# Patient Record
Sex: Male | Born: 1955 | Race: White | Hispanic: No | Marital: Married | Smoking: Never smoker
Health system: Southern US, Community
[De-identification: ages and names within clinical notes are randomized; demographics above are authoritative.]

## PROBLEM LIST (undated history)

## (undated) DIAGNOSIS — K219 Gastro-esophageal reflux disease without esophagitis: Secondary | ICD-10-CM

## (undated) DIAGNOSIS — H9313 Tinnitus, bilateral: Secondary | ICD-10-CM

## (undated) DIAGNOSIS — I10 Essential (primary) hypertension: Secondary | ICD-10-CM

## (undated) DIAGNOSIS — D126 Benign neoplasm of colon, unspecified: Secondary | ICD-10-CM

## (undated) HISTORY — DX: Essential (primary) hypertension: I10

## (undated) HISTORY — DX: Benign neoplasm of colon, unspecified: D12.6

## (undated) HISTORY — DX: Gastro-esophageal reflux disease without esophagitis: K21.9

## (undated) HISTORY — DX: Tinnitus, bilateral: H93.13

## (undated) HISTORY — PX: COLONOSCOPY: SHX174

---
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
## 2008-03-26 ENCOUNTER — Ambulatory Visit: Payer: Self-pay | Admitting: Gastroenterology

## 2008-04-07 ENCOUNTER — Encounter: Payer: Self-pay | Admitting: Gastroenterology

## 2008-04-07 ENCOUNTER — Ambulatory Visit: Payer: Self-pay | Admitting: Gastroenterology

## 2008-04-17 ENCOUNTER — Telehealth: Payer: Self-pay | Admitting: Gastroenterology

## 2008-05-28 DIAGNOSIS — Z860100 Personal history of colon polyps, unspecified: Secondary | ICD-10-CM | POA: Insufficient documentation

## 2008-05-28 DIAGNOSIS — K219 Gastro-esophageal reflux disease without esophagitis: Secondary | ICD-10-CM

## 2008-05-28 DIAGNOSIS — K5289 Other specified noninfective gastroenteritis and colitis: Secondary | ICD-10-CM | POA: Insufficient documentation

## 2008-05-29 ENCOUNTER — Ambulatory Visit: Payer: Self-pay | Admitting: Gastroenterology

## 2008-06-16 ENCOUNTER — Encounter (INDEPENDENT_AMBULATORY_CARE_PROVIDER_SITE_OTHER): Payer: Self-pay | Admitting: *Deleted

## 2009-02-19 ENCOUNTER — Encounter (INDEPENDENT_AMBULATORY_CARE_PROVIDER_SITE_OTHER): Payer: Self-pay | Admitting: *Deleted

## 2010-02-24 ENCOUNTER — Encounter (INDEPENDENT_AMBULATORY_CARE_PROVIDER_SITE_OTHER): Payer: Self-pay | Admitting: *Deleted

## 2011-01-18 NOTE — Letter (Signed)
Summary: Colonoscopy Letter  [facility]  [address] [place], [place] [address]   Phone: [phone]  Fax: [phone]      February 24, 2010 MRN: [id number]   [facility] [address] [place], [place]  [address]   Dear Mr. [facility],   According to your medical record, it is time for you to schedule a Colonoscopy. The American Cancer Society recommends this procedure as a method to detect early colon cancer. Patients with a family history of colon cancer, or a personal history of colon polyps or inflammatory bowel disease are at increased risk.  This letter has beeen generated based on the recommendations made at the time of your procedure. If you feel that in your particular situation this may no longer apply, please contact our office.  Please call our office at [phone] to schedule this appointment or to update your records at your earliest convenience.  Thank you for cooperating with [name] to provide you with the very best care possible.   Sincerely,   Vania Rea. Jarold Motto, M.D.  [facility] [phone]

## 2011-07-04 ENCOUNTER — Encounter: Payer: Self-pay | Admitting: *Deleted

## 2011-07-04 ENCOUNTER — Telehealth: Payer: Self-pay | Admitting: *Deleted

## 2011-07-04 NOTE — Telephone Encounter (Signed)
Colon scheduled for 08/15/2011 and pre visit on 07/25/2011, mailed pt information.

## 2011-07-25 ENCOUNTER — Ambulatory Visit (AMBULATORY_SURGERY_CENTER): Payer: BC Managed Care – PPO | Admitting: *Deleted

## 2011-07-25 ENCOUNTER — Encounter: Payer: Self-pay | Admitting: Gastroenterology

## 2011-07-25 VITALS — Ht 72.0 in | Wt 263.0 lb

## 2011-07-25 DIAGNOSIS — Z1211 Encounter for screening for malignant neoplasm of colon: Secondary | ICD-10-CM

## 2011-07-25 MED ORDER — PEG-KCL-NACL-NASULF-NA ASC-C 100 G PO SOLR: ORAL | Status: DC

## 2011-08-15 ENCOUNTER — Other Ambulatory Visit: Payer: Self-pay | Admitting: Gastroenterology

## 2011-08-24 ENCOUNTER — Encounter: Payer: Self-pay | Admitting: Gastroenterology

## 2011-08-24 ENCOUNTER — Ambulatory Visit (AMBULATORY_SURGERY_CENTER): Payer: BC Managed Care – PPO | Admitting: Gastroenterology

## 2011-08-24 VITALS — BP 114/59 | HR 69 | Temp 96.9°F | Resp 20 | Ht 72.0 in | Wt 248.0 lb

## 2011-08-24 DIAGNOSIS — D126 Benign neoplasm of colon, unspecified: Secondary | ICD-10-CM

## 2011-08-24 DIAGNOSIS — Z86010 Personal history of colonic polyps: Secondary | ICD-10-CM

## 2011-08-24 DIAGNOSIS — D129 Benign neoplasm of anus and anal canal: Secondary | ICD-10-CM

## 2011-08-24 DIAGNOSIS — Z1211 Encounter for screening for malignant neoplasm of colon: Secondary | ICD-10-CM

## 2011-08-24 DIAGNOSIS — D128 Benign neoplasm of rectum: Secondary | ICD-10-CM

## 2011-08-24 MED ORDER — SODIUM CHLORIDE 0.9 % IV SOLN: 500.0000 mL | INTRAVENOUS | Status: DC

## 2011-08-24 NOTE — Patient Instructions (Signed)
FOLLOW DISCHARGE INSTRUCTIONS (BLUE & GREEN SHEETS)   INFORMATION ON POLYPS GIVEN TO YOU.  

## 2011-08-25 ENCOUNTER — Telehealth: Payer: Self-pay

## 2011-08-25 NOTE — Telephone Encounter (Signed)

## 2011-08-30 ENCOUNTER — Encounter: Payer: Self-pay | Admitting: Gastroenterology

## 2012-01-18 ENCOUNTER — Ambulatory Visit (INDEPENDENT_AMBULATORY_CARE_PROVIDER_SITE_OTHER): Payer: BC Managed Care – PPO | Admitting: Internal Medicine

## 2012-01-18 ENCOUNTER — Encounter: Payer: Self-pay | Admitting: Internal Medicine

## 2012-01-18 VITALS — BP 143/85 | HR 84 | Temp 97.8°F | Resp 20 | Ht 71.5 in | Wt 251.6 lb

## 2012-01-18 DIAGNOSIS — G4700 Insomnia, unspecified: Secondary | ICD-10-CM

## 2012-01-18 DIAGNOSIS — Z0000 Encounter for general adult medical examination without abnormal findings: Secondary | ICD-10-CM

## 2012-01-18 DIAGNOSIS — E669 Obesity, unspecified: Secondary | ICD-10-CM

## 2012-01-18 DIAGNOSIS — H9319 Tinnitus, unspecified ear: Secondary | ICD-10-CM | POA: Insufficient documentation

## 2012-01-18 DIAGNOSIS — I10 Essential (primary) hypertension: Secondary | ICD-10-CM

## 2012-01-18 LAB — HEMOCCULT GUIAC POC 1CARD (OFFICE): Fecal Occult Blood, POC: POSITIVE

## 2012-01-18 LAB — CBC WITH DIFFERENTIAL/PLATELET
Basophils Relative: 0 % (ref 0–1)
Eosinophils Absolute: 0.1 10*3/uL (ref 0.0–0.7)
HCT: 45.9 % (ref 39.0–52.0)
Hemoglobin: 15.9 g/dL (ref 13.0–17.0)
MCH: 32.2 pg (ref 26.0–34.0)
MCHC: 34.6 g/dL (ref 30.0–36.0)
Monocytes Absolute: 0.6 10*3/uL (ref 0.1–1.0)
Monocytes Relative: 8 % (ref 3–12)
Neutro Abs: 4.4 10*3/uL (ref 1.7–7.7)

## 2012-01-18 MED ORDER — ZOLPIDEM TARTRATE 10 MG PO TABS: 10.0000 mg | ORAL_TABLET | ORAL | Status: DC | PRN

## 2012-01-18 MED ORDER — LISINOPRIL-HYDROCHLOROTHIAZIDE 10-12.5 MG PO TABS: 1.0000 | ORAL_TABLET | Freq: Every day | ORAL | Status: DC

## 2012-01-18 NOTE — Progress Notes (Signed)
Addended by: Johnnette Litter on: 01/18/2012 04:26 PM   Modules accepted: Orders

## 2012-01-18 NOTE — Progress Notes (Signed)
  Subjective:    Patient ID: Warren Hill, male    DOB: [birth year], 56 y.o.   MRN: [id number]  HPI Routine f/u doing well except only 10 lbs wtloss. Problems stable. Lumps on scalp. Off Omep via diet changes. Uses Ambien to reset sleep on weekends cause travels every week. Tinnitus driving him crazy(Dr. Dorma Russell). Home bps are wnl.   Review of Systems  HENT: Positive for tinnitus.   Respiratory: Negative for shortness of breath.   Cardiovascular: Negative for chest pain, palpitations and leg swelling.  Gastrointestinal: Negative for diarrhea, constipation and blood in stool.  All other systems reviewed and are negative.       Objective:   Physical Exam  Constitutional: He is oriented to person, place, and time. He appears well-developed and well-nourished.  HENT:  Head: Normocephalic.  Right Ear: External ear normal.  Left Ear: External ear normal.  Nose: Nose normal.  Mouth/Throat: Oropharynx is clear and moist.  Eyes: Conjunctivae and EOM are normal. Pupils are equal, round, and reactive to light.  Neck: Normal range of motion. Neck supple. No thyromegaly present.  Cardiovascular: Normal rate, regular rhythm, normal heart sounds and intact distal pulses.   No murmur heard. Pulmonary/Chest: Breath sounds normal.  Abdominal: Soft. Bowel sounds are normal. He exhibits no mass. There is no tenderness.  Genitourinary: Rectum normal, prostate normal and penis normal.  Musculoskeletal: Normal range of motion.  Neurological: He is alert and oriented to person, place, and time. He has normal reflexes.  Skin: Skin is warm and dry.   2 cysts are on scalp and stable Remains overweight  BP 143/85  Pulse 84  Temp(Src) 97.8 F (36.6 C) (Oral)  Resp 20  Ht 5' 11.5" (1.816 m)  Wt 251 lb 9.6 oz (114.125 kg)  BMI 34.60 kg/m2       Assessment & Plan:  CPE stable  htn  gerd resolved  Tinnitus-to [facility]  Scalp cysts-follow  Insomnia  Obesity-LOSE!  Hx macular degen  Hx  psoriasis Plan routine labs--no change in meds

## 2012-01-18 NOTE — Progress Notes (Signed)
Addended by: Johnnette Litter on: 01/18/2012 04:37 PM   Modules accepted: Orders

## 2012-01-19 ENCOUNTER — Encounter: Payer: Self-pay | Admitting: Internal Medicine

## 2012-01-19 LAB — COMPREHENSIVE METABOLIC PANEL
ALT: 31 U/L (ref 0–53)
CO2: 26 mEq/L (ref 19–32)
Calcium: 9.7 mg/dL (ref 8.4–10.5)
Chloride: 100 mEq/L (ref 96–112)
Creat: 0.87 mg/dL (ref 0.50–1.35)
Glucose, Bld: 83 mg/dL (ref 70–99)
Total Bilirubin: 1.1 mg/dL (ref 0.3–1.2)
Total Protein: 7.6 g/dL (ref 6.0–8.3)

## 2012-01-19 LAB — LIPID PANEL
Cholesterol: 219 mg/dL — ABNORMAL HIGH (ref 0–200)
HDL: 47 mg/dL (ref >39)
Total CHOL/HDL Ratio: 4.7 Ratio
VLDL: 54 mg/dL — ABNORMAL HIGH (ref 0–40)

## 2012-08-28 ENCOUNTER — Other Ambulatory Visit: Payer: Self-pay | Admitting: Family Medicine

## 2012-08-28 DIAGNOSIS — G4700 Insomnia, unspecified: Secondary | ICD-10-CM

## 2012-08-28 MED ORDER — ZOLPIDEM TARTRATE 10 MG PO TABS: 10.0000 mg | ORAL_TABLET | ORAL | Status: DC | PRN

## 2012-10-03 ENCOUNTER — Ambulatory Visit (INDEPENDENT_AMBULATORY_CARE_PROVIDER_SITE_OTHER): Payer: BC Managed Care – PPO | Admitting: Family Medicine

## 2012-10-03 VITALS — BP 108/72 | HR 91 | Temp 98.0°F | Resp 16 | Ht 72.0 in | Wt 265.0 lb

## 2012-10-03 DIAGNOSIS — M940 Chondrocostal junction syndrome [Tietze]: Secondary | ICD-10-CM

## 2012-10-03 DIAGNOSIS — J411 Mucopurulent chronic bronchitis: Secondary | ICD-10-CM

## 2012-10-03 DIAGNOSIS — R292 Abnormal reflex: Secondary | ICD-10-CM

## 2012-10-03 MED ORDER — PANTOPRAZOLE SODIUM 40 MG PO TBEC: 40.0000 mg | DELAYED_RELEASE_TABLET | Freq: Every day | ORAL | Status: DC

## 2012-10-03 MED ORDER — ACETAMINOPHEN 500 MG PO TABS: 1000.0000 mg | ORAL_TABLET | Freq: Three times a day (TID) | ORAL | Status: DC | PRN

## 2012-10-03 NOTE — Progress Notes (Signed)
  Subjective:    Patient ID: Warren Hill, male    DOB: 04-30-1956, 56 y.o.   MRN: [id number]  HPI Pt presents today with chest discomfort x 1 week.  Predominantly R sided, sometimes associated with deep breathing and movement.  Pt reports moving some heavy furniture 1-2 weeks ago. Has had similar sxs in the past.  No radiation to L side of chest. No SOB or diaphoresis.   No prior hx/o CAD or MI. No family hx/o MI.  Has also had some indigestion and reflux sxs.  Previously treated for reflux in the past. However, has been off of medication for extended period of time.  Pt does travel regularly and has somewhat of bad diet.     Review of Systems See HPI, otherwise ROS negative     Objective:   Physical Exam Gen: up in chair, NAD HEENT: NCAT, EOMI, TMs clear bilaterally CV: RRR, no murmurs auscultated, + TTP across R anterior chest wall.  PULM: CTAB, no wheezes, rales, rhoncii ABD: S/NT/+ bowel sounds, obese abdomen, no epigastric tenderness EXT: 2+ peripheral pulses         Assessment & Plan:  Chest discomfort:  Most consistent with costochondritis. Will treat with tylenol. Avoid NSAIDs given underlying reflux. Discussed cardiorespiratory red flags including SOB, worsening CP.  Wells score 0.   Reflux: Will restart on ppi.  Discussed improved diet.

## 2012-10-09 ENCOUNTER — Encounter: Payer: Self-pay | Admitting: Family Medicine

## 2012-12-14 ENCOUNTER — Telehealth: Payer: Self-pay | Admitting: *Deleted

## 2012-12-14 DIAGNOSIS — G4700 Insomnia, unspecified: Secondary | ICD-10-CM

## 2012-12-14 NOTE — Telephone Encounter (Signed)
Pharmacy requesting refill on Ambien 10mg

## 2012-12-16 MED ORDER — ZOLPIDEM TARTRATE 10 MG PO TABS: 10.0000 mg | ORAL_TABLET | ORAL | Status: DC | PRN

## 2012-12-16 NOTE — Telephone Encounter (Signed)
Meds ordered this encounter  Medications  . zolpidem (AMBIEN) 10 MG tablet    Sig: Take 1 tablet (10 mg total) by mouth as needed. sleep    Dispense:  30 tablet    Refill:  0

## 2012-12-21 ENCOUNTER — Other Ambulatory Visit: Payer: Self-pay | Admitting: Internal Medicine

## 2013-01-13 ENCOUNTER — Other Ambulatory Visit: Payer: Self-pay | Admitting: Internal Medicine

## 2013-02-11 ENCOUNTER — Other Ambulatory Visit: Payer: Self-pay | Admitting: Internal Medicine

## 2013-02-11 NOTE — Telephone Encounter (Signed)
Forward to Dr. Merla Riches.

## 2013-02-15 ENCOUNTER — Other Ambulatory Visit: Payer: Self-pay | Admitting: Physician Assistant

## 2013-03-29 ENCOUNTER — Ambulatory Visit (INDEPENDENT_AMBULATORY_CARE_PROVIDER_SITE_OTHER): Payer: BC Managed Care – PPO | Admitting: Internal Medicine

## 2013-03-29 VITALS — BP 124/68 | HR 78 | Temp 97.8°F | Resp 16 | Ht 72.25 in | Wt 253.4 lb

## 2013-03-29 DIAGNOSIS — L989 Disorder of the skin and subcutaneous tissue, unspecified: Secondary | ICD-10-CM

## 2013-03-29 DIAGNOSIS — I10 Essential (primary) hypertension: Secondary | ICD-10-CM

## 2013-03-29 DIAGNOSIS — G4700 Insomnia, unspecified: Secondary | ICD-10-CM

## 2013-03-29 DIAGNOSIS — S46212A Strain of muscle, fascia and tendon of other parts of biceps, left arm, initial encounter: Secondary | ICD-10-CM

## 2013-03-29 MED ORDER — ZOLPIDEM TARTRATE 10 MG PO TABS: ORAL_TABLET | ORAL | Status: DC

## 2013-03-29 MED ORDER — LISINOPRIL-HYDROCHLOROTHIAZIDE 10-12.5 MG PO TABS: 1.0000 | ORAL_TABLET | Freq: Every day | ORAL | Status: DC

## 2013-03-29 NOTE — Progress Notes (Signed)
  Subjective:    Patient ID: Warren Hill, male    DOB: 04/18/1956, 57 y.o.   MRN: [id number]  HPI almost out of meds Physical in June Still needs Ambien because of travel schedule but does well Doing well with blood pressure Able to discontinue Protonix after he went on a diet avoiding all white foods including beer/reflux has resolved  New skin lesion on face x6 months Working in the yard and left biceps no sore    Review of Systems Negative    Objective:   Physical Exam Blood pressure 124/68 Hyperpigmented lesion left temporal area? Superficial spreading melanoma Tender in the left biceps without defect       Assessment & Plan:  HTN (hypertension) - Plan: lisinopril-hydrochlorothiazide (PRINZIDE,ZESTORETIC) 10-12.5 MG per tablet  Insomnia - Plan: zolpidem (AMBIEN) 10 MG tablet  Skin lesion of face--referred to Dr. Burnis Kingfisher strain, left, initial encounter//home physical therapy  cpe june

## 2013-06-05 ENCOUNTER — Encounter: Payer: Self-pay | Admitting: Internal Medicine

## 2013-06-05 ENCOUNTER — Ambulatory Visit (INDEPENDENT_AMBULATORY_CARE_PROVIDER_SITE_OTHER): Payer: BC Managed Care – PPO | Admitting: Internal Medicine

## 2013-06-05 VITALS — BP 108/64 | HR 80 | Temp 98.2°F | Resp 16 | Ht 71.5 in | Wt 244.0 lb

## 2013-06-05 DIAGNOSIS — I10 Essential (primary) hypertension: Secondary | ICD-10-CM

## 2013-06-05 DIAGNOSIS — M25512 Pain in left shoulder: Secondary | ICD-10-CM

## 2013-06-05 DIAGNOSIS — L409 Psoriasis, unspecified: Secondary | ICD-10-CM

## 2013-06-05 DIAGNOSIS — Z0000 Encounter for general adult medical examination without abnormal findings: Secondary | ICD-10-CM

## 2013-06-05 DIAGNOSIS — H9313 Tinnitus, bilateral: Secondary | ICD-10-CM

## 2013-06-05 DIAGNOSIS — G4700 Insomnia, unspecified: Secondary | ICD-10-CM

## 2013-06-05 DIAGNOSIS — Z23 Encounter for immunization: Secondary | ICD-10-CM

## 2013-06-05 LAB — CBC WITH DIFFERENTIAL/PLATELET
Eosinophils Absolute: 0.1 10*3/uL (ref 0.0–0.7)
Hemoglobin: 14.3 g/dL (ref 13.0–17.0)
Lymphocytes Relative: 26 % (ref 12–46)
Lymphs Abs: 1.3 10*3/uL (ref 0.7–4.0)
MCH: 31.6 pg (ref 26.0–34.0)
MCV: 90 fL (ref 78.0–100.0)
Monocytes Relative: 9 % (ref 3–12)
Neutrophils Relative %: 64 % (ref 43–77)
RBC: 4.52 MIL/uL (ref 4.22–5.81)
WBC: 5.1 10*3/uL (ref 4.0–10.5)

## 2013-06-05 LAB — POCT URINALYSIS DIPSTICK
Bilirubin, UA: NEGATIVE
Blood, UA: NEGATIVE
Glucose, UA: NEGATIVE
Spec Grav, UA: 1.015

## 2013-06-05 LAB — IFOBT (OCCULT BLOOD): IFOBT: POSITIVE

## 2013-06-05 MED ORDER — ZOLPIDEM TARTRATE 10 MG PO TABS: ORAL_TABLET | ORAL | Status: DC

## 2013-06-05 MED ORDER — CIPROFLOXACIN HCL 500 MG PO TABS: 500.0000 mg | ORAL_TABLET | Freq: Two times a day (BID) | ORAL | Status: DC

## 2013-06-05 MED ORDER — MELOXICAM 15 MG PO TABS: 15.0000 mg | ORAL_TABLET | Freq: Every day | ORAL | Status: DC

## 2013-06-05 MED ORDER — LISINOPRIL-HYDROCHLOROTHIAZIDE 10-12.5 MG PO TABS: 1.0000 | ORAL_TABLET | Freq: Every day | ORAL | Status: DC

## 2013-06-06 ENCOUNTER — Encounter: Payer: Self-pay | Admitting: Internal Medicine

## 2013-06-06 LAB — COMPREHENSIVE METABOLIC PANEL
ALT: 22 U/L (ref 0–53)
Albumin: 4.5 g/dL (ref 3.5–5.2)
CO2: 28 mEq/L (ref 19–32)
Calcium: 9.5 mg/dL (ref 8.4–10.5)
Chloride: 99 mEq/L (ref 96–112)
Glucose, Bld: 93 mg/dL (ref 70–99)
Sodium: 134 mEq/L — ABNORMAL LOW (ref 135–145)
Total Bilirubin: 1 mg/dL (ref 0.3–1.2)
Total Protein: 7 g/dL (ref 6.0–8.3)

## 2013-06-06 LAB — LIPID PANEL
Cholesterol: 159 mg/dL (ref 0–200)
Total CHOL/HDL Ratio: 3.5 Ratio

## 2013-06-06 LAB — PSA: PSA: 0.89 ng/mL (ref <=4.00)

## 2013-06-06 NOTE — Progress Notes (Signed)
  Subjective:    Patient ID: Warren Hill, male    DOB: [birth year], 57 y.o.   MRN: [id number]  HPIPE Continues w/ L shoulder pain/some nocturnal, some with use--see 4/14 Cont to travel extensively w/ work Has lost 10lbs since 4/14 ov Patient Active Problem List   Diagnosis Date Noted  . HTN (hypertension) 01/18/2012    Priority: Medium  . Insomnia--caused by internat travel 01/18/2012    Priority: Medium  . Tinnitus--Dr Dorma Russell sending for aides due to assoc hearing loss 01/18/2012  . GERD---resolved by eliminating all white foods 05/28/2008  . COLITIShx of/stable 05/28/2008  . COLONIC POLYPS, ADENOMATOUS, HX OF 05/28/2008    -  Psoriasis-Dr Gruber//mainly perineal perirec w/ superimp yeast   - face lesion to be checked by Dr Danella Deis  Needs zostavax Other HM UTD  Review of Systems  Constitutional: Negative for fever, activity change, appetite change and fatigue.  HENT: Positive for hearing loss and tinnitus. Negative for trouble swallowing and neck pain.   Eyes: Negative for visual disturbance.  Respiratory: Negative for cough and shortness of breath.   Cardiovascular: Negative for chest pain and palpitations.  Gastrointestinal: Negative for abdominal pain, diarrhea and constipation.  Genitourinary: Negative for difficulty urinating.  Musculoskeletal: Negative for joint swelling.  Skin: Negative for rash.  Neurological: Negative for light-headedness and headaches.  Hematological: Does not bruise/bleed easily.  Psychiatric/Behavioral: Negative for dysphoric mood.       Objective:   Physical Exam BP 108/64  Pulse 80  Temp(Src) 98.2 F (36.8 C)  Resp 16  Ht 5' 11.5" (1.816 m)  Wt 244 lb (110.678 kg)  BMI 33.56 kg/m2 HEENT clear Ht reg w/out m No bruits Lungs clear abd supple Prostate symm soft no nodules Redness w/ plaques perirectal L should pain at 75 abd and mildly ext rot Pain w/ resis anteriorly     Assessment & Plan:  Routine general medical examination at a  health care facility - Plan: POCT urinalysis dipstick, CBC with Differential, Comprehensive metabolic panel, PSA, Lipid panel, IFOBT POC (occult bld, rslt in office)  HTN (hypertension) - Plan: lisinopril-hydrochlorothiazide (PRINZIDE,ZESTORETIC) 10-12.5 MG per tablet, Comprehensive metabolic panel, Lipid panel  Insomnia - Plan: zolpidem (AMBIEN) 10 MG tablet  Pain in joint, shoulder region, left---exercises given//if no chg 1 mo call for PT referral//mobic 15 daily  Encounter for Zostavax administration  Psoriasis  Tinnitus, bilateral  Meds ordered this encounter  Medications  . lisinopril-hydrochlorothiazide (PRINZIDE,ZESTORETIC) 10-12.5 MG per tablet    Sig: Take 1 tablet by mouth daily.    Dispense:  90 tablet    Refill:  3  . zolpidem (AMBIEN) 10 MG tablet    Sig: TAKE 1 TABLET BY MOUTH AS NEEDED FOR SLEEP    Dispense:  30 tablet    Refill:  5  . meloxicam (MOBIC) 15 MG tablet    Sig: Take 1 tablet (15 mg total) by mouth daily.    Dispense:  30 tablet    Refill:  0

## 2013-07-01 ENCOUNTER — Other Ambulatory Visit: Payer: Self-pay | Admitting: Dermatology

## 2014-03-11 ENCOUNTER — Other Ambulatory Visit: Payer: Self-pay | Admitting: Internal Medicine

## 2014-03-12 ENCOUNTER — Other Ambulatory Visit: Payer: Self-pay | Admitting: Internal Medicine

## 2014-03-13 ENCOUNTER — Other Ambulatory Visit: Payer: Self-pay | Admitting: Internal Medicine

## 2014-03-18 ENCOUNTER — Ambulatory Visit (INDEPENDENT_AMBULATORY_CARE_PROVIDER_SITE_OTHER): Payer: BC Managed Care – PPO | Admitting: Internal Medicine

## 2014-03-18 VITALS — BP 120/72 | HR 80 | Temp 97.8°F | Resp 18 | Ht 71.5 in | Wt 260.4 lb

## 2014-03-18 DIAGNOSIS — Z7189 Other specified counseling: Secondary | ICD-10-CM

## 2014-03-18 DIAGNOSIS — G4700 Insomnia, unspecified: Secondary | ICD-10-CM

## 2014-03-18 MED ORDER — ZOLPIDEM TARTRATE 10 MG PO TABS: ORAL_TABLET | ORAL | Status: DC

## 2014-03-18 NOTE — Patient Instructions (Signed)

## 2014-03-22 ENCOUNTER — Encounter: Payer: Self-pay | Admitting: Internal Medicine

## 2014-03-22 NOTE — Progress Notes (Signed)
   Subjective:    Patient ID: Warren Hill, male    DOB: Apr 15, 1956, 58 y.o.   MRN: [id number]  HPI  Luz Lex a lot all over the world.  Review of Systems     Objective:   Physical Exam  Constitutional: He is oriented to person, place, and time. He appears well-developed and well-nourished. No distress.  HENT:  Head: Normocephalic.  Eyes: EOM are normal.  Cardiovascular: Normal rate.   Pulmonary/Chest: Effort normal.  Musculoskeletal: Normal range of motion.  Neurological: He is alert and oriented to person, place, and time. He exhibits normal muscle tone. Coordination normal.  Psychiatric: He has a normal mood and affect. His behavior is normal. Judgment and thought content normal.          Assessment & Plan:  Insomnia Zolpidem prn

## 2014-05-24 ENCOUNTER — Other Ambulatory Visit: Payer: Self-pay | Admitting: Internal Medicine

## 2014-08-06 ENCOUNTER — Encounter: Payer: Self-pay | Admitting: Internal Medicine

## 2014-08-06 ENCOUNTER — Ambulatory Visit (INDEPENDENT_AMBULATORY_CARE_PROVIDER_SITE_OTHER): Payer: BC Managed Care – PPO | Admitting: Internal Medicine

## 2014-08-06 VITALS — BP 115/76 | HR 73 | Temp 97.8°F | Resp 16 | Ht 71.5 in | Wt 255.0 lb

## 2014-08-06 DIAGNOSIS — I10 Essential (primary) hypertension: Secondary | ICD-10-CM

## 2014-08-06 DIAGNOSIS — H9319 Tinnitus, unspecified ear: Secondary | ICD-10-CM

## 2014-08-06 DIAGNOSIS — G4700 Insomnia, unspecified: Secondary | ICD-10-CM

## 2014-08-06 DIAGNOSIS — IMO0001 Reserved for inherently not codable concepts without codable children: Secondary | ICD-10-CM

## 2014-08-06 DIAGNOSIS — Z23 Encounter for immunization: Secondary | ICD-10-CM

## 2014-08-06 DIAGNOSIS — H9190 Unspecified hearing loss, unspecified ear: Secondary | ICD-10-CM

## 2014-08-06 DIAGNOSIS — Z0000 Encounter for general adult medical examination without abnormal findings: Secondary | ICD-10-CM

## 2014-08-06 DIAGNOSIS — K219 Gastro-esophageal reflux disease without esophagitis: Secondary | ICD-10-CM

## 2014-08-06 LAB — CBC WITH DIFFERENTIAL/PLATELET
Basophils Absolute: 0 10*3/uL (ref 0.0–0.1)
Basophils Relative: 0 % (ref 0–1)
EOS PCT: 1 % (ref 0–5)
Eosinophils Absolute: 0.1 10*3/uL (ref 0.0–0.7)
HEMATOCRIT: 43.3 % (ref 39.0–52.0)
Hemoglobin: 15.3 g/dL (ref 13.0–17.0)
LYMPHS PCT: 23 % (ref 12–46)
Lymphs Abs: 1.8 10*3/uL (ref 0.7–4.0)
MCH: 32 pg (ref 26.0–34.0)
MCHC: 35.3 g/dL (ref 30.0–36.0)
MCV: 90.6 fL (ref 78.0–100.0)
MONO ABS: 0.8 10*3/uL (ref 0.1–1.0)
Monocytes Relative: 10 % (ref 3–12)
Neutro Abs: 5.1 10*3/uL (ref 1.7–7.7)
Neutrophils Relative %: 66 % (ref 43–77)
Platelets: 167 10*3/uL (ref 150–400)
RBC: 4.78 MIL/uL (ref 4.22–5.81)
RDW: 13.6 % (ref 11.5–15.5)
WBC: 7.7 10*3/uL (ref 4.0–10.5)

## 2014-08-06 LAB — POCT URINALYSIS DIPSTICK
BILIRUBIN UA: NEGATIVE
Blood, UA: NEGATIVE
Glucose, UA: NEGATIVE
Ketones, UA: NEGATIVE
LEUKOCYTES UA: NEGATIVE
Nitrite, UA: NEGATIVE
PH UA: 6.5
PROTEIN UA: NEGATIVE
Urobilinogen, UA: 0.2

## 2014-08-06 LAB — COMPLETE METABOLIC PANEL WITH GFR
ALBUMIN: 4.6 g/dL (ref 3.5–5.2)
ALT: 32 U/L (ref 0–53)
AST: 24 U/L (ref 0–37)
Alkaline Phosphatase: 51 U/L (ref 39–117)
BUN: 19 mg/dL (ref 6–23)
CALCIUM: 9.5 mg/dL (ref 8.4–10.5)
CHLORIDE: 99 meq/L (ref 96–112)
CO2: 26 mEq/L (ref 19–32)
Creat: 0.86 mg/dL (ref 0.50–1.35)
GLUCOSE: 85 mg/dL (ref 70–99)
POTASSIUM: 4.2 meq/L (ref 3.5–5.3)
Sodium: 136 mEq/L (ref 135–145)
Total Bilirubin: 1.1 mg/dL (ref 0.2–1.2)
Total Protein: 7.2 g/dL (ref 6.0–8.3)

## 2014-08-06 LAB — LIPID PANEL
Cholesterol: 193 mg/dL (ref 0–200)
HDL: 46 mg/dL (ref >39)
LDL Cholesterol: 108 mg/dL — ABNORMAL HIGH (ref 0–99)
Total CHOL/HDL Ratio: 4.2 Ratio
Triglycerides: 197 mg/dL — ABNORMAL HIGH (ref <150)
VLDL: 39 mg/dL (ref 0–40)

## 2014-08-06 NOTE — Progress Notes (Signed)
Subjective:    Patient ID: Warren Hill, male    DOB: 01-13-1956, 58 y.o.   MRN: [id number] This chart was scribed for Leandrew Koyanagi, MD by Rosary Lively, ED scribe. This patient was seen in room Room/bed 28 and the patient's care was started at 11:23 AM.   HPI HPI Comments:  Warren Hill is a 58 y.o. male who presents to [facility] for a physical exam. Pt expresses concerns involving ringing of the ears, breathing, a swollen area on the scalp, yeast infection, congestion, and ear wax. Pt reports that he frequently travels and is going to [place] on 09/08/2014.  Ringing in the ears: Pt has experienced ringing in the ears, and states that he now has hearing aids, which has helped. He hears the ringing constantly, and it even wakes him at night.  Not breathing: Pt reports that he catches himself not breathing during the day. Pt denies waking at night from not breathing, but does experience numbness of the extremities, which may be a result of position. No sleep apnea symptoms.  Swollen Area on Head: Pt reports that the area is located behind the left ear, and that he experiences soreness. Pt states that he only gets these areas in the scalp. He has had several removed-cysts.  Yeast Infection: Pt reports that he has taken Monistat 7, along with a low dose of hydrocortisone, and reports that it does help. He has also taken Prednisone for the issue. Pt reports that he believes that his sphincter has become a little weaker, and experiences a minimal amount of soilage even after cleaning, which has also contributed to issue. Dermatology Dr. Tonia Brooms manages this.  Congestion: Pt reports that he has experienced increased congestion and drainage, however there may be a connection to issues with GERD. His reflux however is minimal his control with 1-2 doses a week Pepcid 10.  Ear wax: Pt concerned of build-up with hearing aids.  Pt accepts flu shot.  Work continues to be interesting. 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 Travels around the world. Health maintenance issues stable  Review of Systems  HENT:       Ringing in ears  Genitourinary:       Yeast  Skin:       Area of concern behind left ear, and on upper right arm.   remainder of the review systems is negative     Objective:   Physical Exam  Nursing note and vitals reviewed. Constitutional: He is oriented to person, place, and time. He appears well-developed and well-nourished.  HENT:  Head: Normocephalic and atraumatic.  Right Ear: External ear normal.  Left Ear: External ear normal.  Nose: Nose normal.  Mouth/Throat: Oropharynx is clear and moist.  Tms and canals clear  Eyes: Conjunctivae and EOM are normal. Pupils are equal, round, and reactive to light.  Neck: Normal range of motion. Neck supple. No thyromegaly present.  Cardiovascular: Normal rate, regular rhythm, normal heart sounds and intact distal pulses.   No murmur heard. Pulmonary/Chest: Effort normal and breath sounds normal. No respiratory distress. He has no wheezes. He has no rales.  Abdominal: Soft. Bowel sounds are normal. He exhibits no distension and no mass. There is no tenderness. There is no rebound and no guarding.  No hepatosplenomegaly  Musculoskeletal: Normal range of motion. He exhibits no edema and no tenderness.  Lymphadenopathy:    He has no cervical adenopathy.  Neurological: He is alert and oriented to person, place, and  time. He has normal reflexes. No cranial nerve deficit. He exhibits normal muscle tone. Coordination normal.  Skin: Skin is warm and dry. No rash noted.  Psychiatric: He has a normal mood and affect. His behavior is normal. Judgment and thought content normal.   Wt Readings from Last 3 Encounters:  08/06/14 255 lb (115.667 kg)  03/18/14 260 lb 6.4 oz (118.117 kg)  06/05/13 244 lb (110.678 kg)   BP 115/76  Pulse 73  Temp(Src) 97.8 F (36.6 C)  Resp 16  Ht 5' 11.5" (1.816 m)  Wt 255 lb (115.667 kg)  BMI 35.07  kg/m2  SpO2 97%        Assessment & Plan:  Routine general medical examination at a health care facility - Plan: POCT urinalysis dipstick, CBC with Differential, COMPLETE METABOLIC PANEL WITH GFR, Lipid panel, PSA  Insomnia  Essential hypertension - Plan: CBC with Differential, COMPLETE METABOLIC PANEL WITH GFR, Lipid panel, PSA  Reflux - Plan: COMPLETE METABOLIC PANEL WITH GFR, Lipid panel  Hearing loss, unspecified laterality  Tinnitus, unspecified laterality  Needs flu shot - Plan: Flu Vaccine QUAD 36+ mos IM   Meds ordered this encounter  Medications  . famotidine-calcium carbonate-magnesium hydroxide (PEPCID COMPLETE) 10-800-165 MG CHEW chewable tablet    Sig: Chew 1 tablet by mouth daily as needed.  Marland Kitchen lisinopril-hydrochlorothiazide (PRINZIDE,ZESTORETIC) 10-12.5 MG per tablet    Sig: TAKE 1 TABLET BY MOUTH DAILY.    Dispense:  90 tablet    Refill:  3  . ciprofloxacin (CIPRO) 500 MG tablet    Sig: Take 1 tablet (500 mg total) by mouth 2 (two) times daily.    Dispense:  20 tablet    Refill:  0   Call if needs ambien for flying  I have completed the patient encounter in its entirety as documented by the scribe, with editing by me where necessary. Dameka Younker P. Laney Pastor, M.D.

## 2014-08-07 LAB — PSA: PSA: 1.17 ng/mL (ref <=4.00)

## 2014-08-07 MED ORDER — CIPROFLOXACIN HCL 500 MG PO TABS: 500.0000 mg | ORAL_TABLET | Freq: Two times a day (BID) | ORAL | Status: DC

## 2014-08-07 MED ORDER — LISINOPRIL-HYDROCHLOROTHIAZIDE 10-12.5 MG PO TABS: ORAL_TABLET | ORAL | Status: DC

## 2014-08-08 ENCOUNTER — Encounter: Payer: Self-pay | Admitting: Internal Medicine

## 2014-10-20 ENCOUNTER — Other Ambulatory Visit: Payer: Self-pay | Admitting: Internal Medicine

## 2014-10-20 NOTE — Telephone Encounter (Signed)
Faxed

## 2015-04-28 ENCOUNTER — Other Ambulatory Visit: Payer: Self-pay | Admitting: Internal Medicine

## 2015-04-29 NOTE — Telephone Encounter (Signed)
Rx called in to pharmacy. 

## 2015-05-01 ENCOUNTER — Telehealth: Payer: Self-pay

## 2015-05-01 NOTE — Telephone Encounter (Signed)
Rx phoned in for Ambien.

## 2015-06-25 ENCOUNTER — Encounter: Payer: Self-pay | Admitting: Gastroenterology

## 2015-08-05 ENCOUNTER — Other Ambulatory Visit: Payer: Self-pay | Admitting: Internal Medicine

## 2015-09-05 ENCOUNTER — Other Ambulatory Visit: Payer: Self-pay | Admitting: Internal Medicine

## 2015-09-18 ENCOUNTER — Telehealth: Payer: Self-pay | Admitting: Family Medicine

## 2015-09-18 NOTE — Telephone Encounter (Signed)
lmom of patient new appt date with Laney Pastor with is 10/16/15 at 11:45

## 2015-10-14 ENCOUNTER — Encounter: Payer: Self-pay | Admitting: Internal Medicine

## 2015-10-16 ENCOUNTER — Encounter: Payer: Self-pay | Admitting: Internal Medicine

## 2015-10-16 ENCOUNTER — Ambulatory Visit (INDEPENDENT_AMBULATORY_CARE_PROVIDER_SITE_OTHER): Payer: Managed Care, Other (non HMO) | Admitting: Internal Medicine

## 2015-10-16 VITALS — BP 132/81 | HR 79 | Temp 97.9°F | Resp 16 | Ht 72.0 in | Wt 272.0 lb

## 2015-10-16 DIAGNOSIS — I10 Essential (primary) hypertension: Secondary | ICD-10-CM | POA: Diagnosis not present

## 2015-10-16 DIAGNOSIS — Z0000 Encounter for general adult medical examination without abnormal findings: Secondary | ICD-10-CM | POA: Diagnosis not present

## 2015-10-16 DIAGNOSIS — Z23 Encounter for immunization: Secondary | ICD-10-CM

## 2015-10-16 DIAGNOSIS — K2100 Gastro-esophageal reflux disease with esophagitis, without bleeding: Secondary | ICD-10-CM

## 2015-10-16 DIAGNOSIS — G4700 Insomnia, unspecified: Secondary | ICD-10-CM

## 2015-10-16 LAB — COMPREHENSIVE METABOLIC PANEL
ALBUMIN: 4.4 g/dL (ref 3.6–5.1)
ALK PHOS: 56 U/L (ref 40–115)
ALT: 35 U/L (ref 9–46)
AST: 26 U/L (ref 10–35)
BILIRUBIN TOTAL: 1.2 mg/dL (ref 0.2–1.2)
BUN: 15 mg/dL (ref 7–25)
CALCIUM: 9.3 mg/dL (ref 8.6–10.3)
CO2: 23 mmol/L (ref 20–31)
CREATININE: 0.78 mg/dL (ref 0.70–1.25)
Chloride: 102 mmol/L (ref 98–110)
Glucose, Bld: 82 mg/dL (ref 65–99)
Potassium: 3.9 mmol/L (ref 3.5–5.3)
SODIUM: 136 mmol/L (ref 135–146)
TOTAL PROTEIN: 7.1 g/dL (ref 6.1–8.1)

## 2015-10-16 LAB — CBC WITH DIFFERENTIAL/PLATELET
BASOS PCT: 0 % (ref 0–1)
Basophils Absolute: 0 10*3/uL (ref 0.0–0.1)
Eosinophils Absolute: 0.1 10*3/uL (ref 0.0–0.7)
Eosinophils Relative: 1 % (ref 0–5)
HEMATOCRIT: 44.2 % (ref 39.0–52.0)
HEMOGLOBIN: 15.4 g/dL (ref 13.0–17.0)
LYMPHS PCT: 22 % (ref 12–46)
Lymphs Abs: 1.7 10*3/uL (ref 0.7–4.0)
MCH: 32.2 pg (ref 26.0–34.0)
MCHC: 34.8 g/dL (ref 30.0–36.0)
MCV: 92.5 fL (ref 78.0–100.0)
MONOS PCT: 8 % (ref 3–12)
MPV: 11.6 fL (ref 8.6–12.4)
Monocytes Absolute: 0.6 10*3/uL (ref 0.1–1.0)
NEUTROS ABS: 5.5 10*3/uL (ref 1.7–7.7)
NEUTROS PCT: 69 % (ref 43–77)
Platelets: 175 10*3/uL (ref 150–400)
RBC: 4.78 MIL/uL (ref 4.22–5.81)
RDW: 13.3 % (ref 11.5–15.5)
WBC: 7.9 10*3/uL (ref 4.0–10.5)

## 2015-10-16 LAB — LIPID PANEL
CHOLESTEROL: 203 mg/dL — AB (ref 125–200)
HDL: 41 mg/dL (ref >=40)
LDL CALC: 116 mg/dL (ref <130)
TRIGLYCERIDES: 231 mg/dL — AB (ref <150)
Total CHOL/HDL Ratio: 5 Ratio (ref <=5.0)
VLDL: 46 mg/dL — ABNORMAL HIGH (ref <30)

## 2015-10-16 LAB — HIV ANTIBODY (ROUTINE TESTING W REFLEX): HIV 1&2 Ab, 4th Generation: NONREACTIVE

## 2015-10-16 MED ORDER — ZOLPIDEM TARTRATE 10 MG PO TABS: ORAL_TABLET | ORAL | Status: DC

## 2015-10-16 MED ORDER — LISINOPRIL-HYDROCHLOROTHIAZIDE 10-12.5 MG PO TABS: ORAL_TABLET | ORAL | Status: DC

## 2015-10-16 MED ORDER — CIPROFLOXACIN HCL 500 MG PO TABS: 500.0000 mg | ORAL_TABLET | Freq: Two times a day (BID) | ORAL | Status: DC

## 2015-10-17 LAB — PSA: PSA: 0.94 ng/mL (ref <=4.00)

## 2015-10-17 LAB — HEPATITIS C ANTIBODY: HCV AB: NEGATIVE

## 2015-10-18 NOTE — Progress Notes (Addendum)
Subjective:    Patient ID: Warren Hill, male    DOB: July 25, 1956, 59 y.o.   MRN: [id number]  HPIannual Patient Active Problem List   Diagnosis Date Noted  . HTN (hypertension) 01/18/2012    Priority: Medium  . Insomnia 01/18/2012    Priority: Medium  . Tinnitus 01/18/2012  . GERD 05/28/2008  . COLITIS 05/28/2008  . COLONIC POLYPS, ADENOMATOUS, HX OF 05/28/2008   New ? -snoring/no observ apnea but wife has to sleep in another room//no day hypsomn. 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 Nothing about this interferes with daytime activity. He declares no neck pain. He is not very flexible in his activity level is minimal because of his travel.  HM-utd  Fh/sh unchg  Review of Systems 14pt neg other than PI Tinnitus continues to be significant ENT evaluation offered no help Occasional allergies     Objective:   Physical Exam  Constitutional: He is oriented to person, place, and time. He appears well-developed and well-nourished.  obese  HENT:  Head: Normocephalic and atraumatic.  Right Ear: Hearing, tympanic membrane, external ear and ear canal normal.  Left Ear: Hearing, tympanic membrane, external ear and ear canal normal.  Nose: Nose normal.  Mouth/Throat: Uvula is midline, oropharynx is clear and moist and mucous membranes are normal.  Shallow hypophar  Eyes: Conjunctivae, EOM and lids are normal. Pupils are equal, round, and reactive to light. Right eye exhibits no discharge. Left eye exhibits no discharge. No scleral icterus.  Neck: Trachea normal and normal range of motion. Neck supple. Carotid bruit is not present. No thyromegaly present.  Cardiovascular: Normal rate, regular rhythm, normal heart sounds, intact distal pulses and normal pulses.   No murmur  heard. Pulmonary/Chest: Effort normal and breath sounds normal. No respiratory distress. He has no wheezes. He has no rhonchi. He has no rales.  Abdominal: Soft. Normal appearance and bowel sounds are normal. He exhibits no abdominal bruit. There is no tenderness.  Musculoskeletal: Normal range of motion. He exhibits no edema or tenderness.  Lymphadenopathy:       Head (right side): No submental, no submandibular, no tonsillar, no preauricular, no posterior auricular and no occipital adenopathy present.       Head (left side): No submental, no submandibular, no tonsillar, no preauricular, no posterior auricular and no occipital adenopathy present.    He has no cervical adenopathy.  Neurological: He is alert and oriented to person, place, and time. He has normal strength and normal reflexes. No cranial nerve deficit or sensory deficit. Coordination and gait normal.  Skin: Skin is warm, dry and intact. No lesion and no rash noted.  Psychiatric: He has a normal mood and affect. His speech is normal and behavior is normal. Judgment and thought content normal.  BP 132/81 mmHg  Pulse 79  Temp(Src) 97.9 F (36.6 C)  Resp 16  Ht 6' (1.829 m)  Wt 272 lb (123.378 kg)  BMI 36.88 kg/m2 Wt Readings from Last 3 Encounters:  10/16/15 272 lb (123.378 kg)  08/06/14 255 lb (115.667 kg)  03/18/14 260 lb 6.4 oz (118.117 kg)           Assessment & Plan:  Need for immunization against influenza - Plan: Flu Vaccine QUAD 36+ mos IM (Fluarix)  Insomnia  Essential hypertension - Plan:  CBC with Differential/Platelet, Comprehensive metabolic panel, Lipid panel  Gastroesophageal reflux disease with esophagitis  Annual physical exam - Plan: HIV antibody, Hepatitis C antibody, PSA  Results for orders placed or performed in visit on 10/16/15  HIV antibody  Result Value Ref Range   HIV 1&2 Ab, 4th Generation NONREACTIVE NONREACTIVE  Hepatitis C antibody  Result Value Ref Range   HCV Ab NEGATIVE  NEGATIVE  CBC with Differential/Platelet  Result Value Ref Range   WBC 7.9 4.0 - 10.5 K/uL   RBC 4.78 4.22 - 5.81 MIL/uL   Hemoglobin 15.4 13.0 - 17.0 g/dL   HCT 44.2 39.0 - 52.0 %   MCV 92.5 78.0 - 100.0 fL   MCH 32.2 26.0 - 34.0 pg   MCHC 34.8 30.0 - 36.0 g/dL   RDW 13.3 11.5 - 15.5 %   Platelets 175 150 - 400 K/uL   MPV 11.6 8.6 - 12.4 fL   Neutrophils Relative % 69 43 - 77 %   Neutro Abs 5.5 1.7 - 7.7 K/uL   Lymphocytes Relative 22 12 - 46 %   Lymphs Abs 1.7 0.7 - 4.0 K/uL   Monocytes Relative 8 3 - 12 %   Monocytes Absolute 0.6 0.1 - 1.0 K/uL   Eosinophils Relative 1 0 - 5 %   Eosinophils Absolute 0.1 0.0 - 0.7 K/uL   Basophils Relative 0 0 - 1 %   Basophils Absolute 0.0 0.0 - 0.1 K/uL   Smear Review Criteria for review not met   Comprehensive metabolic panel  Result Value Ref Range   Sodium 136 135 - 146 mmol/L   Potassium 3.9 3.5 - 5.3 mmol/L   Chloride 102 98 - 110 mmol/L   CO2 23 20 - 31 mmol/L   Glucose, Bld 82 65 - 99 mg/dL   BUN 15 7 - 25 mg/dL   Creat 0.78 0.70 - 1.25 mg/dL   Total Bilirubin 1.2 0.2 - 1.2 mg/dL   Alkaline Phosphatase 56 40 - 115 U/L   AST 26 10 - 35 U/L   ALT 35 9 - 46 U/L   Total Protein 7.1 6.1 - 8.1 g/dL   Albumin 4.4 3.6 - 5.1 g/dL   Calcium 9.3 8.6 - 10.3 mg/dL  Lipid panel  Result Value Ref Range   Cholesterol 203 (H) 125 - 200 mg/dL   Triglycerides 231 (H) <150 mg/dL   HDL 41 >=40 mg/dL   Total CHOL/HDL Ratio 5.0 <=5.0 Ratio   VLDL 46 (H) <30 mg/dL   LDL Cholesterol 116 <130 mg/dL  PSA  Result Value Ref Range   PSA 0.94 <=4.00 ng/mL   Meds ordered this encounter  Medications  . lisinopril-hydrochlorothiazide (PRINZIDE,ZESTORETIC) 10-12.5 MG tablet    Sig: TAKE 1 TABLET BY MOUTH DAILY.    Dispense:  90 tablet    Refill:  3  . zolpidem (AMBIEN) 10 MG tablet    Sig: TAKE 1 TABLET BY MOUTH AT BEDTIME AS NEEDED SLEEP    Dispense:  30 tablet    Refill:  5    This request is for a new prescription for a controlled substance  as required by Federal/State law..  . ciprofloxacin (CIPRO) 500 MG tablet////he carries this for travel     Sig: Take 1 tablet (500 mg total) by mouth 2 (two) times daily.    Dispense:  20 tablet    Refill:  0   We signed a weight loss agreement for 3 pounds a month//diet and exercise prescribed Because  of no daytime hypersomnolence sleep study will not be scheduled and wife will be asked to observe for possible apnea

## 2015-10-19 ENCOUNTER — Encounter: Payer: Self-pay | Admitting: Internal Medicine

## 2015-11-16 ENCOUNTER — Encounter: Payer: Self-pay | Admitting: Internal Medicine

## 2016-05-30 ENCOUNTER — Other Ambulatory Visit: Payer: Self-pay | Admitting: Internal Medicine

## 2016-05-31 NOTE — Telephone Encounter (Signed)
Faxed

## 2016-07-11 ENCOUNTER — Encounter: Payer: Self-pay | Admitting: Gastroenterology

## 2016-10-26 ENCOUNTER — Other Ambulatory Visit: Payer: Self-pay

## 2016-10-26 MED ORDER — LISINOPRIL-HYDROCHLOROTHIAZIDE 10-12.5 MG PO TABS: ORAL_TABLET | ORAL | 0 refills | Status: DC

## 2016-10-26 NOTE — Telephone Encounter (Signed)
Last visit 10/2015 Next visit 10/2016

## 2016-11-03 ENCOUNTER — Ambulatory Visit (INDEPENDENT_AMBULATORY_CARE_PROVIDER_SITE_OTHER): Payer: Managed Care, Other (non HMO) | Admitting: Family Medicine

## 2016-11-03 ENCOUNTER — Telehealth: Payer: Self-pay | Admitting: *Deleted

## 2016-11-03 ENCOUNTER — Telehealth: Payer: Self-pay

## 2016-11-03 ENCOUNTER — Encounter: Payer: Self-pay | Admitting: Family Medicine

## 2016-11-03 VITALS — BP 118/74 | HR 90 | Temp 97.8°F | Resp 18 | Ht 73.25 in | Wt 227.2 lb

## 2016-11-03 DIAGNOSIS — Z13220 Encounter for screening for lipoid disorders: Secondary | ICD-10-CM | POA: Diagnosis not present

## 2016-11-03 DIAGNOSIS — Z23 Encounter for immunization: Secondary | ICD-10-CM | POA: Diagnosis not present

## 2016-11-03 DIAGNOSIS — Z125 Encounter for screening for malignant neoplasm of prostate: Secondary | ICD-10-CM | POA: Diagnosis not present

## 2016-11-03 DIAGNOSIS — R350 Frequency of micturition: Secondary | ICD-10-CM | POA: Diagnosis not present

## 2016-11-03 DIAGNOSIS — G4700 Insomnia, unspecified: Secondary | ICD-10-CM | POA: Diagnosis not present

## 2016-11-03 DIAGNOSIS — I10 Essential (primary) hypertension: Secondary | ICD-10-CM

## 2016-11-03 DIAGNOSIS — D234 Other benign neoplasm of skin of scalp and neck: Secondary | ICD-10-CM | POA: Diagnosis not present

## 2016-11-03 DIAGNOSIS — Z0000 Encounter for general adult medical examination without abnormal findings: Secondary | ICD-10-CM

## 2016-11-03 DIAGNOSIS — H9313 Tinnitus, bilateral: Secondary | ICD-10-CM | POA: Diagnosis not present

## 2016-11-03 DIAGNOSIS — H9193 Unspecified hearing loss, bilateral: Secondary | ICD-10-CM | POA: Diagnosis not present

## 2016-11-03 DIAGNOSIS — Z8669 Personal history of other diseases of the nervous system and sense organs: Secondary | ICD-10-CM | POA: Diagnosis not present

## 2016-11-03 LAB — LIPID PANEL
Cholesterol: 210 mg/dL — ABNORMAL HIGH (ref <200)
HDL: 63 mg/dL (ref >40)
LDL Cholesterol: 126 mg/dL — ABNORMAL HIGH (ref <100)
TRIGLYCERIDES: 103 mg/dL (ref <150)
Total CHOL/HDL Ratio: 3.3 Ratio (ref <5.0)
VLDL: 21 mg/dL (ref <30)

## 2016-11-03 LAB — POCT URINALYSIS DIP (MANUAL ENTRY)
BILIRUBIN UA: NEGATIVE
Blood, UA: NEGATIVE
Glucose, UA: NEGATIVE
Ketones, POC UA: NEGATIVE
LEUKOCYTES UA: NEGATIVE
NITRITE UA: NEGATIVE
PH UA: 5
Protein Ur, POC: NEGATIVE
Spec Grav, UA: 1.02
UROBILINOGEN UA: 0.2

## 2016-11-03 LAB — COMPLETE METABOLIC PANEL WITH GFR
ALBUMIN: 4.5 g/dL (ref 3.6–5.1)
ALK PHOS: 50 U/L (ref 40–115)
ALT: 24 U/L (ref 9–46)
AST: 28 U/L (ref 10–35)
BILIRUBIN TOTAL: 1.4 mg/dL — AB (ref 0.2–1.2)
BUN: 14 mg/dL (ref 7–25)
CO2: 24 mmol/L (ref 20–31)
CREATININE: 0.84 mg/dL (ref 0.70–1.25)
Calcium: 9.5 mg/dL (ref 8.6–10.3)
Chloride: 100 mmol/L (ref 98–110)
GLUCOSE: 79 mg/dL (ref 65–99)
Potassium: 4.5 mmol/L (ref 3.5–5.3)
Sodium: 136 mmol/L (ref 135–146)
TOTAL PROTEIN: 7.4 g/dL (ref 6.1–8.1)

## 2016-11-03 MED ORDER — ZOSTER VACCINE LIVE 19400 UNT/0.65ML ~~LOC~~ SUSR: 0.6500 mL | Freq: Once | SUBCUTANEOUS | 0 refills | Status: AC

## 2016-11-03 MED ORDER — CIPROFLOXACIN HCL 500 MG PO TABS: 500.0000 mg | ORAL_TABLET | Freq: Two times a day (BID) | ORAL | 0 refills | Status: DC

## 2016-11-03 MED ORDER — ZOLPIDEM TARTRATE 5 MG PO TABS: 5.0000 mg | ORAL_TABLET | Freq: Every evening | ORAL | 5 refills | Status: DC | PRN

## 2016-11-03 NOTE — Telephone Encounter (Signed)
[facility] called to verify medication refill fax.  Please advise  [phone]

## 2016-11-03 NOTE — Progress Notes (Signed)
By signing my name below, I, Mesha Guinyard, attest that this documentation has been prepared under the direction and in the presence of Merri Ray, MD.  Electronically Signed: Verlee Monte, Medical Scribe. 11/03/16. 2:02 PM.  Subjective:    Patient ID: Warren Hill, male    DOB: 1956/05/25, 60 y.o.   MRN: [id number]  HPI Chief Complaint  Patient presents with  . Annual Exam    CPE     HPI Comments: Warren Hill is a 60 y.o. male with a PMHX of GERD, HTN, and insomina who presents to the [facility] for his complete physical. Prev pt of Dr. Laney Pastor. Pt flys frequently for his job.  Cyst on Scalp: Has had them since college and he's had 4 removed in the past. Had dermatologist, Dr. Tonia Brooms, remove them in the past with the last one removed 7 years ago. Has noticed 2 cyst forming within the past year and notes soreness on the back of his scalp/upper neck. One started a year ago and the other started a couple of months ago.  HTN: Takes lisinopril-HCTZ 10-12.5mg  QD. Denies taking cholesterol medication. Lab Results  Component Value Date   CREATININE 0.78 10/16/2015    Lab Results  Component Value Date   CHOL 203 (H) 10/16/2015   HDL 41 10/16/2015   LDLCALC 116 10/16/2015   TRIG 231 (H) 10/16/2015   CHOLHDL 5.0 10/16/2015   BP Readings from Last 3 Encounters:  11/03/16 118/74  10/16/15 132/81  08/06/14 115/76   GERD: Uses pepcid complete.  He no longer has GERD, unless he eats pizza late at night, which he rarely does.  Insomina: Used ambien 10mg  PRN in the past. Last Rx was #30 with 5 refills June 12th. At his last physical in Oct 2016 there was snoring noted, but no apparent apnea or hypersomnia. There are times where he notices he doesn't breath, even during his waking hours during the day. Pt no longer snores now that he's lost weight. Mentions he takes [place] 3x a week due to jet lag from frequent traveling from [place], the [place], and to the  [employer].  Ear Infections/Hx of otitus: Pt used to get "yeast related" ear infections while he was traveling for work. He was Rx cipro by Dr. Jodie Echevaria here in the [name] since it was hard to see a doctor while in [place]. He used to get ear infections regularly, but he hasn't had to use abx in the past year.  Tinnitus and Hearing Loss: Apparently it was evaluated by ENT in the past. Reports having hearing aids in both ears due to his bilateral tinnitus for the past 5 years.  Hand Numbness: Noticed after waking in the morning, but quickly dissolved. This was noted at his physical last year. Does not interfere with day time activities, and no neck pain at that time.  Urinary Frequency: He has to go to the bathroom more and suspects it's secondary to old age. Lab Results  Component Value Date   PSA 0.94 10/16/2015   PSA 1.17 08/06/2014   PSA 0.89 06/05/2013   Cancer Screening: Prostate CA: Would like to get a digital rectal exam. Lab Results  Component Value Date   PSA 0.94 10/16/2015   PSA 1.17 08/06/2014   PSA 0.89 06/05/2013  Colon CA: Colonoscopy with Dr. Sharlett Iles was in 08/2011. Repeat in 5 years for a single polyp. Hx of adenomatous poyps  Immunizations: He is interested in the shingle vaccine and has  already received his flu shot this year. Immunization History  Administered Date(s) Administered  . Influenza Split 09/22/2012  . Influenza,inj,Quad PF,36+ Mos 08/06/2014, 10/16/2015  . Tdap 12/19/2010   Vision: Was seen 12 months ago and his Rx stayed the same.  Visual Acuity Screening   Right eye Left eye Both eyes  Without correction:     With correction: 20/30 20/30 20/25    Dentist: Is followed biannually.  Exercise: Pt walks more and he's eaten better since the last time he came in and has lost weight since last visit. Wt Readings from Last 3 Encounters:  11/03/16 227 lb 3.2 oz (103.1 kg)  10/16/15 272 lb (123.4 kg)  08/06/14 255 lb (115.7 kg)   Hep C/HIV Screening:  Negative Hep C antibody and non reactive HIV Oct 2016  Depression Screening: Depression screen Warren Hill 2/9 11/03/2016 10/16/2015 08/06/2014  Decreased Interest 0 0 0  Down, Depressed, Hopeless 0 0 0  PHQ - 2 Score 0 0 0   Patient Active Problem List   Diagnosis Date Noted  . HTN (hypertension) 01/18/2012  . Insomnia 01/18/2012  . Tinnitus 01/18/2012  . GERD 05/28/2008  . COLITIS 05/28/2008  . COLONIC POLYPS, ADENOMATOUS, HX OF 05/28/2008   Past Medical History:  Diagnosis Date  . Adenomatous polyp of colon   . GERD (gastroesophageal reflux disease)   . Hypertension   . Tinnitus of both ears    Past Surgical History:  Procedure Laterality Date  . COLONOSCOPY    . TONSILLECTOMY AND ADENOIDECTOMY  1962   Allergies  Allergen Reactions  . Levofloxacin Hives and Rash    Skin got like leather  . Penicillins Rash   Prior to Admission medications   Medication Sig Start Date End Date Taking? Authorizing Provider  aspirin 81 MG tablet Take 81 mg by mouth daily.      Historical Provider, MD  ciprofloxacin (CIPRO) 500 MG tablet Take 1 tablet (500 mg total) by mouth 2 (two) times daily. 10/16/15   Leandrew Koyanagi, MD  famotidine-calcium carbonate-magnesium hydroxide (PEPCID COMPLETE) 10-800-165 MG CHEW chewable tablet Chew 1 tablet by mouth daily as needed.    Historical Provider, MD  lisinopril-hydrochlorothiazide (PRINZIDE,ZESTORETIC) 10-12.5 MG tablet TAKE 1 TABLET BY MOUTH DAILY. 10/26/16   Tereasa Coop, PA-C  Multiple Vitamins-Minerals (MULTIVITAMIN WITH MINERALS) tablet Take 1 tablet by mouth daily.      Historical Provider, MD  zolpidem (AMBIEN) 10 MG tablet TAKE 1 TABLET BY MOUTH AT BEDTIME AS NEEDED FOR SLEEP 05/30/16   Leandrew Koyanagi, MD   Social History   Social History  . Marital status: Married    Spouse name: N/A  . Number of children: N/A  . Years of education: N/A   Occupational History  . sales [employer]   Social History Main Topics  . Smoking status:  Never Smoker  . Smokeless tobacco: Never Used  . Alcohol use 8.4 oz/week    14 Glasses of wine per week  . Drug use: No  . Sexual activity: Yes    Birth control/ protection: None   Other Topics Concern  . Not on file   Social History Narrative  . No narrative on file   Review of Systems  HENT: Positive for hearing loss and tinnitus.   Endocrine: Positive for polyuria.  Genitourinary: Positive for frequency.  Allergic/Immunologic: Positive for food allergies.  Neurological: Positive for numbness.  13 point ROS positive for the above as well as stop breathing. Objective:  Physical Exam  Constitutional: He is oriented to person, place, and time. He appears well-developed and well-nourished.  HENT:  Head: Normocephalic and atraumatic.  Right Ear: External ear normal.  Left Ear: External ear normal.  Mouth/Throat: Oropharynx is clear and moist.  Eyes: Conjunctivae and EOM are normal. Pupils are equal, round, and reactive to light.  Neck: Normal range of motion. Neck supple. No thyromegaly present.  Cardiovascular: Normal rate, regular rhythm, normal heart sounds and intact distal pulses.   Pulmonary/Chest: Effort normal and breath sounds normal. No respiratory distress. He has no wheezes.  Abdominal: Soft. He exhibits no distension. There is no tenderness. Hernia confirmed negative in the right inguinal area and confirmed negative in the left inguinal area.  Genitourinary: Prostate normal.  Musculoskeletal: Normal range of motion. He exhibits no edema or tenderness.  Lymphadenopathy:    He has no cervical adenopathy.  Neurological: He is alert and oriented to person, place, and time. He has normal reflexes.  Skin: Skin is warm and dry. No erythema.  Cystic appearing areas on the left posterior scalp, 2 areas on right mid posterior scalp, and left parietal scalp. No surrounding skin erythema, no discharge.  Psychiatric: He has a normal mood and affect. His behavior is normal.    Vitals reviewed.  BP 118/74   Pulse 90   Temp 97.8 F (36.6 C) (Oral)   Resp 18   Ht 6' 1.25" (1.861 m)   Wt 227 lb 3.2 oz (103.1 kg)   SpO2 99%   BMI 29.77 kg/m      Results for orders placed or performed in visit on 11/03/16  POCT urinalysis dipstick  Result Value Ref Range   Color, UA yellow yellow   Clarity, UA clear clear   Glucose, UA negative negative   Bilirubin, UA negative negative   Ketones, POC UA negative negative   Spec Grav, UA 1.020    Blood, UA negative negative   pH, UA 5.0    Protein Ur, POC negative negative   Urobilinogen, UA 0.2    Nitrite, UA Negative Negative   Leukocytes, UA Negative Negative   Assessment & Plan:   Jarmel Kmetz is a 60 y.o. male Annual physical exam  - -anticipatory guidance as below in AVS, screening labs above. Health maintenance items as above in HPI discussed/recommended as applicable.   -Commended on weight loss with diet and exercise.  History of acute otitis externa - Plan: ciprofloxacin (CIPRO) 500 MG tablet  - cipro refilled if needed with travel, but recommended verifying with ENT when to use (sx's of externa orr media, etc). tendinopathy risks discussed   Tinnitus of both ears Bilateral hearing loss, unspecified hearing loss type  - Long-standing, wears hearing aids, denies depression symptoms. RTC precautions  Need for shingles vaccine - Plan: Zoster Vaccine Live, PF, (ZOSTAVAX) 91478 UNT/0.65ML injection  - Zostavax printed to receive at his pharmacy if needed.   Insomnia, unspecified type - Plan: zolpidem (AMBIEN) 5 MG tablet  - Trial of 5 mg dose Ambien to lessen risk of side effects, especially at age 64. Intermittent use, overall stable. Denies snoring or daytime somnolence, and commended on weight loss.   Dermoid cyst of scalp - Plan: Ambulatory referral to Dermatology  - multiple cysts of scalp , recurrent, suspected dermoid cysts.   - refer to dermatology to consider excision, but may need general  surgeon or plastics based on location. Can be discussed with dermatology.   Urinary frequency - Plan: PSA, POCT urinalysis dipstick Screening for  prostate cancer - Plan: PSA  - Reassuring urinalysis in office, check PSA.  - We discussed pros and cons of prostate cancer screening, and after this discussion, he chose to have screening done. PSA obtained, and no concerning findings on DRE.   Essential hypertension - Plan: COMPLETE METABOLIC PANEL WITH GFR, POCT urinalysis dipstick  - Stable, tolerating current dose of meds, but with weight loss, may be able to decrease doses if he remains stable. Labs pending.  Screening for hyperlipidemia - Plan: Lipid panel  - Check lipid panel, but I suspect this will be improved from previous reading given his weight loss and diet/exercise changes  Meds ordered this encounter  Medications  . ciprofloxacin (CIPRO) 500 MG tablet    Sig: Take 1 tablet (500 mg total) by mouth 2 (two) times daily.    Dispense:  20 tablet    Refill:  0  . Zoster Vaccine Live, PF, (ZOSTAVAX) 09811 UNT/0.65ML injection    Sig: Inject 19,400 Units into the skin once.    Dispense:  1 each    Refill:  0  . zolpidem (AMBIEN) 5 MG tablet    Sig: Take 1 tablet (5 mg total) by mouth at bedtime as needed for sleep.    Dispense:  30 tablet    Refill:  5   Patient Instructions   It appears you are due for repeat colonoscopy. Call [place], Dr. Sharlett Iles to schedule. I refilled the Cipro, but clarify that with your ear nose and throat doctor for any potential ear infection.  I will check a prostate test and urine test, but if any worsening of urinary symptoms, return to discuss further. I referred you to dermatology for the cysts on the scalp to determine if they can remove those.  Try the lower dose of Ambien 5 mg up to once per night, if this does not work, can increase back up to 10 mg, but follow up with in the next 6 months or sooner if medications have expired. Congratulations  on the weight loss and good work on exercise and diet changes! Have a good trip to [place] and let me know if you have any questions in the meantime.  Keeping you healthy  Get these tests  Blood pressure- Have your blood pressure checked once a year by your healthcare provider.  Normal blood pressure is 120/80  Weight- Have your body mass index (BMI) calculated to screen for obesity.  BMI is a measure of body fat based on height and weight. You can also calculate your own BMI at [email or web address].  Cholesterol- Have your cholesterol checked every year.  Diabetes- Have your blood sugar checked regularly if you have high blood pressure, high cholesterol, have a family history of diabetes or if you are overweight.  Screening for Colon Cancer- Colonoscopy starting at age 82.  Screening may begin sooner depending on your family history and other health conditions. Follow up colonoscopy as directed by your Gastroenterologist.  Screening for Prostate Cancer- Both blood work (PSA) and a rectal exam help screen for Prostate Cancer.  Screening begins at age 71 with African-American men and at age 35 with Caucasian men.  Screening may begin sooner depending on your family history.  Take these medicines  Aspirin- One aspirin daily can help prevent Heart disease and Stroke.  Flu shot- Every fall.  Tetanus- Every 10 years.  Zostavax- Once after the age of 50 to prevent Shingles.  Pneumonia shot- Once after the age of 15; if you are  younger than 55, ask your healthcare provider if you need a Pneumonia shot.  Take these steps  Don't smoke- If you do smoke, talk to your doctor about quitting.  For tips on how to quit, go to www.smokefree.gov or call 1-800-QUIT-NOW.  Be physically active- Exercise 5 days a week for at least 30 minutes.  If you are not already physically active start slow and gradually work up to 30 minutes of moderate physical activity.  Examples of moderate activity include  walking briskly, mowing the yard, dancing, swimming, bicycling, etc.  Eat a healthy diet- Eat a variety of healthy food such as fruits, vegetables, low fat milk, low fat cheese, yogurt, lean meant, poultry, fish, beans, tofu, etc. For more information go to www.thenutritionsource.org  Drink alcohol in moderation- Limit alcohol intake to less than two drinks a day. Never drink and drive.  Dentist- Brush and floss twice daily; visit your dentist twice a year.  Depression- Your emotional health is as important as your physical health. If you're feeling down, or losing interest in things you would normally enjoy please talk to your healthcare provider.  Eye exam- Visit your eye doctor every year.  Safe sex- If you may be exposed to a sexually transmitted infection, use a condom.  Seat belts- Seat belts can save your life; always wear one.  Smoke/Carbon Monoxide detectors- These detectors need to be installed on the appropriate level of your home.  Replace batteries at least once a year.  Skin cancer- When out in the sun, cover up and use sunscreen 15 SPF or higher.  Violence- If anyone is threatening you, please tell your healthcare provider.  Living Will/ Health care power of attorney- Speak with your healthcare provider and family.   IF you received an x-ray today, you will receive an invoice from [facility]. Please contact [facility] at [phone] with questions or concerns regarding your invoice.   IF you received labwork today, you will receive an invoice from [employer]. Please contact [facility] at [phone] with questions or concerns regarding your invoice.   Our billing staff will not be able to assist you with questions regarding bills from these companies.  You will be contacted with the lab results as soon as they are available. The fastest way to get your results is to activate your My Chart account. Instructions are located on  the last page of this paperwork. If you have not heard from [name] regarding the results in 2 weeks, please contact this office.        I personally performed the services described in this documentation, which was scribed in my presence. The recorded information has been reviewed and considered, and addended by me as needed.   Signed,   Merri Ray, MD [facility].  11/03/16 4:12 PM

## 2016-11-03 NOTE — Telephone Encounter (Signed)
Faxed Rx Zostavax and Ambien to patient pharmacy, per Dr Carlota Raspberry. Confirmation page received at 3:46 pm.

## 2016-11-03 NOTE — Patient Instructions (Addendum)
It appears you are due for repeat colonoscopy. Call [place], Dr. Sharlett Iles to schedule. I refilled the Cipro, but clarify that with your ear nose and throat doctor for any potential ear infection.  I will check a prostate test and urine test, but if any worsening of urinary symptoms, return to discuss further. I referred you to dermatology for the cysts on the scalp to determine if they can remove those.  Try the lower dose of Ambien 5 mg up to once per night, if this does not work, can increase back up to 10 mg, but follow up with in the next 6 months or sooner if medications have expired. Congratulations on the weight loss and good work on exercise and diet changes! Have a good trip to [place] and let me know if you have any questions in the meantime.  Keeping you healthy  Get these tests  Blood pressure- Have your blood pressure checked once a year by your healthcare provider.  Normal blood pressure is 120/80  Weight- Have your body mass index (BMI) calculated to screen for obesity.  BMI is a measure of body fat based on height and weight. You can also calculate your own BMI at [employer]Bank[employer][email or web address].  Cholesterol- Have your cholesterol checked every year.  Diabetes- Have your blood sugar checked regularly if you have high blood pressure, high cholesterol, have a family history of diabetes or if you are overweight.  Screening for Colon Cancer- Colonoscopy starting at age 41.  Screening may begin sooner depending on your family history and other health conditions. Follow up colonoscopy as directed by your Gastroenterologist.  Screening for Prostate Cancer- Both blood work (PSA) and a rectal exam help screen for Prostate Cancer.  Screening begins at age 20 with African-American men and at age 35 with Caucasian men.  Screening may begin sooner depending on your family history.  Take these medicines  Aspirin- One aspirin daily can help prevent Heart disease and Stroke.  Flu shot- Every  fall.  Tetanus- Every 10 years.  Zostavax- Once after the age of 25 to prevent Shingles.  Pneumonia shot- Once after the age of 62; if you are younger than 8, ask your healthcare provider if you need a Pneumonia shot.  Take these steps  Don't smoke- If you do smoke, talk to your doctor about quitting.  For tips on how to quit, go to www.smokefree.gov or call 1-800-QUIT-NOW.  Be physically active- Exercise 5 days a week for at least 30 minutes.  If you are not already physically active start slow and gradually work up to 30 minutes of moderate physical activity.  Examples of moderate activity include walking briskly, mowing the yard, dancing, swimming, bicycling, etc.  Eat a healthy diet- Eat a variety of healthy food such as fruits, vegetables, low fat milk, low fat cheese, yogurt, lean meant, poultry, fish, beans, tofu, etc. For more information go to www.thenutritionsource.org  Drink alcohol in moderation- Limit alcohol intake to less than two drinks a day. Never drink and drive.  Dentist- Brush and floss twice daily; visit your dentist twice a year.  Depression- Your emotional health is as important as your physical health. If you're feeling down, or losing interest in things you would normally enjoy please talk to your healthcare provider.  Eye exam- Visit your eye doctor every year.  Safe sex- If you may be exposed to a sexually transmitted infection, use a condom.  Seat belts- Seat belts can save your life; always wear one.  Smoke/Carbon Monoxide  detectors- These detectors need to be installed on the appropriate level of your home.  Replace batteries at least once a year.  Skin cancer- When out in the sun, cover up and use sunscreen 15 SPF or higher.  Violence- If anyone is threatening you, please tell your healthcare provider.  Living Will/ Health care power of attorney- Speak with your healthcare provider and family.   IF you received an x-ray today, you will receive an  invoice from [facility]. Please contact [facility] at [phone] with questions or concerns regarding your invoice.   IF you received labwork today, you will receive an invoice from [employer]. Please contact [facility] at [phone] with questions or concerns regarding your invoice.   Our billing staff will not be able to assist you with questions regarding bills from these companies.  You will be contacted with the lab results as soon as they are available. The fastest way to get your results is to activate your My Chart account. Instructions are located on the last page of this paperwork. If you have not heard from [name] regarding the results in 2 weeks, please contact this office.

## 2016-11-03 NOTE — Telephone Encounter (Signed)
Called and verified rx for ambien.  They stated that rx that was faxed was hard to read.  Rx verified.

## 2016-11-04 LAB — PSA: PSA: 0.6 ng/mL (ref <=4.0)

## 2017-02-04 ENCOUNTER — Other Ambulatory Visit: Payer: Self-pay | Admitting: Physician Assistant

## 2017-02-04 DIAGNOSIS — I10 Essential (primary) hypertension: Secondary | ICD-10-CM

## 2017-02-28 ENCOUNTER — Encounter: Payer: Self-pay | Admitting: Gastroenterology

## 2017-04-07 ENCOUNTER — Ambulatory Visit (AMBULATORY_SURGERY_CENTER): Payer: Self-pay | Admitting: *Deleted

## 2017-04-07 VITALS — Ht 72.0 in | Wt 247.2 lb

## 2017-04-07 DIAGNOSIS — Z86010 Personal history of colonic polyps: Secondary | ICD-10-CM

## 2017-04-07 MED ORDER — NA SULFATE-K SULFATE-MG SULF 17.5-3.13-1.6 GM/177ML PO SOLN: ORAL | 0 refills | Status: DC

## 2017-04-07 NOTE — Progress Notes (Signed)
Pt denies allergies to eggs or soy products. Denies difficulty with sedation or anesthesia. Denies any diet or weight loss medications. Denies use of supplemental oxygen.  Emmi instructions given for procedure.  

## 2017-04-11 ENCOUNTER — Encounter: Payer: Self-pay | Admitting: Gastroenterology

## 2017-04-19 ENCOUNTER — Encounter: Payer: Self-pay | Admitting: Gastroenterology

## 2017-04-19 ENCOUNTER — Ambulatory Visit (AMBULATORY_SURGERY_CENTER): Payer: BLUE CROSS/BLUE SHIELD | Admitting: Gastroenterology

## 2017-04-19 VITALS — BP 112/72 | HR 57 | Temp 97.8°F | Resp 11 | Ht 73.0 in | Wt 227.0 lb

## 2017-04-19 DIAGNOSIS — D123 Benign neoplasm of transverse colon: Secondary | ICD-10-CM

## 2017-04-19 DIAGNOSIS — Z86010 Personal history of colonic polyps: Secondary | ICD-10-CM

## 2017-04-19 MED ORDER — SODIUM CHLORIDE 0.9 % IV SOLN: 500.0000 mL | INTRAVENOUS | Status: DC

## 2017-04-19 NOTE — Progress Notes (Signed)
Called to room to assist during endoscopic procedure.  Patient ID and intended procedure confirmed with present staff. Received instructions for my participation in the procedure from the performing physician.  

## 2017-04-19 NOTE — Progress Notes (Signed)
Pt's states no medical or surgical changes since previsit or office visit. 

## 2017-04-19 NOTE — Progress Notes (Signed)
To PACU, vss patent aw report to rn 

## 2017-04-19 NOTE — Op Note (Signed)
[facility] Patient Name: Warren Hill Procedure Date: 04/19/2017 11:40 AM MRN: [id number] Endoscopist: Mauri Pole , MD Age: 61 Referring MD:  Date of Birth: 1956-07-28 Gender: Male Account #: [id number] Procedure:                Colonoscopy Indications:              Surveillance: Personal history of adenomatous                            polyps on last colonoscopy 5 years ago, High risk                            colon cancer surveillance: Personal history of                            adenoma less than 10 mm in size Medicines:                Monitored Anesthesia Care Procedure:                Pre-Anesthesia Assessment:                           - Prior to the procedure, a History and Physical                            was performed, and patient medications and                            allergies were reviewed. The patient's tolerance of                            previous anesthesia was also reviewed. The risks                            and benefits of the procedure and the sedation                            options and risks were discussed with the patient.                            All questions were answered, and informed consent                            was obtained. Prior Anticoagulants: The patient has                            taken no previous anticoagulant or antiplatelet                            agents. ASA Grade Assessment: II - A patient with                            mild systemic disease. After reviewing the risks  and benefits, the patient was deemed in                            satisfactory condition to undergo the procedure.                           After obtaining informed consent, the colonoscope                            was passed under direct vision. Throughout the                            procedure, the patient's blood pressure, pulse, and                            oxygen saturations were monitored  continuously. The                            Colonoscope was introduced through the anus and                            advanced to the the cecum, identified by                            appendiceal orifice and ileocecal valve. The                            colonoscopy was performed without difficulty. The                            patient tolerated the procedure well. The quality                            of the bowel preparation was excellent. The                            ileocecal valve, appendiceal orifice, and rectum                            were photographed. Scope In: 11:47:45 AM Scope Out: 12:03:04 PM Scope Withdrawal Time: 0 hours 10 minutes 43 seconds  Total Procedure Duration: 0 hours 15 minutes 19 seconds  Findings:                 The perianal and digital rectal examinations were                            normal.                           A 5 mm polyp was found in the transverse colon. The                            polyp was sessile. The polyp was removed with a  cold snare. Resection and retrieval were complete.                           A single large localized angioectasia without                            bleeding was found in the cecum.                           A few small-mouthed diverticula were found in the                            sigmoid colon.                           Non-bleeding internal hemorrhoids were found during                            retroflexion. The hemorrhoids were small. Complications:            No immediate complications. Estimated Blood Loss:     Estimated blood loss was minimal. Impression:               - One 5 mm polyp in the transverse colon, removed                            with a cold snare. Resected and retrieved.                           - A single non-bleeding colonic angioectasia.                           - Diverticulosis in the sigmoid colon.                           - Non-bleeding  internal hemorrhoids. Recommendation:           - Patient has a contact number available for                            emergencies. The signs and symptoms of potential                            delayed complications were discussed with the                            patient. Return to normal activities tomorrow.                            Written discharge instructions were provided to the                            patient.                           - Resume previous diet.                           -  Continue present medications.                           - Await pathology results.                           - Repeat colonoscopy in 5 years for surveillance                            based on pathology results. Mauri Pole, MD 04/19/2017 12:06:28 PM This report has been signed electronically.

## 2017-04-19 NOTE — Patient Instructions (Signed)
Discharge instructions given. Handouts on polyps,diverticulosis and hemorrhoids. Resume previous medications. YOU HAD AN ENDOSCOPIC PROCEDURE TODAY AT THE [facility]:   Refer to the procedure report that was given to you for any specific questions about what was found during the examination.  If the procedure report does not answer your questions, please call your gastroenterologist to clarify.  If you requested that your care partner not be given the details of your procedure findings, then the procedure report has been included in a sealed envelope for you to review at your convenience later.  YOU SHOULD EXPECT: Some feelings of bloating in the abdomen. Passage of more gas than usual.  Walking can help get rid of the air that was put into your GI tract during the procedure and reduce the bloating. If you had a lower endoscopy (such as a colonoscopy or flexible sigmoidoscopy) you may notice spotting of blood in your stool or on the toilet paper. If you underwent a bowel prep for your procedure, you may not have a normal bowel movement for a few days.  Please Note:  You might notice some irritation and congestion in your nose or some drainage.  This is from the oxygen used during your procedure.  There is no need for concern and it should clear up in a day or so.  SYMPTOMS TO REPORT IMMEDIATELY:   Following lower endoscopy (colonoscopy or flexible sigmoidoscopy):  Excessive amounts of blood in the stool  Significant tenderness or worsening of abdominal pains  Swelling of the abdomen that is new, acute  Fever of 100F or higher   For urgent or emergent issues, a gastroenterologist can be reached at any hour by calling [phone].   DIET:  We do recommend a small meal at first, but then you may proceed to your regular diet.  Drink plenty of fluids but you should avoid alcoholic beverages for 24 hours.  ACTIVITY:  You should plan to take it easy for the rest of today and you  should NOT DRIVE or use heavy machinery until tomorrow (because of the sedation medicines used during the test).    FOLLOW UP: Our staff will call the number listed on your records the next business day following your procedure to check on you and address any questions or concerns that you may have regarding the information given to you following your procedure. If we do not reach you, we will leave a message.  However, if you are feeling well and you are not experiencing any problems, there is no need to return our call.  We will assume that you have returned to your regular daily activities without incident.  If any biopsies were taken you will be contacted by phone or by letter within the next 1-3 weeks.  Please call us at [phone] if you have not heard about the biopsies in 3 weeks.    SIGNATURES/CONFIDENTIALITY: You and/or your care partner have signed paperwork which will be entered into your electronic medical record.  These signatures attest to the fact that that the information above on your After Visit Summary has been reviewed and is understood.  Full responsibility of the confidentiality of this discharge information lies with you and/or your care-partner. 

## 2017-04-20 ENCOUNTER — Telehealth: Payer: Self-pay

## 2017-04-20 NOTE — Telephone Encounter (Signed)
   Follow up Call-  Call back number 04/19/2017  Post procedure Call Back phone  # [phone]  Permission to leave phone message Yes  Some recent data might be hidden    Left message

## 2017-04-20 NOTE — Telephone Encounter (Signed)
  Follow up Call-  Call back number 04/19/2017  Post procedure Call Back phone  # [phone]  Permission to leave phone message Yes  Some recent data might be hidden     Left message

## 2017-04-25 ENCOUNTER — Encounter: Payer: Self-pay | Admitting: Gastroenterology

## 2017-05-10 ENCOUNTER — Other Ambulatory Visit: Payer: Self-pay | Admitting: Physician Assistant

## 2017-05-10 DIAGNOSIS — I10 Essential (primary) hypertension: Secondary | ICD-10-CM

## 2017-06-09 ENCOUNTER — Other Ambulatory Visit: Payer: Self-pay | Admitting: Family Medicine

## 2017-06-09 DIAGNOSIS — I10 Essential (primary) hypertension: Secondary | ICD-10-CM

## 2017-06-11 ENCOUNTER — Other Ambulatory Visit: Payer: Self-pay | Admitting: Family Medicine

## 2017-06-11 DIAGNOSIS — G4700 Insomnia, unspecified: Secondary | ICD-10-CM

## 2017-06-13 NOTE — Telephone Encounter (Signed)
Please advise 

## 2017-06-27 ENCOUNTER — Encounter: Payer: Self-pay | Admitting: Family Medicine

## 2017-06-27 ENCOUNTER — Ambulatory Visit (INDEPENDENT_AMBULATORY_CARE_PROVIDER_SITE_OTHER): Payer: BLUE CROSS/BLUE SHIELD | Admitting: Family Medicine

## 2017-06-27 VITALS — BP 126/78 | HR 76 | Temp 98.2°F | Resp 16 | Ht 73.0 in | Wt 249.8 lb

## 2017-06-27 DIAGNOSIS — I10 Essential (primary) hypertension: Secondary | ICD-10-CM | POA: Diagnosis not present

## 2017-06-27 DIAGNOSIS — E785 Hyperlipidemia, unspecified: Secondary | ICD-10-CM

## 2017-06-27 DIAGNOSIS — Z23 Encounter for immunization: Secondary | ICD-10-CM

## 2017-06-27 DIAGNOSIS — G4700 Insomnia, unspecified: Secondary | ICD-10-CM

## 2017-06-27 MED ORDER — ZOLPIDEM TARTRATE 5 MG PO TABS: 7.5000 mg | ORAL_TABLET | Freq: Every evening | ORAL | 5 refills | Status: DC | PRN

## 2017-06-27 MED ORDER — ZOSTER VAC RECOMB ADJUVANTED 50 MCG/0.5ML IM SUSR: 0.5000 mL | Freq: Once | INTRAMUSCULAR | 1 refills | Status: AC

## 2017-06-27 MED ORDER — LISINOPRIL-HYDROCHLOROTHIAZIDE 10-12.5 MG PO TABS: 1.0000 | ORAL_TABLET | Freq: Every day | ORAL | 1 refills | Status: DC

## 2017-06-27 NOTE — Patient Instructions (Addendum)
5- to 7.5 mg of Ambien at bedtime if needed. I referred you to sleep specialist to determine if sleep apnea testing is needed.  I sent the prescription for shingles vaccine to your pharmacy.  No change in blood pressure medications for now.  Return in 6 weeks for cholesterol testing, but in the meantime work on diet, activity/exercise to see if those numbers will improve from last visit.  Recheck within 6 months for physical.    IF you received an x-ray today, you will receive an invoice from [facility]. Please contact [facility] at [phone] with questions or concerns regarding your invoice.   IF you received labwork today, you will receive an invoice from [facility]. Please contact [facility] at [phone] with questions or concerns regarding your invoice.   Our billing staff will not be able to assist you with questions regarding bills from these companies.  You will be contacted with the lab results as soon as they are available. The fastest way to get your results is to activate your My Chart account. Instructions are located on the last page of this paperwork. If you have not heard from [name] regarding the results in 2 weeks, please contact this office.

## 2017-06-27 NOTE — Progress Notes (Signed)
By signing my name below, I, Mesha Guinyard, attest that this documentation has been prepared under the direction and in the presence of Merri Ray, MD.  Electronically Signed: Verlee Monte, Medical Scribe. 06/27/17. 4:19 PM.  Subjective:    Patient ID: Warren Hill, male    DOB: 1956-09-06, 61 y.o.   MRN: [id number]  HPI Chief Complaint  Patient presents with  . Medication Refill    Lorrin Mais, Lisinopril/HTCZ    HPI Comments: Warren Hill is a 61 y.o. male who presents to Primary Care at [facility] for medication refill. Pt last ate at 1pm today.  HTN: Pt takes lisinopril-HCTZ 10/12.5 mg QD. Pt doesn't check his bp at home. Denies chest pain, SOB, HA, blurry vision, abdominal pain, bloody stool, melena, light-headedness, dizziness, or other acute sxs.  Sleep Disturbance: If he doesn't take enough ambien he'll wake up at 3am-4am and he can't go back to sleep. He would take 5 mg at 8pm and 5 mg before he's going to sleep during the nights he takes it for relief, but he doesn't take it on the weekends. He notes 7.5 mg is his "sweet spot", without waking up with grogginess. The night he doesn't take it he wakes up at 3-4 am, and occasionally he'll have nocturia. He suspects his sxs stem from frequent traveling and part of his long standing tinnitus. He states his tinnitus "drives him crazy", but there is nothing ENT can do. He used to use ear plugs on flights, but he no longer does that as it made tinnitus worse.  He notes weight gain from his diet. His acid reflux has went away, unless he eats late at night. Reports melatonin gives him nightmares. Pt's wife told him he hardly snores now. Denies napping in the day.  Immunizations: Pt would like to get shingrix.   Colon CA Screening: Pt has had his colonoscopy since his visit. 1 polyp found. Repeat in 5 years.  Patient Active Problem List   Diagnosis Date Noted  . HTN (hypertension) 01/18/2012  . Insomnia 01/18/2012  . Tinnitus 01/18/2012    . GERD 05/28/2008  . COLITIS 05/28/2008  . COLONIC POLYPS, ADENOMATOUS, HX OF 05/28/2008   Past Medical History:  Diagnosis Date  . Adenomatous polyp of colon   . GERD (gastroesophageal reflux disease)   . Hypertension   . Tinnitus of both ears    Past Surgical History:  Procedure Laterality Date  . COLONOSCOPY    . TONSILLECTOMY AND ADENOIDECTOMY  1962   Allergies  Allergen Reactions  . Levofloxacin Hives and Rash    Skin got like leather  . Penicillins Rash   Prior to Admission medications   Medication Sig Start Date End Date Taking? Authorizing Provider  aspirin 81 MG tablet Take 81 mg by mouth daily.     Yes [provider]  famotidine-calcium carbonate-magnesium hydroxide (PEPCID COMPLETE) 10-800-165 MG CHEW chewable tablet Chew 1 tablet by mouth daily as needed.   Yes [provider]  lisinopril-hydrochlorothiazide (PRINZIDE,ZESTORETIC) 10-12.5 MG tablet Take 1 tablet by mouth daily. Due for 6 mo follow up asap-Dr. Carlota Raspberry 06/11/17  Yes [name], MD  Multiple Vitamins-Minerals (MULTIVITAMIN WITH MINERALS) tablet Take 1 tablet by mouth daily.     Yes [provider]  zolpidem (AMBIEN) 5 MG tablet Take 1 tablet (5 mg total) by mouth at bedtime as needed for sleep. 11/03/16  Yes [name], MD   Social History   Social History  . Marital status: Married  Spouse name: N/A  . Number of children: N/A  . Years of education: N/A   Occupational History  . sales [employer]   Social History Main Topics  . Smoking status: Never Smoker  . Smokeless tobacco: Never Used  . Alcohol use 8.4 oz/week    14 Glasses of wine per week  . Drug use: No  . Sexual activity: Yes    Birth control/ protection: None   Other Topics Concern  . Not on file   Social History Narrative  . No narrative on file   Review of Systems  Constitutional: Positive for unexpected weight change (gain). Negative for fatigue.  HENT: Positive for tinnitus.    Eyes: Negative for visual disturbance.  Respiratory: Negative for cough, chest tightness and shortness of breath.   Cardiovascular: Negative for chest pain, palpitations and leg swelling.  Gastrointestinal: Negative for abdominal pain and blood in stool.  Neurological: Negative for dizziness, light-headedness and headaches.  Psychiatric/Behavioral: Positive for sleep disturbance.   Objective:  Physical Exam  Constitutional: He is oriented to person, place, and time. He appears well-developed and well-nourished.  HENT:  Head: Normocephalic and atraumatic.  Eyes: EOM are normal. Pupils are equal, round, and reactive to light.  Neck: No JVD present. Carotid bruit is not present.  Cardiovascular: Normal rate, regular rhythm and normal heart sounds.  Exam reveals no gallop and no friction rub.   No murmur heard. Pulmonary/Chest: Effort normal and breath sounds normal. No respiratory distress. He has no wheezes. He has no rales.  Abdominal: Soft. There is no tenderness.  Musculoskeletal: He exhibits no edema.  Neurological: He is alert and oriented to person, place, and time.  Skin: Skin is warm and dry.  Psychiatric: He has a normal mood and affect.  Vitals reviewed.   Vitals:   06/27/17 1530  BP: 126/78  Pulse: 76  Resp: 16  Temp: 98.2 F (36.8 C)  TempSrc: Oral  SpO2: 98%  Weight: 249 lb 12.8 oz (113.3 kg)  Height: 6\' 1"  (1.854 m)   Body mass index is 32.96 kg/m. Assessment & Plan:   Warren Hill is a 61 y.o. male Essential hypertension - Plan: lisinopril-hydrochlorothiazide (PRINZIDE,ZESTORETIC) 10-12.5 MG tablet, Basic metabolic panel, Care order/instruction:  - Stable, tolerating current dose of Zestoretic. Continue same doses, check BMP as not fasting today.  Need for shingles vaccine - Plan: Zoster Vac Recomb Adjuvanted [facility]) injection  - Status post Zostavax, will prescribe Shingrix  to his pharmacy to check into cost.  Insomnia, unspecified type - Plan:  Ambulatory referral to Sleep Studies, zolpidem (AMBIEN) 5 MG tablet  - Recommended evaluation with sleep specialist to determine if sleep apnea testing indicated, potentially with home testing initially due to his frequent travels. May have some component of insomnia due to change in timing of sleep with frequent travel.  -Tolerating Ambien at 7.5 mg dose without known parasomnias.. New prescription given. Lowest effective dose discussed.  Hyperlipidemia, unspecified hyperlipidemia type - Plan: Lipid panel, Hepatic Function Panel  - Not fasting today, and admits to decrease in diet control.  - Fasting lab order placed for labs to be obtained at approximately 6 weeks to allow him time to increase his exercise/activity and work on diet.  Meds ordered this encounter  Medications  . Zoster Vac Recomb Adjuvanted [facility]) injection    Sig: Inject 0.5 mLs into the muscle once. Repeat injection once in 2-6 months.    Dispense:  0.5 mL    Refill:  1  .  lisinopril-hydrochlorothiazide (PRINZIDE,ZESTORETIC) 10-12.5 MG tablet    Sig: Take 1 tablet by mouth daily.    Dispense:  90 tablet    Refill:  1  . zolpidem (AMBIEN) 5 MG tablet    Sig: Take 1.5 tablets (7.5 mg total) by mouth at bedtime as needed for sleep.    Dispense:  45 tablet    Refill:  5   Patient Instructions   5- to 7.5 mg of Ambien at bedtime if needed. I referred you to sleep specialist to determine if sleep apnea testing is needed.  I sent the prescription for shingles vaccine to your pharmacy.  No change in blood pressure medications for now.  Return in 6 weeks for cholesterol testing, but in the meantime work on diet, activity/exercise to see if those numbers will improve from last visit.  Recheck within 6 months for physical.    IF you received an x-ray today, you will receive an invoice from [facility]. Please contact [facility] at [phone] with questions or concerns regarding your invoice.    IF you received labwork today, you will receive an invoice from [facility]. Please contact [facility] at [phone] with questions or concerns regarding your invoice.   Our billing staff will not be able to assist you with questions regarding bills from these companies.  You will be contacted with the lab results as soon as they are available. The fastest way to get your results is to activate your My Chart account. Instructions are located on the last page of this paperwork. If you have not heard from [name] regarding the results in 2 weeks, please contact this office.       I personally performed the services described in this documentation, which was scribed in my presence. The recorded information has been reviewed and considered for accuracy and completeness, addended by me as needed, and agree with information above.  Signed,   Merri Ray, MD Primary Care at [place].  06/27/17 5:20 PM

## 2017-06-28 LAB — BASIC METABOLIC PANEL
BUN / CREAT RATIO: 23 (ref 10–24)
BUN: 19 mg/dL (ref 8–27)
CALCIUM: 9.5 mg/dL (ref 8.6–10.2)
CHLORIDE: 104 mmol/L (ref 96–106)
CO2: 24 mmol/L (ref 20–29)
CREATININE: 0.83 mg/dL (ref 0.76–1.27)
GFR calc non Af Amer: 95 mL/min/{1.73_m2} (ref >59)
GFR, EST AFRICAN AMERICAN: 110 mL/min/{1.73_m2} (ref >59)
Glucose: 98 mg/dL (ref 65–99)
Potassium: 4.6 mmol/L (ref 3.5–5.2)
Sodium: 143 mmol/L (ref 134–144)

## 2017-06-30 ENCOUNTER — Telehealth: Payer: Self-pay

## 2017-06-30 NOTE — Telephone Encounter (Signed)
Called in [place] to [facility].

## 2017-07-20 ENCOUNTER — Telehealth: Payer: Self-pay | Admitting: Family Medicine

## 2017-07-20 NOTE — Telephone Encounter (Signed)
Warren Hill from [facility] called to let us know she has tried reaching the pt to schedule an appt 3 different times and has been unable to reach the pt. The pt is now marked as unable to contact. If there are any questions, Warren Hill can be reached at [phone].

## 2017-08-11 ENCOUNTER — Ambulatory Visit (INDEPENDENT_AMBULATORY_CARE_PROVIDER_SITE_OTHER): Payer: BLUE CROSS/BLUE SHIELD | Admitting: Family Medicine

## 2017-08-11 ENCOUNTER — Encounter: Payer: Self-pay | Admitting: Family Medicine

## 2017-08-11 VITALS — BP 129/70 | HR 74 | Temp 98.2°F | Resp 16 | Ht 71.25 in | Wt 250.4 lb

## 2017-08-11 DIAGNOSIS — J029 Acute pharyngitis, unspecified: Secondary | ICD-10-CM

## 2017-08-11 NOTE — Progress Notes (Signed)
8/24/20184:57 PM  Warren Hill 07-04-1956, 61 y.o. male [id number]  Chief Complaint  Patient presents with  . Sore Throat    x 2 days with difficult swallowing    HPI:   Patient is a 61 y.o. male who presents today for sore throat for past 2 days, worse with swallowing. Denies fever, chills, runny nose or cough. Having some nasal congestion and right ear pressure. Denies any pooling of saliva or significant changes in his voice.   Depression screen [facility] 2/9 08/11/2017 06/27/2017 11/03/2016  Decreased Interest 0 0 0  Down, Depressed, Hopeless 0 0 0  PHQ - 2 Score 0 0 0    Allergies  Allergen Reactions  . Levofloxacin Hives and Rash    Skin got like leather  . Penicillins Rash    Current Outpatient Prescriptions on File Prior to Visit  Medication Sig Dispense Refill  . aspirin 81 MG tablet Take 81 mg by mouth daily.      . famotidine-calcium carbonate-magnesium hydroxide (PEPCID COMPLETE) 10-800-165 MG CHEW chewable tablet Chew 1 tablet by mouth daily as needed.    Marland Kitchen lisinopril-hydrochlorothiazide (PRINZIDE,ZESTORETIC) 10-12.5 MG tablet Take 1 tablet by mouth daily. 90 tablet 1  . Multiple Vitamins-Minerals (MULTIVITAMIN WITH MINERALS) tablet Take 1 tablet by mouth daily.      Marland Kitchen zolpidem (AMBIEN) 5 MG tablet Take 1.5 tablets (7.5 mg total) by mouth at bedtime as needed for sleep. 45 tablet 5   Current Facility-Administered Medications on File Prior to Visit  Medication Dose Route Frequency Provider Last Rate Last Dose  . 0.9 %  sodium chloride infusion  500 mL Intravenous Continuous Nandigam, Venia Minks, MD        Past Medical History:  Diagnosis Date  . Adenomatous polyp of colon   . GERD (gastroesophageal reflux disease)   . Hypertension   . Tinnitus of both ears     Past Surgical History:  Procedure Laterality Date  . COLONOSCOPY    . TONSILLECTOMY AND ADENOIDECTOMY  1962    Social History  Substance Use Topics  . Smoking status: Never Smoker  . Smokeless  tobacco: Never Used  . Alcohol use 8.4 oz/week    14 Glasses of wine per week    Family History  Problem Relation Age of Onset  . Colon polyps Mother   . Hypertension Mother   . Colon polyps Father   . Hypertension Father   . Pancreatic cancer Paternal Uncle   . Liver disease Paternal Uncle   . Colon cancer Maternal Grandmother   . Colon polyps Brother   . Diabetes Paternal Grandmother     Review of Systems  Constitutional: Negative for chills and fever.  Respiratory: Negative for cough and shortness of breath.   Cardiovascular: Negative for chest pain, palpitations and leg swelling.  Gastrointestinal: Negative for abdominal pain, nausea and vomiting.     OBJECTIVE:  Blood pressure 129/70, pulse 74, temperature 98.2 F (36.8 C), temperature source Oral, resp. rate 16, height 5' 11.25" (1.81 m), weight 250 lb 6.4 oz (113.6 kg), SpO2 98 %.  Physical Exam  Constitutional: He is oriented to person, place, and time and well-developed, well-nourished, and in no distress.  HENT:  Head: Normocephalic and atraumatic.  Right Ear: Hearing, tympanic membrane, external ear and ear canal normal.  Left Ear: Hearing, tympanic membrane, external ear and ear canal normal.  Nose: Mucosal edema (right sided) and rhinorrhea present.  Mouth/Throat: Mucous membranes are normal. Uvula swelling present. Posterior oropharyngeal edema and  posterior oropharyngeal erythema present. No oropharyngeal exudate.  Eyes: Pupils are equal, round, and reactive to light. Conjunctivae and EOM are normal.  Neck: Neck supple.  Cardiovascular: Normal rate and regular rhythm.  Exam reveals no gallop and no friction rub.   No murmur heard. Pulmonary/Chest: Effort normal and breath sounds normal. He has no wheezes. He has no rales.  Lymphadenopathy:    He has no cervical adenopathy.  Neurological: He is alert and oriented to person, place, and time. Gait normal.  Skin: Skin is warm and dry.      ASSESSMENT and  PLAN:  1. Acute pharyngitis, unspecified etiology Patient afebrile wo exudates. Most likely viral. Discussed supportive measuresI: increase hydration, rest, OTC medications, etc. RTC precautions discussed.       [name], MD Primary Care at [place], [place] [address] Ph.  [phone] Fax [phone]

## 2017-08-11 NOTE — Patient Instructions (Addendum)
     IF you received an x-ray today, you will receive an invoice from [facility]. Please contact [facility] at [phone] with questions or concerns regarding your invoice.   IF you received labwork today, you will receive an invoice from [facility]. Please contact [facility] at [phone] with questions or concerns regarding your invoice.   Our billing staff will not be able to assist you with questions regarding bills from these companies.  You will be contacted with the lab results as soon as they are available. The fastest way to get your results is to activate your My Chart account. Instructions are located on the last page of this paperwork. If you have not heard from [name] regarding the results in 2 weeks, please contact this office.    Pharyngitis Pharyngitis is redness, pain, and swelling (inflammation) of your pharynx. What are the causes? Pharyngitis is usually caused by infection. Most of the time, these infections are from viruses (viral) and are part of a cold. However, sometimes pharyngitis is caused by bacteria (bacterial). Pharyngitis can also be caused by allergies. Viral pharyngitis may be spread from person to person by coughing, sneezing, and personal items or utensils (cups, forks, spoons, toothbrushes). Bacterial pharyngitis may be spread from person to person by more intimate contact, such as kissing. What are the signs or symptoms? Symptoms of pharyngitis include:  Sore throat.  Tiredness (fatigue).  Low-grade fever.  Headache.  Joint pain and muscle aches.  Skin rashes.  Swollen lymph nodes.  Plaque-like film on throat or tonsils (often seen with bacterial pharyngitis).  How is this diagnosed? Your health care provider will ask you questions about your illness and your symptoms. Your medical history, along with a physical exam, is often all that is needed to diagnose pharyngitis. Sometimes, a rapid strep test is done. Other lab tests may  also be done, depending on the suspected cause. How is this treated? Viral pharyngitis will usually get better in 3-4 days without the use of medicine. Bacterial pharyngitis is treated with medicines that kill germs (antibiotics). Follow these instructions at home:  Drink enough water and fluids to keep your urine clear or pale yellow.  Only take over-the-counter or prescription medicines as directed by your health care provider: ? If you are prescribed antibiotics, make sure you finish them even if you start to feel better. ? Do not take aspirin.  Get lots of rest.  Gargle with 8 oz of salt water ( tsp of salt per 1 qt of water) as often as every 1-2 hours to soothe your throat.  Throat lozenges (if you are not at risk for choking) or sprays may be used to soothe your throat. Contact a health care provider if:  You have large, tender lumps in your neck.  You have a rash.  You cough up green, yellow-brown, or bloody spit. Get help right away if:  Your neck becomes stiff.  You drool or are unable to swallow liquids.  You vomit or are unable to keep medicines or liquids down.  You have severe pain that does not go away with the use of recommended medicines.  You have trouble breathing (not caused by a stuffy nose). This information is not intended to replace advice given to you by your health care provider. Make sure you discuss any questions you have with your health care provider. Document Released: 12/05/2005 Document Revised: 05/12/2016 Document Reviewed: 08/12/2013 Elsevier Interactive Patient Education  2017 [employer].

## 2018-01-17 ENCOUNTER — Other Ambulatory Visit: Payer: Self-pay | Admitting: *Deleted

## 2018-01-17 ENCOUNTER — Other Ambulatory Visit: Payer: Self-pay | Admitting: Family Medicine

## 2018-01-17 DIAGNOSIS — I10 Essential (primary) hypertension: Secondary | ICD-10-CM

## 2018-01-17 NOTE — Telephone Encounter (Signed)
Prinzide refill request Last OV 06/27/17 Don't see future appt scheduled

## 2018-02-24 ENCOUNTER — Other Ambulatory Visit: Payer: Self-pay | Admitting: Family Medicine

## 2018-02-24 DIAGNOSIS — I10 Essential (primary) hypertension: Secondary | ICD-10-CM

## 2018-02-26 ENCOUNTER — Other Ambulatory Visit: Payer: Self-pay

## 2018-02-26 ENCOUNTER — Ambulatory Visit (INDEPENDENT_AMBULATORY_CARE_PROVIDER_SITE_OTHER): Payer: BLUE CROSS/BLUE SHIELD | Admitting: Family Medicine

## 2018-02-26 ENCOUNTER — Encounter: Payer: Self-pay | Admitting: Family Medicine

## 2018-02-26 VITALS — BP 122/80 | HR 85 | Temp 98.5°F | Resp 18 | Ht 71.25 in | Wt 260.0 lb

## 2018-02-26 DIAGNOSIS — R252 Cramp and spasm: Secondary | ICD-10-CM

## 2018-02-26 DIAGNOSIS — R202 Paresthesia of skin: Secondary | ICD-10-CM

## 2018-02-26 DIAGNOSIS — A09 Infectious gastroenteritis and colitis, unspecified: Secondary | ICD-10-CM | POA: Diagnosis not present

## 2018-02-26 DIAGNOSIS — R200 Anesthesia of skin: Secondary | ICD-10-CM

## 2018-02-26 DIAGNOSIS — Z13220 Encounter for screening for lipoid disorders: Secondary | ICD-10-CM | POA: Diagnosis not present

## 2018-02-26 DIAGNOSIS — Z1329 Encounter for screening for other suspected endocrine disorder: Secondary | ICD-10-CM

## 2018-02-26 DIAGNOSIS — G4700 Insomnia, unspecified: Secondary | ICD-10-CM

## 2018-02-26 DIAGNOSIS — I10 Essential (primary) hypertension: Secondary | ICD-10-CM

## 2018-02-26 MED ORDER — ZOLPIDEM TARTRATE 5 MG PO TABS: 5.0000 mg | ORAL_TABLET | Freq: Every evening | ORAL | 5 refills | Status: DC | PRN

## 2018-02-26 MED ORDER — LISINOPRIL-HYDROCHLOROTHIAZIDE 10-12.5 MG PO TABS: 1.0000 | ORAL_TABLET | Freq: Every day | ORAL | 1 refills | Status: DC

## 2018-02-26 MED ORDER — CIPROFLOXACIN HCL 500 MG PO TABS: 500.0000 mg | ORAL_TABLET | Freq: Two times a day (BID) | ORAL | 0 refills | Status: DC

## 2018-02-26 NOTE — Progress Notes (Signed)
Subjective:  By signing my name below, I, Warren Hill, attest that this documentation has been prepared under the direction and in the presence of Merri Ray, MD. Electronically Signed: Moises Hill, [place]. 02/26/2018 , 6:06 PM .  Patient was seen in Room 10 .   Patient ID: Warren Hill, male    DOB: 1956/11/04, 62 y.o.   MRN: [id number] Chief Complaint  Patient presents with  . Medication Refill    all meds and wants an antibotic to travel with    HPI Warren Hill is a 62 y.o. male Here for follow up of medication refill. He ate prior to office visit today.   He works at [employer].   HTN He takes Lisinopril-HCTZ 10-12.5mg  QD. He was stable at last visit. He denies any side effects with his medication. He denies chest pain, shortness of breath, headache, lightheadedness or dizziness. He does mention having some occasional muscle cramps in his hands, but he also does work on his hands.   Lab Results  Component Value Date   CREATININE 0.83 06/27/2017   Lipid screening He wasn't fasting in July 2018, asked to return in 6 months for physical but fasting labs were placed. He's not had that Hill work done.   Insomnia Well controlled with Ambien 7.5mg . He's had long standing tinnitus that's been evaluated by ENT, Dr. Thornell Mule. He's tried noise-cancelling Bose headphone without relief of his tinnitus.   I referred him to sleep specialist in July 2018, as he's had history of snoring and also has occasional nocturia. His snoring had improved when we discussed in July, and no day time somnolence; thought to have some insomnia due to frequent travel as well.   He hasn't been taking Ambien for over a month. His wife informs his snoring had improved.   Numbness in hands He mentions hands becoming numb after sleeping on his side. He states he last mentioned it to Dr. Laney Pastor about 3 years ago. He denies any neck pain.   Traveler's diarrhea He  takes antibiotics for traveler's diarrhea. He's planning to travel to [place] and [place] later this week. He's been taking Cipro without difficulty, but had hives and swelling with Levaquin.   Patient Active Problem List   Diagnosis Date Noted  . HTN (hypertension) 01/18/2012  . Insomnia 01/18/2012  . Tinnitus 01/18/2012  . GERD 05/28/2008  . COLITIS 05/28/2008  . COLONIC POLYPS, ADENOMATOUS, HX OF 05/28/2008   Past Medical History:  Diagnosis Date  . Adenomatous polyp of colon   . GERD (gastroesophageal reflux disease)   . Hypertension   . Tinnitus of both ears    Past Surgical History:  Procedure Laterality Date  . COLONOSCOPY    . TONSILLECTOMY AND ADENOIDECTOMY  1962   Allergies  Allergen Reactions  . Levofloxacin Hives and Rash    Skin got like leather  . Penicillins Rash   Prior to Admission medications   Medication Sig Start Date End Date Taking? Authorizing Provider  aspirin 81 MG tablet Take 81 mg by mouth daily.      [provider]  famotidine-calcium carbonate-magnesium hydroxide (PEPCID COMPLETE) 10-800-165 MG CHEW chewable tablet Chew 1 tablet by mouth daily as needed.    [provider]  lisinopril-hydrochlorothiazide (PRINZIDE,ZESTORETIC) 10-12.5 MG tablet TAKE 1 TABLET BY MOUTH EVERY DAY 01/17/18   [name], MD  Multiple Vitamins-Minerals (MULTIVITAMIN WITH MINERALS) tablet Take 1 tablet by mouth daily.      [provider]  zolpidem (AMBIEN) 5 MG tablet Take 1.5 tablets (7.5 mg total) by mouth at bedtime as needed for sleep. 06/27/17   [name], MD   Social History   Socioeconomic History  . Marital status: Married    Spouse name: Not on file  . Number of children: Not on file  . Years of education: Not on file  . Highest education level: Not on file  Social Needs  . Financial resource strain: Not on file  . Food insecurity - worry: Not on file  . Food insecurity - inability: Not on file  .  Transportation needs - medical: Not on file  . Transportation needs - non-medical: Not on file  Occupational History  . Occupation: Scientist, clinical (histocompatibility and immunogenetics): [employer]  Tobacco Use  . Smoking status: Never Smoker  . Smokeless tobacco: Never Used  Substance and Sexual Activity  . Alcohol use: Yes    Alcohol/week: 8.4 oz    Types: 14 Glasses of wine per week  . Drug use: No  . Sexual activity: Yes    Birth control/protection: None  Other Topics Concern  . Not on file  Social History Narrative  . Not on file   Review of Systems  Constitutional: Negative for fatigue and unexpected weight change.  Eyes: Negative for visual disturbance.  Respiratory: Negative for cough, chest tightness and shortness of breath.   Cardiovascular: Negative for chest pain, palpitations and leg swelling.  Gastrointestinal: Negative for abdominal pain and Hill in stool.  Musculoskeletal: Negative for neck pain and neck stiffness.  Neurological: Positive for numbness. Negative for dizziness, light-headedness and headaches.       Objective:   Physical Exam  Constitutional: He is oriented to person, place, and time. He appears well-developed and well-nourished.  HENT:  Head: Normocephalic and atraumatic.  Eyes: EOM are normal. Pupils are equal, round, and reactive to light.  Neck: No JVD present. Carotid bruit is not present.  Cardiovascular: Normal rate, regular rhythm and normal heart sounds.  No murmur heard. Pulmonary/Chest: Effort normal and breath sounds normal. He has no rales.  Musculoskeletal: He exhibits no edema.  Neurological: He is alert and oriented to person, place, and time.  Skin: Skin is warm and dry.  Psychiatric: He has a normal mood and affect.  Vitals reviewed.   Vitals:   02/26/18 1730  BP: 122/80  Pulse: 85  Resp: 18  Temp: 98.5 F (36.9 C)  TempSrc: Oral  SpO2: 98%  Weight: 260 lb (117.9 kg)  Height: 5' 11.25" (1.81 m)       Assessment & Plan:   Warren Hill is a  62 y.o. male Essential hypertension - Plan: lisinopril-hydrochlorothiazide (PRINZIDE,ZESTORETIC) 10-12.5 MG tablet  - overall stable.tolerating current regimen -  No changes in meds, labs pending.   Insomnia, unspecified type - Plan: zolpidem (AMBIEN) 5 MG tablet  - longstanding issue worsened by overseas travel/time zones. No parasomnias or overuse of Ambien.   - refilled at 7.5mg  dose. Lowest effective dose.   - consider sleep study/home sleep study - especially if snoring/daytime somnolence.   Muscle cramps - Plan: TSH, Magnesium, Comprehensive metabolic panel Numbness and tingling in both hands Screening for thyroid disorder - Plan: TSH  - start with TSH, magnesium to r/o metabolic cause. Consider cervical source - follow up to discuss further after labs for possible imaging.   Traveler's diarrhea - Plan: ciprofloxacin (CIPRO) 500 MG tablet  - Rx provided if needed, indications and potential need  for medical eval discussed.   Screening for hyperlipidemia - Plan: Lipid panel   Meds ordered this encounter  Medications  . zolpidem (AMBIEN) 5 MG tablet    Sig: Take 1-1.5 tablets (5-7.5 mg total) by mouth at bedtime as needed for sleep.    Dispense:  45 tablet    Refill:  5  . lisinopril-hydrochlorothiazide (PRINZIDE,ZESTORETIC) 10-12.5 MG tablet    Sig: Take 1 tablet by mouth daily.    Dispense:  90 tablet    Refill:  1  . ciprofloxacin (CIPRO) 500 MG tablet    Sig: Take 1 tablet (500 mg total) by mouth 2 (two) times daily.    Dispense:  20 tablet    Refill:  0   Patient Instructions    Please follow up to discuss numbness in hands when sleeping Please come in tomorrow morning to have fasting Hill work.    If snoring returns, or daytime sleepiness (not associated with travel), would recommend sleep study.  That may be something I can order from here.   Cipro if needed for travelers diarrhea.  If fevers or abdominal pain with that diarrhea, would still recommend medical  care.   Ambien if needed - use lowest effective dose.    IF you received an x-ray today, you will receive an invoice from [facility]. Please contact [facility] at [phone] with questions or concerns regarding your invoice.   IF you received labwork today, you will receive an invoice from [facility]. Please contact [facility] at [phone] with questions or concerns regarding your invoice.   Our billing staff will not be able to assist you with questions regarding bills from these companies.  You will be contacted with the lab results as soon as they are available. The fastest way to get your results is to activate your My Chart account. Instructions are located on the last page of this paperwork. If you have not heard from [name] regarding the results in 2 weeks, please contact this office.      I personally performed the services described in this documentation, which was scribed in my presence. The recorded information has been reviewed and considered for accuracy and completeness, addended by me as needed, and agree with information above.  Signed,   Merri Ray, MD [facility].  02/28/18 10:37 PM

## 2018-02-26 NOTE — Patient Instructions (Addendum)
  Please follow up to discuss numbness in hands when sleeping Please come in tomorrow morning to have fasting blood work.    If snoring returns, or daytime sleepiness (not associated with travel), would recommend sleep study.  That may be something I can order from here.   Cipro if needed for travelers diarrhea.  If fevers or abdominal pain with that diarrhea, would still recommend medical care.   Ambien if needed - use lowest effective dose.    IF you received an x-ray today, you will receive an invoice from [facility]. Please contact [facility] at [phone] with questions or concerns regarding your invoice.   IF you received labwork today, you will receive an invoice from [facility]. Please contact [facility] at [phone] with questions or concerns regarding your invoice.   Our billing staff will not be able to assist you with questions regarding bills from these companies.  You will be contacted with the lab results as soon as they are available. The fastest way to get your results is to activate your My Chart account. Instructions are located on the last page of this paperwork. If you have not heard from [name] regarding the results in 2 weeks, please contact this office.

## 2018-02-27 ENCOUNTER — Ambulatory Visit (INDEPENDENT_AMBULATORY_CARE_PROVIDER_SITE_OTHER): Payer: BLUE CROSS/BLUE SHIELD | Admitting: Family Medicine

## 2018-02-27 DIAGNOSIS — Z13220 Encounter for screening for lipoid disorders: Secondary | ICD-10-CM

## 2018-02-27 DIAGNOSIS — E785 Hyperlipidemia, unspecified: Secondary | ICD-10-CM

## 2018-02-27 DIAGNOSIS — R252 Cramp and spasm: Secondary | ICD-10-CM

## 2018-02-27 DIAGNOSIS — Z1329 Encounter for screening for other suspected endocrine disorder: Secondary | ICD-10-CM

## 2018-02-27 NOTE — Progress Notes (Signed)
Lab Only Visit 

## 2018-02-28 ENCOUNTER — Encounter: Payer: Self-pay | Admitting: Family Medicine

## 2018-02-28 LAB — MAGNESIUM: Magnesium: 2.2 mg/dL (ref 1.6–2.3)

## 2018-02-28 LAB — COMPREHENSIVE METABOLIC PANEL
ALBUMIN: 4.3 g/dL (ref 3.6–4.8)
ALT: 27 IU/L (ref 0–44)
AST: 27 IU/L (ref 0–40)
Albumin/Globulin Ratio: 1.7 (ref 1.2–2.2)
Alkaline Phosphatase: 58 IU/L (ref 39–117)
BUN/Creatinine Ratio: 21 (ref 10–24)
BUN: 19 mg/dL (ref 8–27)
Bilirubin Total: 0.8 mg/dL (ref 0.0–1.2)
CALCIUM: 9.1 mg/dL (ref 8.6–10.2)
CO2: 23 mmol/L (ref 20–29)
CREATININE: 0.92 mg/dL (ref 0.76–1.27)
Chloride: 99 mmol/L (ref 96–106)
GFR, EST AFRICAN AMERICAN: 103 mL/min/{1.73_m2} (ref >59)
GFR, EST NON AFRICAN AMERICAN: 89 mL/min/{1.73_m2} (ref >59)
GLOBULIN, TOTAL: 2.5 g/dL (ref 1.5–4.5)
Glucose: 96 mg/dL (ref 65–99)
POTASSIUM: 4.1 mmol/L (ref 3.5–5.2)
SODIUM: 136 mmol/L (ref 134–144)
TOTAL PROTEIN: 6.8 g/dL (ref 6.0–8.5)

## 2018-02-28 LAB — TSH: TSH: 3.31 u[IU]/mL (ref 0.450–4.500)

## 2018-02-28 LAB — LIPID PANEL
CHOLESTEROL TOTAL: 193 mg/dL (ref 100–199)
Chol/HDL Ratio: 4.5 ratio (ref 0.0–5.0)
HDL: 43 mg/dL (ref >39)
LDL CALC: 105 mg/dL — AB (ref 0–99)
TRIGLYCERIDES: 223 mg/dL — AB (ref 0–149)
VLDL Cholesterol Cal: 45 mg/dL — ABNORMAL HIGH (ref 5–40)

## 2018-02-28 LAB — HEPATIC FUNCTION PANEL: Bilirubin, Direct: 0.2 mg/dL (ref 0.00–0.40)

## 2018-02-28 NOTE — Progress Notes (Signed)
Lab visit for fasting labs.

## 2018-03-02 ENCOUNTER — Telehealth: Payer: Self-pay

## 2018-03-02 NOTE — Telephone Encounter (Signed)
Received Approval Notification for Zolpidem 5mg  Approved 01/28/2018 through 02/26/2021

## 2018-03-08 ENCOUNTER — Encounter: Payer: Self-pay | Admitting: Family Medicine

## 2018-03-08 ENCOUNTER — Other Ambulatory Visit: Payer: Self-pay

## 2018-03-08 ENCOUNTER — Ambulatory Visit (INDEPENDENT_AMBULATORY_CARE_PROVIDER_SITE_OTHER): Payer: BLUE CROSS/BLUE SHIELD | Admitting: Family Medicine

## 2018-03-08 ENCOUNTER — Ambulatory Visit (INDEPENDENT_AMBULATORY_CARE_PROVIDER_SITE_OTHER): Payer: BLUE CROSS/BLUE SHIELD

## 2018-03-08 VITALS — BP 128/78 | HR 87 | Temp 98.2°F | Resp 16 | Ht 72.0 in | Wt 262.4 lb

## 2018-03-08 DIAGNOSIS — K2100 Gastro-esophageal reflux disease with esophagitis, without bleeding: Secondary | ICD-10-CM

## 2018-03-08 DIAGNOSIS — Z0000 Encounter for general adult medical examination without abnormal findings: Secondary | ICD-10-CM

## 2018-03-08 DIAGNOSIS — R200 Anesthesia of skin: Secondary | ICD-10-CM | POA: Diagnosis not present

## 2018-03-08 DIAGNOSIS — I10 Essential (primary) hypertension: Secondary | ICD-10-CM

## 2018-03-08 DIAGNOSIS — Z860100 Personal history of colon polyps, unspecified: Secondary | ICD-10-CM

## 2018-03-08 DIAGNOSIS — H9319 Tinnitus, unspecified ear: Secondary | ICD-10-CM | POA: Diagnosis not present

## 2018-03-08 DIAGNOSIS — Z125 Encounter for screening for malignant neoplasm of prostate: Secondary | ICD-10-CM

## 2018-03-08 DIAGNOSIS — M436 Torticollis: Secondary | ICD-10-CM

## 2018-03-08 NOTE — Progress Notes (Addendum)
Subjective:  By signing my name below, I, Moises Blood, attest that this documentation has been prepared under the direction and in the presence of Merri Ray, MD. Electronically Signed: Moises Blood, [place]. 03/08/2018 , 3:22 PM .  Patient was seen in Room 10 .   Patient ID: Warren Hill, male    DOB: October 20, 1956, 62 y.o.   MRN: [id number] Chief Complaint  Patient presents with  . Annual Exam   HPI Warren Hill is a 62 y.o. male Here for annual physical. He has a history of GERD, HTN, insomnia and tinnitus. See prior visit for discussion of chronic issues.   He recently returned from [place], [place] for business trip.   ROS complaints Urinary frequency- states minimal with aging; will discuss further at future visit.   Numbness in hands Patient reports numbness in bilateral hands, usually noticed after sleeping on his sides at night. He states it started in the past, and thought due to circulation due to laying on hands. He noticed numbness more prevalent recently. He denies any recent falls or injuries. He denies any weakness in his hands.   Neck stiffness He states neck range of motion seem to be decreased about past 3-4 months. He denies any recent fall or injury. He denies any pain, but feels like he's stretching a little further than usual. His TSH and magnesium were normal at last visit.   HTN He takes Lisinopril-HCTZ 10-12.5mg  QD.   GERD He takes Pepcid for GERD.   Lipid screening Lab Results  Component Value Date   CHOL 193 02/27/2018   HDL 43 02/27/2018   LDLCALC 105 (H) 02/27/2018   TRIG 223 (H) 02/27/2018   CHOLHDL 4.5 02/27/2018   The 10-year ASCVD risk score Mikey Bussing DC Jr., et al., 2013) is: 12.6%   Values used to calculate the score:     Age: 3 years     Sex: Male     Is Non-Hispanic African American: No     Diabetic: No     Tobacco smoker: No     Systolic Blood Pressure: 474 mmHg     Is BP treated: Yes     HDL Cholesterol: 43 mg/dL     Total  Cholesterol: 193 mg/dL   Cancer Screening Colonoscopy: May 2nd, 2018, done by Dr. Silverio Decamp, repeat in 5 years with a single polyp.  Prostate cancer screening: not sure if family history of it; father had enlarged prostate, but not cancerous.  Lab Results  Component Value Date   PSA 0.6 11/03/2016   PSA 0.94 10/16/2015   PSA 1.17 08/06/2014   Skin cancer: he had a lesion removed earlier today, done by dermatology, Dr. Magnus Sinning.   Immunizations Immunization History  Administered Date(s) Administered  . Influenza Split 09/22/2012  . Influenza,inj,Quad PF,6+ Mos 08/06/2014, 10/16/2015  . Tdap 12/19/2010   Shingrix: prescription was sent, but pharmacy didn't have it in stock.   Depression Depression screen [facility] 2/9 03/08/2018 02/26/2018 08/11/2017 06/27/2017 11/03/2016  Decreased Interest 0 0 0 0 0  Down, Depressed, Hopeless 0 0 0 0 0  PHQ - 2 Score 0 0 0 0 0     Vision  Visual Acuity Screening   Right eye Left eye Both eyes  Without correction:     With correction: 20/30 20/30 20/25    He is followed by ophthalmologist, seen every 6 months.   Dentist He is followed by dentist once every 6 months.   Exercise He occasionally walks for exercise. He's lost  some weight but gains it back from traveling. He's done weight watchers in the past, with improvement in his weight.   Advanced Directives He has a living will. He will bring in a copy for [name].    Patient Active Problem List   Diagnosis Date Noted  . HTN (hypertension) 01/18/2012  . Insomnia 01/18/2012  . Tinnitus 01/18/2012  . GERD 05/28/2008  . COLITIS 05/28/2008  . COLONIC POLYPS, ADENOMATOUS, HX OF 05/28/2008   Past Medical History:  Diagnosis Date  . Adenomatous polyp of colon   . GERD (gastroesophageal reflux disease)   . Hypertension   . Tinnitus of both ears    Past Surgical History:  Procedure Laterality Date  . COLONOSCOPY    . TONSILLECTOMY AND ADENOIDECTOMY  1962   Allergies  Allergen  Reactions  . Levofloxacin Hives and Rash    Skin got like leather  . Penicillins Rash   Prior to Admission medications   Medication Sig Start Date End Date Taking? Authorizing Provider  aspirin 81 MG tablet Take 81 mg by mouth daily.      [provider]  ciprofloxacin (CIPRO) 500 MG tablet Take 1 tablet (500 mg total) by mouth 2 (two) times daily. 02/26/18   [name], MD  famotidine-calcium carbonate-magnesium hydroxide (PEPCID COMPLETE) 10-800-165 MG CHEW chewable tablet Chew 1 tablet by mouth daily as needed.    [provider]  lisinopril-hydrochlorothiazide (PRINZIDE,ZESTORETIC) 10-12.5 MG tablet Take 1 tablet by mouth daily. 02/26/18   [name], MD  Multiple Vitamins-Minerals (MULTIVITAMIN WITH MINERALS) tablet Take 1 tablet by mouth daily.      [provider]  zolpidem (AMBIEN) 5 MG tablet Take 1-1.5 tablets (5-7.5 mg total) by mouth at bedtime as needed for sleep. 02/26/18   [name], MD   Social History   Socioeconomic History  . Marital status: Married    Spouse name: Not on file  . Number of children: Not on file  . Years of education: Not on file  . Highest education level: Not on file  Occupational History  . Occupation: Scientist, clinical (histocompatibility and immunogenetics): [employer]  . Financial resource strain: Not on file  . Food insecurity:    Worry: Not on file    Inability: Not on file  . Transportation needs:    Medical: Not on file    Non-medical: Not on file  Tobacco Use  . Smoking status: Never Smoker  . Smokeless tobacco: Never Used  Substance and Sexual Activity  . Alcohol use: Yes    Alcohol/week: 8.4 oz    Types: 14 Glasses of wine per week  . Drug use: No  . Sexual activity: Yes    Birth control/protection: None  Lifestyle  . Physical activity:    Days per week: Not on file    Minutes per session: Not on file  . Stress: Not on file  Relationships  . Social connections:    Talks on phone: Not on file    Gets  together: Not on file    Attends religious service: Not on file    Active member of club or organization: Not on file    Attends meetings of clubs or organizations: Not on file    Relationship status: Not on file  . Intimate partner violence:    Fear of current or ex partner: Not on file    Emotionally abused: Not on file    Physically abused: Not on file  Forced sexual activity: Not on file  Other Topics Concern  . Not on file  Social History Narrative  . Not on file   Review of Systems 13 point ROS - positive for numbness, urinary frequency, and neck stiffness     Objective:   Physical Exam  Constitutional: He is oriented to person, place, and time. He appears well-developed and well-nourished.  HENT:  Head: Normocephalic and atraumatic.  Right Ear: External ear normal.  Left Ear: External ear normal.  Mouth/Throat: Oropharynx is clear and moist.  Eyes: Pupils are equal, round, and reactive to light. Conjunctivae and EOM are normal.  Neck: Normal range of motion. Neck supple. No thyromegaly present.  Cardiovascular: Normal rate, regular rhythm, normal heart sounds and intact distal pulses.  Pulmonary/Chest: Effort normal and breath sounds normal. No respiratory distress. He has no wheezes.  Abdominal: Soft. He exhibits no distension. There is no tenderness. Hernia confirmed negative in the right inguinal area and confirmed negative in the left inguinal area.  Genitourinary: Prostate normal.  Musculoskeletal: Normal range of motion. He exhibits no edema or tenderness.  C-spine: significantly decreased extension lacking approximately 50-60 degrees, 45 degrees rotation bilaterally, upper extremity 5/5  Lymphadenopathy:    He has no cervical adenopathy.  Neurological: He is alert and oriented to person, place, and time.  Reflex Scores:      Tricep reflexes are 2+ on the right side and 1+ on the left side.      Bicep reflexes are 2+ on the right side and 2+ on the left side.       Brachioradialis reflexes are 2+ on the right side and 2+ on the left side. Skin: Skin is warm and dry.  Psychiatric: He has a normal mood and affect. His behavior is normal.  Vitals reviewed.    Vitals:   03/08/18 1427  BP: 128/78  Pulse: 87  Resp: 16  Temp: 98.2 F (36.8 C)  TempSrc: Oral  SpO2: 97%  Weight: 262 lb 6.4 oz (119 kg)  Height: 6' (1.829 m)  Dg Cervical Spine Complete  Result Date: 03/08/2018 CLINICAL DATA:  Episodic numbness in the hands. Decreased neck range of motion. EXAM: CERVICAL SPINE - COMPLETE 4+ VIEW COMPARISON:  None. FINDINGS: There is no evidence of cervical spine fracture or prevertebral soft tissue swelling. Alignment is normal. Disc space narrowing C5-6 and C6-7. Osseous spurring across the anterior margins of C4 and C5. BILATERAL lower cervical foraminal narrowing. AP and odontoid views are unremarkable. IMPRESSION: No acute findings.  Cervical spondylosis, worst at C5-6 and C6-7. Electronically Signed   By: Staci Righter M.D.   On: 03/08/2018 16:12        Assessment & Plan:  Warren Hill is a 62 y.o. male Annual physical exam  - -anticipatory guidance as below in AVS, screening labs above. Health maintenance items as above in HPI discussed/recommended as applicable.   Essential hypertension  - stable, no changes.  Prior labs reviewed  Gastroesophageal reflux disease with esophagitis  -Has Pepcid as needed  COLONIC POLYPS, ADENOMATOUS, HX OF  -Up-to-date on colonoscopy, plan on repeat 4 years  Tinnitus, unspecified laterality  -Long-standing.  Discussed last visit.  No interval changes  Screening for prostate cancer - Plan: PSA  -We discussed pros and cons of prostate cancer screening, and after this discussion, he chose to have screening done. PSA obtained, and no concerning findings on DRE.   Hyperlipidemia  -ASCVD risk discussed, plans to attempt weight loss, diet changes with activity  first then recheck levels in 3-6 months to decide  on statin at that time.  Neck stiffness - Plan: DG Cervical Spine Complete Bilateral hand numbness - Plan: DG Cervical Spine Complete  -Numbness noted only in the morning.  Suspect that may be due to positioning at night with his degenerative changes noted on C-spine x-ray.  With strength and reflexes overall intact/equal and absence of symptoms during the day will avoid any new medications/further workup at this time.  However can try Tylenol over-the-counter, range of motion/stretches before bedtime, heating pad as needed, RTC precautions if more persistent symptoms/worsening  No orders of the defined types were placed in this encounter.  Patient Instructions   I will check prostate test.   Work on weight loss with diet changes, continued exercise and recheck cholesterol in 6 months to determine if you should be on a statin medication.   If noticing more stiffness, pain in the neck or numbness in hands, I would recommend meeting with ortho or neck specialist. Tylenol, stretches and range of motion as needed.   Keeping you healthy  Get these tests  Blood pressure- Have your blood pressure checked once a year by your healthcare provider.  Normal blood pressure is 120/80  Weight- Have your body mass index (BMI) calculated to screen for obesity.  BMI is a measure of body fat based on height and weight. You can also calculate your own BMI at [email or web address].  Cholesterol- Have your cholesterol checked every year.  Diabetes- Have your blood sugar checked regularly if you have high blood pressure, high cholesterol, have a family history of diabetes or if you are overweight.  Screening for Colon Cancer- Colonoscopy starting at age 47.  Screening may begin sooner depending on your family history and other health conditions. Follow up colonoscopy as directed by your Gastroenterologist.  Screening for Prostate Cancer- Both blood work (PSA) and a rectal exam help screen for Prostate  Cancer.  Screening begins at age 81 with African-American men and at age 63 with Caucasian men.  Screening may begin sooner depending on your family history.  Take these medicines  Aspirin- One aspirin daily can help prevent Heart disease and Stroke.  Flu shot- Every fall.  Tetanus- Every 10 years.  Zostavax- Once after the age of 77 to prevent Shingles.  Pneumonia shot- Once after the age of 61; if you are younger than 24, ask your healthcare provider if you need a Pneumonia shot.  Take these steps  Don't smoke- If you do smoke, talk to your doctor about quitting.  For tips on how to quit, go to www.smokefree.gov or call 1-800-QUIT-NOW.  Be physically active- Exercise 5 days a week for at least 30 minutes.  If you are not already physically active start slow and gradually work up to 30 minutes of moderate physical activity.  Examples of moderate activity include walking briskly, mowing the yard, dancing, swimming, bicycling, etc.  Eat a healthy diet- Eat a variety of healthy food such as fruits, vegetables, low fat milk, low fat cheese, yogurt, lean meant, poultry, fish, beans, tofu, etc. For more information go to www.thenutritionsource.org  Drink alcohol in moderation- Limit alcohol intake to less than two drinks a day. Never drink and drive.  Dentist- Brush and floss twice daily; visit your dentist twice a year.  Depression- Your emotional health is as important as your physical health. If you're feeling down, or losing interest in things you would normally enjoy please talk to your healthcare provider.  Eye exam- Visit your eye doctor every year.  Safe sex- If you may be exposed to a sexually transmitted infection, use a condom.  Seat belts- Seat belts can save your life; always wear one.  Smoke/Carbon Monoxide detectors- These detectors need to be installed on the appropriate level of your home.  Replace batteries at least once a year.  Skin cancer- When out in the sun,  cover up and use sunscreen 15 SPF or higher.  Violence- If anyone is threatening you, please tell your healthcare provider.  Living Will/ Health care power of attorney- Speak with your healthcare provider and family.   IF you received an x-ray today, you will receive an invoice from [facility]. Please contact [facility] at [phone] with questions or concerns regarding your invoice.   IF you received labwork today, you will receive an invoice from [place]. Please contact [facility] at [phone] with questions or concerns regarding your invoice.   Our billing staff will not be able to assist you with questions regarding bills from these companies.  You will be contacted with the lab results as soon as they are available. The fastest way to get your results is to activate your My Chart account. Instructions are located on the last page of this paperwork. If you have not heard from [name] regarding the results in 2 weeks, please contact this office.       I personally performed the services described in this documentation, which was scribed in my presence. The recorded information has been reviewed and considered for accuracy and completeness, addended by me as needed, and agree with information above.  Signed,   Merri Ray, MD [facility].  03/10/18 10:49 AM

## 2018-03-08 NOTE — Patient Instructions (Addendum)
I will check prostate test.   Work on weight loss with diet changes, continued exercise and recheck cholesterol in 6 months to determine if you should be on a statin medication.   If noticing more stiffness, pain in the neck or numbness in hands, I would recommend meeting with ortho or neck specialist. Tylenol, stretches and range of motion as needed.   Keeping you healthy  Get these tests  Blood pressure- Have your blood pressure checked once a year by your healthcare provider.  Normal blood pressure is 120/80  Weight- Have your body mass index (BMI) calculated to screen for obesity.  BMI is a measure of body fat based on height and weight. You can also calculate your own BMI at [email or web address].  Cholesterol- Have your cholesterol checked every year.  Diabetes- Have your blood sugar checked regularly if you have high blood pressure, high cholesterol, have a family history of diabetes or if you are overweight.  Screening for Colon Cancer- Colonoscopy starting at age 17.  Screening may begin sooner depending on your family history and other health conditions. Follow up colonoscopy as directed by your Gastroenterologist.  Screening for Prostate Cancer- Both blood work (PSA) and a rectal exam help screen for Prostate Cancer.  Screening begins at age 41 with African-American men and at age 11 with Caucasian men.  Screening may begin sooner depending on your family history.  Take these medicines  Aspirin- One aspirin daily can help prevent Heart disease and Stroke.  Flu shot- Every fall.  Tetanus- Every 10 years.  Zostavax- Once after the age of 47 to prevent Shingles.  Pneumonia shot- Once after the age of 101; if you are younger than 84, ask your healthcare provider if you need a Pneumonia shot.  Take these steps  Don't smoke- If you do smoke, talk to your doctor about quitting.  For tips on how to quit, go to www.smokefree.gov or call 1-800-QUIT-NOW.  Be physically active-  Exercise 5 days a week for at least 30 minutes.  If you are not already physically active start slow and gradually work up to 30 minutes of moderate physical activity.  Examples of moderate activity include walking briskly, mowing the yard, dancing, swimming, bicycling, etc.  Eat a healthy diet- Eat a variety of healthy food such as fruits, vegetables, low fat milk, low fat cheese, yogurt, lean meant, poultry, fish, beans, tofu, etc. For more information go to www.thenutritionsource.org  Drink alcohol in moderation- Limit alcohol intake to less than two drinks a day. Never drink and drive.  Dentist- Brush and floss twice daily; visit your dentist twice a year.  Depression- Your emotional health is as important as your physical health. If you're feeling down, or losing interest in things you would normally enjoy please talk to your healthcare provider.  Eye exam- Visit your eye doctor every year.  Safe sex- If you may be exposed to a sexually transmitted infection, use a condom.  Seat belts- Seat belts can save your life; always wear one.  Smoke/Carbon Monoxide detectors- These detectors need to be installed on the appropriate level of your home.  Replace batteries at least once a year.  Skin cancer- When out in the sun, cover up and use sunscreen 15 SPF or higher.  Violence- If anyone is threatening you, please tell your healthcare provider.  Living Will/ Health care power of attorney- Speak with your healthcare provider and family.   IF you received an x-ray today, you will receive an invoice from  [facility]. Please contact [facility] at [phone] with questions or concerns regarding your invoice.   IF you received labwork today, you will receive an invoice from [place]. Please contact [facility] at [phone] with questions or concerns regarding your invoice.   Our billing staff will not be able to assist you with questions regarding bills from these  companies.  You will be contacted with the lab results as soon as they are available. The fastest way to get your results is to activate your My Chart account. Instructions are located on the last page of this paperwork. If you have not heard from [name] regarding the results in 2 weeks, please contact this office.

## 2018-03-09 LAB — PSA: PROSTATE SPECIFIC AG, SERUM: 0.8 ng/mL (ref 0.0–4.0)

## 2018-03-19 ENCOUNTER — Encounter: Payer: Self-pay | Admitting: *Deleted

## 2018-04-02 ENCOUNTER — Other Ambulatory Visit: Payer: Self-pay

## 2018-04-02 ENCOUNTER — Encounter: Payer: Self-pay | Admitting: Physician Assistant

## 2018-04-02 ENCOUNTER — Ambulatory Visit (INDEPENDENT_AMBULATORY_CARE_PROVIDER_SITE_OTHER): Payer: BLUE CROSS/BLUE SHIELD | Admitting: Physician Assistant

## 2018-04-02 VITALS — BP 112/70 | HR 87 | Temp 98.6°F | Resp 18 | Ht 72.44 in | Wt 256.8 lb

## 2018-04-02 DIAGNOSIS — M546 Pain in thoracic spine: Secondary | ICD-10-CM

## 2018-04-02 NOTE — Patient Instructions (Addendum)
I recommend resting today. However, tomorrow I would begin walking and moving around as much as tolerated. Begin stretching in a couple of days. The worse thing you can do for low back pain is lie in bed all day or sit down all day. Use medications as needed.   You should avoid heavy lifting or strenuous repetitive activity to prevent recurrence of event. Experiment with both ice and heat and choose whichever feels best for you.  Use heat pad or ice pack, do not apply directly to skin, use barrier such as towel over the skin. Leave on for 15-20 minutes, 3-4 times a day.  Please perform exercises below. Stretches are to be performed for 2 sets, holding 10-15 seconds each. Recommended to perform this rehab twice daily within pain tolerance for 2 weeks.   FLEXION RANGE OF MOTION AND STRETCHING EXERCISES: STRETCH - Flexion, Single Knee to Chest   Lie on a firm bed or floor with both legs extended in front of you.  Keeping one leg in contact with the floor, bring your opposite knee to your chest. Hold your leg in place by either grabbing behind your thigh or at your knee.  Pull until you feel a gentle stretch in your lower back.   Slowly release your grasp and repeat the exercise with the opposite side.  STRETCH - Flexion, Double Knee to Chest   Lie on a firm bed or floor with both legs extended in front of you.  Keeping one leg in contact with the floor, bring your opposite knee to your chest.  Tense your stomach muscles to support your back and then lift your other knee to your chest. Hold your legs in place by either grabbing behind your thighs or at your knees.  Pull both knees toward your chest until you feel a gentle stretch in your lower back.   Tense your stomach muscles and slowly return one leg at a time to the floor.  STRETCH - Low Trunk Rotation  Lie on a firm bed or floor. Keeping your legs in front of you, bend your knees so they are both pointed toward the ceiling and your  feet are flat on the floor.  Extend your arms out to the side. This will stabilize your upper body by keeping your shoulders in contact with the floor.  Gently and slowly drop both knees together to one side until you feel a gentle stretch in your lower back.   Tense your stomach muscles to support your lower back as you bring your knees back to the starting position. Repeat the exercise to the other side.   EXTENSION RANGE OF MOTION AND FLEXIBILITY EXERCISES: STRETCH - Extension, Prone on Elbows   Lie on your stomach on the floor, a bed will be too soft. Place your palms about shoulder width apart and at the height of your head.  Place your elbows under your shoulders. If this is too painful, stack pillows under your chest.  Allow your body to relax so that your hips drop lower and make contact more completely with the floor.  Slowly return to lying flat on the floor.  RANGE OF MOTION - Extension, Prone Press Ups  Lie on your stomach on the floor, a bed will be too soft. Place your palms about shoulder width apart and at the height of your head.  Keeping your back as relaxed as possible, slowly straighten your elbows while keeping your hips on the floor. You may adjust the placement  of your hands to maximize your comfort. As you gain motion, your hands will come more underneath your shoulders.  Slowly return to lying flat on the floor.  RANGE OF MOTION- Quadruped, Neutral Spine   Assume a hands and knees position on a firm surface. Keep your hands under your shoulders and your knees under your hips. You may place padding under your knees for comfort.  Drop your head and point your tail bone toward the ground below you. This will round out your lower back like an angry cat.    Slowly lift your head and release your tail bone so that your back sags into a large arch, like an old horse.  Repeat this until you feel limber in your lower back.  Now, find your "sweet spot." This will  be the most comfortable position somewhere between the two previous positions. This is your neutral spine. Once you have found this position, tense your stomach muscles to support your lower back.  STRENGTHENING EXERCISES - Low Back Strain These exercises may help you when beginning to rehabilitate your injury. These exercises should be done near your "sweet spot." This is the neutral, low-back arch, somewhere between fully rounded and fully arched, that is your least painful position. When performed in this safe range of motion, these exercises can be used for people who have either a flexion or extension based injury. These exercises may resolve your symptoms with or without further involvement from your physician, physical therapist or athletic trainer. While completing these exercises, remember:   Muscles can gain both the endurance and the strength needed for everyday activities through controlled exercises.  Complete these exercises as instructed by your physician, physical therapist or athletic trainer. Increase the resistance and repetitions only as guided.  You may experience muscle soreness or fatigue, but the pain or discomfort you are trying to eliminate should never worsen during these exercises. If this pain does worsen, stop and make certain you are following the directions exactly. If the pain is still present after adjustments, discontinue the exercise until you can discuss the trouble with your caregiver.  STRENGTHENING - Deep Abdominals, Pelvic Tilt  Lie on a firm bed or floor. Keeping your legs in front of you, bend your knees so they are both pointed toward the ceiling and your feet are flat on the floor.  Tense your lower abdominal muscles to press your lower back into the floor. This motion will rotate your pelvis so that your tail bone is scooping upwards rather than pointing at your feet or into the floor.  STRENGTHENING - Abdominals, Crunches   Lie on a firm bed or floor.  Keeping your legs in front of you, bend your knees so they are both pointed toward the ceiling and your feet are flat on the floor. Cross your arms over your chest.  Slightly tip your chin down without bending your neck.  Tense your abdominals and slowly lift your trunk high enough to just clear your shoulder blades. Lifting higher can put excessive stress on the lower back and does not further strengthen your abdominal muscles.  Control your return to the starting position.  STRENGTHENING - Quadruped, Opposite UE/LE Lift   Assume a hands and knees position on a firm surface. Keep your hands under your shoulders and your knees under your hips. You may place padding under your knees for comfort.  Find your neutral spine and gently tense your abdominal muscles so that you can maintain this position. Your  shoulders and hips should form a rectangle that is parallel with the floor and is not twisted.  Keeping your trunk steady, lift your right hand no higher than your shoulder and then your left leg no higher than your hip. Make sure you are not holding your breath.   Continuing to keep your abdominal muscles tense and your back steady, slowly return to your starting position. Repeat with the opposite arm and leg.  STRENGTHENING - Lower Abdominals, Double Knee Lift  Lie on a firm bed or floor. Keeping your legs in front of you, bend your knees so they are both pointed toward the ceiling and your feet are flat on the floor.  Tense your abdominal muscles to brace your lower back and slowly lift both of your knees until they come over your hips. Be certain not to hold your breath.  POSTURE AND BODY MECHANICS CONSIDERATIONS - Low Back Strain Keeping correct posture when sitting, standing or completing your activities will reduce the stress put on different body tissues, allowing injured tissues a chance to heal and limiting painful experiences. The following are general guidelines for improved  posture. Your physician or physical therapist will provide you with any instructions specific to your needs. While reading these guidelines, remember:  The exercises prescribed by your provider will help you have the flexibility and strength to maintain correct postures.  The correct posture provides the best environment for your joints to work. All of your joints have less wear and tear when properly supported by a spine with good posture. This means you will experience a healthier, less painful body.  Correct posture must be practiced with all of your activities, especially prolonged sitting and standing. Correct posture is as important when doing repetitive low-stress activities (typing) as it is when doing a single heavy-load activity (lifting). RESTING POSITIONS Consider which positions are most painful for you when choosing a resting position. If you have pain with flexion-based activities (sitting, bending, stooping, squatting), choose a position that allows you to rest in a less flexed posture. You would want to avoid curling into a fetal position on your side. If your pain worsens with extension-based activities (prolonged standing, working overhead), avoid resting in an extended position such as sleeping on your stomach. Most people will find more comfort when they rest with their spine in a more neutral position, neither too rounded nor too arched. Lying on a non-sagging bed on your side with a pillow between your knees, or on your back with a pillow under your knees will often provide some relief. Keep in mind, being in any one position for a prolonged period of time, no matter how correct your posture, can still lead to stiffness. PROPER SITTING POSTURE In order to minimize stress and discomfort on your spine, you must sit with correct posture. Sitting with good posture should be effortless for a healthy body. Returning to good posture is a gradual process. Many people can work toward this  most comfortably by using various supports until they have the flexibility and strength to maintain this posture on their own. When sitting with proper posture, your ears will fall over your shoulders and your shoulders will fall over your hips. You should use the back of the chair to support your upper back. Your lower back will be in a neutral position, just slightly arched. You may place a small pillow or folded towel at the base of your lower back for support.  When working at a desk, create  an environment that supports good, upright posture. Without extra support, muscles tire, which leads to excessive strain on joints and other tissues. Keep these recommendations in mind: CHAIR:  A chair should be able to slide under your desk when your back makes contact with the back of the chair. This allows you to work closely.  The chair's height should allow your eyes to be level with the upper part of your monitor and your hands to be slightly lower than your elbows. BODY POSITION  Your feet should make contact with the floor. If this is not possible, use a foot rest.  Keep your ears over your shoulders. This will reduce stress on your neck and lower back. INCORRECT SITTING POSTURES  If you are feeling tired and unable to assume a healthy sitting posture, do not slouch or slump. This puts excessive strain on your back tissues, causing more damage and pain. Healthier options include:  Using more support, like a lumbar pillow.  Switching tasks to something that requires you to be upright or walking.  Talking a brief walk.  Lying down to rest in a neutral-spine position. PROLONGED STANDING WHILE SLIGHTLY LEANING FORWARD  When completing a task that requires you to lean forward while standing in one place for a long time, place either foot up on a stationary 2-4 inch high object to help maintain the best posture. When both feet are on the ground, the lower back tends to lose its slight inward curve.  If this curve flattens (or becomes too large), then the back and your other joints will experience too much stress, tire more quickly, and can cause pain. CORRECT STANDING POSTURES Proper standing posture should be assumed with all daily activities, even if they only take a few moments, like when brushing your teeth. As in sitting, your ears should fall over your shoulders and your shoulders should fall over your hips. You should keep a slight tension in your abdominal muscles to brace your spine. Your tailbone should point down to the ground, not behind your body, resulting in an over-extended swayback posture.  INCORRECT STANDING POSTURES  Common incorrect standing postures include a forward head, locked knees and/or an excessive swayback. WALKING Walk with an upright posture. Your ears, shoulders and hips should all line-up. PROLONGED ACTIVITY IN A FLEXED POSITION When completing a task that requires you to bend forward at your waist or lean over a low surface, try to find a way to stabilize 3 out of 4 of your limbs. You can place a hand or elbow on your thigh or rest a knee on the surface you are reaching across. This will provide you more stability so that your muscles do not fatigue as quickly. By keeping your knees relaxed, or slightly bent, you will also reduce stress across your lower back. CORRECT LIFTING TECHNIQUES DO :   Assume a wide stance. This will provide you more stability and the opportunity to get as close as possible to the object which you are lifting.  Tense your abdominals to brace your spine. Bend at the knees and hips. Keeping your back locked in a neutral-spine position, lift using your leg muscles. Lift with your legs, keeping your back straight.  Test the weight of unknown objects before attempting to lift them.  Try to keep your elbows locked down at your sides in order get the best strength from your shoulders when carrying an object.  Always ask for help when  lifting heavy or awkward objects. INCORRECT  LIFTING TECHNIQUES DO NOT:   Lock your knees when lifting, even if it is a small object.  Bend and twist. Pivot at your feet or move your feet when needing to change directions.  Assume that you can safely pick up even a paper clip without proper posture.        Food Choices for Gastroesophageal Reflux Disease, Adult When you have gastroesophageal reflux disease (GERD), the foods you eat and your eating habits are very important. Choosing the right foods can help ease your discomfort. What guidelines do I need to follow?  Choose fruits, vegetables, whole grains, and low-fat dairy products.  Choose low-fat meat, fish, and poultry.  Limit fats such as oils, salad dressings, butter, nuts, and avocado.  Keep a food diary. This helps you identify foods that cause symptoms.  Avoid foods that cause symptoms. These may be different for everyone.  Eat small meals often instead of 3 large meals a day.  Eat your meals slowly, in a place where you are relaxed.  Limit fried foods.  Cook foods using methods other than frying.  Avoid drinking alcohol.  Avoid drinking large amounts of liquids with your meals.  Avoid bending over or lying down until 2-3 hours after eating. What foods are not recommended? These are some foods and drinks that may make your symptoms worse: Vegetables Tomatoes. Tomato juice. Tomato and spaghetti sauce. Chili peppers. Onion and garlic. Horseradish. Fruits Oranges, grapefruit, and lemon (fruit and juice). Meats High-fat meats, fish, and poultry. This includes hot dogs, ribs, ham, sausage, salami, and bacon. Dairy Whole milk and chocolate milk. Sour cream. Cream. Butter. Ice cream. Cream cheese. Drinks Coffee and tea. Bubbly (carbonated) drinks or energy drinks. Condiments Hot sauce. Barbecue sauce. Sweets/Desserts Chocolate and cocoa. Donuts. Peppermint and spearmint. Fats and Oils High-fat foods. This  includes [place] fries and potato chips. Other Vinegar. Strong spices. This includes black pepper, white pepper, red pepper, cayenne, curry powder, cloves, ginger, and chili powder. The items listed above may not be a complete list of foods and drinks to avoid. Contact your dietitian for more information. This information is not intended to replace advice given to you by your health care provider. Make sure you discuss any questions you have with your health care provider. Document Released: 06/05/2012 Document Revised: 05/12/2016 Document Reviewed: 10/09/2013 Elsevier Interactive Patient Education  2017 [employer].  IF you received an x-ray today, you will receive an invoice from [facility]. Please contact [facility] at [phone] with questions or concerns regarding your invoice.   IF you received labwork today, you will receive an invoice from [facility]. Please contact [facility] at [phone] with questions or concerns regarding your invoice.   Our billing staff will not be able to assist you with questions regarding bills from these companies.  You will be contacted with the lab results as soon as they are available. The fastest way to get your results is to activate your My Chart account. Instructions are located on the last page of this paperwork. If you have not heard from [name] regarding the results in 2 weeks, please contact this office.

## 2018-04-02 NOTE — Progress Notes (Signed)
Warren Hill  MRN: [id number] DOB: 21-Apr-1956  Subjective:   Warren Hill is a 62 y.o. male who presents for evaluation of low back pain. The patient has had no prior back problems. Symptoms have been present for 10 days and are gradually improving.  Onset was related to / precipitated by no known injury but he does carry a heavy back pack daily and thinks this may be contributing to it. He also twisted oddly to remove his luggage off of a flight last week prior to this pain starting.  He does not stretch regularly. The pain is located in the right lumbar area or left lumbar area and does not radiate. The pain is described as "bruise like" and occurs all day. He rates his pain as a 3 on a scale of 0-10. Symptoms are exacerbated by nothing in particular. Symptoms are improved with acetaminophen. He denies weakness in the right leg, weakness in the left leg, tingling in the right leg, tingling in the left leg, burning pain in the right leg, burning pain in the left leg, urinary hesitancy, hematuria, urinary incontinence, urinary retention, bowel incontinence, constipation, impotence and groin/perineal numbness associated with the back pain. The patient has no "red flag" history indicative of complicated back pain.  Review of Systems  Per HPI  Patient Active Problem List   Diagnosis Date Noted  . HTN (hypertension) 01/18/2012  . Insomnia 01/18/2012  . Tinnitus 01/18/2012  . GERD 05/28/2008  . COLITIS 05/28/2008  . COLONIC POLYPS, ADENOMATOUS, HX OF 05/28/2008    Current Outpatient Medications on File Prior to Visit  Medication Sig Dispense Refill  . aspirin 81 MG tablet Take 81 mg by mouth daily.      . famotidine-calcium carbonate-magnesium hydroxide (PEPCID COMPLETE) 10-800-165 MG CHEW chewable tablet Chew 1 tablet by mouth daily as needed.    Marland Kitchen lisinopril-hydrochlorothiazide (PRINZIDE,ZESTORETIC) 10-12.5 MG tablet Take 1 tablet by mouth daily. 90 tablet 1  . zolpidem (AMBIEN) 5 MG tablet  Take 1-1.5 tablets (5-7.5 mg total) by mouth at bedtime as needed for sleep. 45 tablet 5  . doxycycline (VIBRA-TABS) 100 MG tablet Take 100 mg by mouth 2 (two) times daily.  0  . Multiple Vitamins-Minerals (MULTIVITAMIN WITH MINERALS) tablet Take 1 tablet by mouth daily.       Current Facility-Administered Medications on File Prior to Visit  Medication Dose Route Frequency Provider Last Rate Last Dose  . 0.9 %  sodium chloride infusion  500 mL Intravenous Continuous Nandigam, Venia Minks, MD        Allergies  Allergen Reactions  . Levofloxacin Hives and Rash    Skin got like leather  . Penicillins Rash     Objective:  BP 112/70 (BP Location: Left Arm, Patient Position: Sitting, Cuff Size: Large)   Pulse 87   Temp 98.6 F (37 C) (Oral)   Resp 18   Ht 6' 0.44" (1.84 m)   Wt 256 lb 12.8 oz (116.5 kg)   SpO2 97%   BMI 34.41 kg/m   Physical Exam  Constitutional: He is oriented to person, place, and time. He appears well-developed and well-nourished. No distress.  HENT:  Head: Normocephalic and atraumatic.  Eyes: Conjunctivae are normal.  Neck: Normal range of motion.  Pulmonary/Chest: Effort normal.  Musculoskeletal:       Thoracic back: He exhibits tenderness ( tenderness with palpation of bilateral musculature, bilateral musculature is tight to palpation). He exhibits normal range of motion, no bony tenderness, no swelling and no spasm.  Lumbar back: He exhibits normal range of motion, no bony tenderness and no swelling.  Neurological: He is alert and oriented to person, place, and time.  Reflex Scores:      Patellar reflexes are 2+ on the right side and 2+ on the left side.      Achilles reflexes are 2+ on the right side and 2+ on the left side. Muscular strength of bilateral lower extremities is 5/5. Sensation of bilateral lower extremities intact. Normal SLR bilaterally.  Skin: Skin is warm and dry.  Psychiatric: He has a normal mood and affect.  Vitals  reviewed.   Assessment and Plan :  1. Acute bilateral thoracic back pain History and physical exam findings consistent with muscular strain.  No acute findings on exam.  Neuro exam normal.  Recommended rest, stretching, heating pad, and OTC Tylenol as needed.  Advised to return to clinic if symptoms worsen, do not improve, or as needed  Tenna Delaine PA-C  [facility] 04/02/2018 9:12 AM

## 2018-04-03 ENCOUNTER — Encounter: Payer: Self-pay | Admitting: Physician Assistant

## 2018-07-26 ENCOUNTER — Other Ambulatory Visit: Payer: Self-pay | Admitting: Family Medicine

## 2018-07-26 DIAGNOSIS — I10 Essential (primary) hypertension: Secondary | ICD-10-CM

## 2018-09-24 ENCOUNTER — Ambulatory Visit (INDEPENDENT_AMBULATORY_CARE_PROVIDER_SITE_OTHER): Payer: BLUE CROSS/BLUE SHIELD

## 2018-09-24 ENCOUNTER — Other Ambulatory Visit: Payer: Self-pay | Admitting: Podiatry

## 2018-09-24 ENCOUNTER — Encounter: Payer: Self-pay | Admitting: Podiatry

## 2018-09-24 ENCOUNTER — Ambulatory Visit: Payer: BLUE CROSS/BLUE SHIELD | Admitting: Podiatry

## 2018-09-24 VITALS — BP 117/74 | HR 73

## 2018-09-24 DIAGNOSIS — S86012A Strain of left Achilles tendon, initial encounter: Secondary | ICD-10-CM

## 2018-09-24 DIAGNOSIS — M79672 Pain in left foot: Secondary | ICD-10-CM

## 2018-09-24 DIAGNOSIS — R600 Localized edema: Secondary | ICD-10-CM | POA: Diagnosis not present

## 2018-09-24 NOTE — Progress Notes (Signed)
Subjective:   Patient ID: Warren Hill, male   DOB: 63 y.o.   MRN: [id number]   HPI Patient states he tripped while on a hiking trip 2 days ago and fell against his foot and may possibly have felt a snap but is not sure and states that he has had swelling has been urgent care is in a boot now and feels weakness in his calf with quite a bit of Pain in his calf and back of his left leg.  Patient does not smoke and likes to be active   Review of Systems  All other systems reviewed and are negative.       Objective:  Physical Exam  Constitutional: He appears well-developed and well-nourished.  Cardiovascular: Intact distal pulses.  Pulmonary/Chest: Effort normal.  Musculoskeletal: Normal range of motion.  Neurological: He is alert.  Skin: Skin is warm.  Nursing note and vitals reviewed.   Neurovascular status intact muscle strength was found to be adequate of the foot muscles but I did note what appears to be significant diminishment of strength of the Achilles tendon left and it does feel like there is a gap at the musculotendinous junction with quite a bit of swelling in the area.  Patient also has complained of pain at the insertion but this is not as bad as what he is experiencing at the muscle tendon junction currently left     Assessment:  Probability for a tear of the Achilles tendon left secondary to the injury he sustained 2 days ago     Plan:  H&P x-ray reviewed condition discussed at great length.  We will continue immobilization and I applied an Unna boot to take stress off his foot and I am sending for MRI to rule out whether or not there may be a tear.  Patient will be seen back and we get those results in very good chance this patient will require surgery but will wait to the results of the MRI.  I apply the Unna boot today and explained to him the possibility for Achilles tendon tear  X-ray indicates that there is spurring at the insertion to the back of the heel bone  left and possibility for some widening of the ankle at the muscle tendon junction

## 2018-09-26 ENCOUNTER — Telehealth: Payer: Self-pay | Admitting: Podiatry

## 2018-09-26 DIAGNOSIS — R600 Localized edema: Secondary | ICD-10-CM

## 2018-09-26 DIAGNOSIS — S86012A Strain of left Achilles tendon, initial encounter: Secondary | ICD-10-CM

## 2018-09-26 DIAGNOSIS — M79672 Pain in left foot: Secondary | ICD-10-CM

## 2018-09-26 NOTE — Telephone Encounter (Signed)
I called pt, he asked if the MRI would go far enough to show his calf, which has hurt since the accident. I asked pt if he had redness, hardness or heat to the calf and he stated no, but has pain there. I told pt is would ask Dr. Paulla Dolly and call again.

## 2018-09-26 NOTE — Telephone Encounter (Signed)
I have a wrap on my foot and my MRI is not until Saturday. Can I keep the wrap on or do I need to remove it? Also, I still have pain in my calf and I didn't know if my MRI should be of my calf all the way down. My best contact number is [phone].

## 2018-09-27 NOTE — Telephone Encounter (Signed)
Pt states he spoke to [facility] and insurance will not pay for the MRI all will go to the deductable, so if he is to pay he would like to get the calf as well as the ankle.

## 2018-09-27 NOTE — Telephone Encounter (Signed)
I informed pt of Dr. Mellody Drown statement that we would request MRI as far up the leg as possible and that should cover the calf. Pt states understanding and request MRI to be sent to [facility] Attn:  Mardene Celeste. Faxed to [facility] Attn: Mardene Celeste.

## 2018-09-27 NOTE — Telephone Encounter (Signed)
Pt called states [facility] is holding a slot for him, need to fax orders to [place] and call him.

## 2018-09-27 NOTE — Telephone Encounter (Signed)
Pt states [facility] will charge $2030.00, [facility] will charge 1175.00 without and if pay cash will cost 40% less, will speak with his wife.

## 2018-09-27 NOTE — Addendum Note (Signed)
Addended by: Harriett Sine D on: 09/27/2018 11:01 AM   Modules accepted: Orders

## 2018-09-28 ENCOUNTER — Other Ambulatory Visit: Payer: Self-pay | Admitting: Podiatry

## 2018-09-29 ENCOUNTER — Ambulatory Visit (HOSPITAL_COMMUNITY): Payer: BLUE CROSS/BLUE SHIELD

## 2018-09-29 ENCOUNTER — Ambulatory Visit
Admission: RE | Admit: 2018-09-29 | Discharge: 2018-09-29 | Disposition: A | Discharge status: Home / Self Care | Payer: BLUE CROSS/BLUE SHIELD | Source: Ambulatory Visit | Attending: Podiatry | Admitting: Podiatry

## 2018-09-29 ENCOUNTER — Encounter (HOSPITAL_COMMUNITY): Payer: Self-pay

## 2018-09-29 DIAGNOSIS — S86012A Strain of left Achilles tendon, initial encounter: Secondary | ICD-10-CM

## 2018-09-29 DIAGNOSIS — R600 Localized edema: Secondary | ICD-10-CM

## 2018-09-29 DIAGNOSIS — M79672 Pain in left foot: Secondary | ICD-10-CM

## 2018-10-01 ENCOUNTER — Telehealth: Payer: Self-pay | Admitting: Podiatry

## 2018-10-01 NOTE — Telephone Encounter (Signed)
I had an MRI on Saturday and I was wondering if you had received the results yet? I can be reached at [phone].

## 2018-10-02 NOTE — Telephone Encounter (Signed)
I talked to him last night. He is supposed to come in 3 weeks

## 2018-10-02 NOTE — Telephone Encounter (Signed)
Hi Valery, I got a call from Dr. Paulla Dolly last night letting me know I don't need surgery. Now I have questions in regards to how much can I walk, can I go back to work? I have them written down if I can send them to somebody. You can call me back at [phone]. Thank you.

## 2018-10-03 NOTE — Telephone Encounter (Signed)
LVM for patient to return call to answer questions in regards to work and ambulation limitations

## 2018-10-04 NOTE — Telephone Encounter (Signed)
I'm calling Janett Billow, RN back about some questions I have. She tried calling me back yesterday but I was at a funeral. I can be reached at [phone]. Thank you.

## 2018-10-05 ENCOUNTER — Ambulatory Visit: Payer: BLUE CROSS/BLUE SHIELD | Admitting: Podiatry

## 2018-10-05 ENCOUNTER — Encounter: Payer: Self-pay | Admitting: Podiatry

## 2018-10-05 DIAGNOSIS — M779 Enthesopathy, unspecified: Secondary | ICD-10-CM

## 2018-10-08 ENCOUNTER — Telehealth: Payer: Self-pay | Admitting: Podiatry

## 2018-10-08 NOTE — Telephone Encounter (Signed)
Pt called stating he is in a boot and is asking if he could get a temporary handicap stick just until he is out of the boot.

## 2018-10-08 NOTE — Progress Notes (Signed)
Subjective:   Patient ID: Warren Hill, male   DOB: 62 y.o.   MRN: [id number]   HPI Patient states he wants to know when he can go back to work and states the boot is comfortable and his heel is feeling some better   ROS      Objective:  Physical Exam  Neurovascular status intact negative Homans sign noted with patient appearing to have reasonable function of the Achilles tendon with great earlier concern for tear of the Achilles tendon     Assessment:  Appears to have a significant trauma contusion of the left Achilles but does not appear to have acute tear     Plan:  H&P condition reviewed and recommended he can return to work with boot with ice therapy to the back of the heel and gradual stretching exercises to be seen back couple weeks and we will probably initiate physical therapy at that time

## 2018-10-22 ENCOUNTER — Ambulatory Visit: Payer: BLUE CROSS/BLUE SHIELD | Admitting: Podiatry

## 2018-10-22 ENCOUNTER — Encounter: Payer: Self-pay | Admitting: Podiatry

## 2018-10-22 DIAGNOSIS — M779 Enthesopathy, unspecified: Secondary | ICD-10-CM | POA: Diagnosis not present

## 2018-10-22 DIAGNOSIS — S86012A Strain of left Achilles tendon, initial encounter: Secondary | ICD-10-CM

## 2018-10-22 DIAGNOSIS — M79672 Pain in left foot: Secondary | ICD-10-CM

## 2018-10-24 NOTE — Progress Notes (Signed)
Subjective:   Patient ID: Warren Hill, male   DOB: 62 y.o.   MRN: [id number]   HPI Patient states he is improving with his left Achilles but still feels weakness in the muscle and moderate discomfort in the calf muscle.  States that he is ready to get out of the boot and does feel moderately unstable with wearing his boot for this.  Of time   ROS      Objective:  Physical Exam  Neurovascular status unchanged with continued weakness of the Achilles tendon left but it does appear to be functioning and does not appear to be completely torn.  Patient does walk with a slight limp     Assessment:  Probability for partial tear of the Achilles tendon which appears to be healing with weakness and instability secondary to long-term casting and the injury sustained     Plan:  Reviewed condition and I recommended physical therapy to try to strengthen the Achilles tendon and reduce the stress the patient is experiencing.  I am referring to physical therapy for gait training and strengthening exercises and patient will be seen by me in approximately 6 weeks and will gradually increase activity and reduce boot usage

## 2018-11-01 ENCOUNTER — Other Ambulatory Visit: Payer: Self-pay | Admitting: Family Medicine

## 2018-11-01 DIAGNOSIS — I10 Essential (primary) hypertension: Secondary | ICD-10-CM

## 2018-11-01 MED ORDER — LISINOPRIL-HYDROCHLOROTHIAZIDE 10-12.5 MG PO TABS: 1.0000 | ORAL_TABLET | Freq: Every day | ORAL | 0 refills | Status: DC

## 2018-11-01 NOTE — Telephone Encounter (Signed)
Courtesy refill for 30 days given. TC to patient left VM to call for OV to continue blood pressure medication refills. Also, labs are due according to protocol.  Requested Prescriptions  Pending Prescriptions Disp Refills  . lisinopril-hydrochlorothiazide (PRINZIDE,ZESTORETIC) 10-12.5 MG tablet 30 tablet 0    Sig: Take 1 tablet by mouth daily.     Cardiovascular:  ACEI + Diuretic Combos Failed - 11/01/2018 10:38 AM      Failed - Na in normal range and within 180 days    Sodium  Date Value Ref Range Status  02/27/2018 136 134 - 144 mmol/L Final         Failed - K in normal range and within 180 days    Potassium  Date Value Ref Range Status  02/27/2018 4.1 3.5 - 5.2 mmol/L Final         Failed - Cr in normal range and within 180 days    Creat  Date Value Ref Range Status  11/03/2016 0.84 0.70 - 1.25 mg/dL Final    Comment:      For patients > or = 63 years of age: The upper reference limit for Creatinine is approximately 13% higher for people identified as African-American.      Creatinine, Ser  Date Value Ref Range Status  02/27/2018 0.92 0.76 - 1.27 mg/dL Final         Failed - Ca in normal range and within 180 days    Calcium  Date Value Ref Range Status  02/27/2018 9.1 8.6 - 10.2 mg/dL Final         Failed - Valid encounter within last 6 months    Recent Outpatient Visits          7 months ago Acute bilateral thoracic back pain   [facility], [place] D, PA-C   7 months ago Annual physical exam   [facility], MD   8 months ago Screening for hyperlipidemia   [facility], MD   8 months ago Essential hypertension   [facility], Ranell Patrick, MD   1 year ago Acute pharyngitis, unspecified etiology   [facility], Lilia Argue, MD             Passed - Patient is not pregnant      Passed - Last BP in normal range    BP Readings from Last 1 Encounters:   09/24/18 117/74

## 2018-11-01 NOTE — Telephone Encounter (Signed)
Copied from [place] [id number]. Topic: Quick Communication - Rx Refill/Question >> Nov 01, 2018 10:31 AM Rayann Heman wrote: Medication: lisinopril-hydrochlorothiazide (PRINZIDE,ZESTORETIC) 10-12.5 MG tablet [[id number]]  Has the patient contacted their pharmacy? No Preferred Pharmacy (with phone number or street name): [id number] - [place], [place] [phone] (Phone) [phone] (Fax)   Agent: Please be advised that RX refills may take up to 3 business days. We ask that you follow-up with your pharmacy.

## 2018-11-01 NOTE — Telephone Encounter (Signed)
Left VM regarding refill request; pt needs appt.

## 2018-11-01 NOTE — Telephone Encounter (Signed)
Duplicate request

## 2018-11-19 ENCOUNTER — Ambulatory Visit: Payer: BLUE CROSS/BLUE SHIELD | Admitting: Podiatry

## 2018-11-19 ENCOUNTER — Encounter: Payer: Self-pay | Admitting: Podiatry

## 2018-11-19 DIAGNOSIS — M779 Enthesopathy, unspecified: Secondary | ICD-10-CM

## 2018-11-20 NOTE — Progress Notes (Signed)
Subjective:   Patient ID: Warren Hill, male   DOB: 62 y.o.   MRN: [id number]   HPI Patient states my heel is improving some but I still have been wearing my boot most of the time   ROS      Objective:  Physical Exam  Neurovascular status intact with severe trauma to the Achilles tendon left of the muscle tendon junction with reduced inflammation when I palpated the area and what appears to be more strength with physical therapy being of great benefit     Assessment:  Seems to be improving with Achilles tendinitis left still present but improved     Plan:  I advised on gradual reduction of the boot over the next 4 weeks and wearing hightop shoes to try to provide more support along with continued physical therapy stretching exercises.  Reappoint for [name] to recheck again

## 2018-11-23 ENCOUNTER — Ambulatory Visit: Payer: BLUE CROSS/BLUE SHIELD | Admitting: Family Medicine

## 2018-12-04 ENCOUNTER — Encounter: Payer: Self-pay | Admitting: Family Medicine

## 2018-12-04 ENCOUNTER — Other Ambulatory Visit: Payer: Self-pay

## 2018-12-04 ENCOUNTER — Ambulatory Visit (INDEPENDENT_AMBULATORY_CARE_PROVIDER_SITE_OTHER): Payer: BLUE CROSS/BLUE SHIELD | Admitting: Family Medicine

## 2018-12-04 VITALS — BP 131/81 | HR 84 | Temp 98.3°F | Ht 71.0 in | Wt 261.0 lb

## 2018-12-04 DIAGNOSIS — H9312 Tinnitus, left ear: Secondary | ICD-10-CM | POA: Diagnosis not present

## 2018-12-04 DIAGNOSIS — I10 Essential (primary) hypertension: Secondary | ICD-10-CM

## 2018-12-04 DIAGNOSIS — Z13220 Encounter for screening for lipoid disorders: Secondary | ICD-10-CM | POA: Diagnosis not present

## 2018-12-04 NOTE — Patient Instructions (Addendum)
I do not see any sign of infection behind your left ear, some of the clicking may be related to some pressure/fluid in the sinuses or behind the ear.  You can try Afrin nasal spray up to 1 to 2 days, especially before flying.  If any increasing pain, or worsening symptoms I would recommend evaluation to make sure there has not been a development of an infection.  Blood pressure looks okay today, return for fasting blood work in a few days.  Thank you for coming in today    Eustachian Tube Dysfunction The eustachian tube connects the middle ear to the back of the nose. It regulates air pressure in the middle ear by allowing air to move between the ear and nose. It also helps to drain fluid from the middle ear space. When the eustachian tube does not function properly, air pressure, fluid, or both can build up in the middle ear. Eustachian tube dysfunction can affect one or both ears. What are the causes? This condition happens when the eustachian tube becomes blocked or cannot open normally. This may result from:  Ear infections.  Colds and other upper respiratory infections.  Allergies.  Irritation, such as from cigarette smoke or acid from the stomach coming up into the esophagus (gastroesophageal reflux).  Sudden changes in air pressure, such as from descending in an airplane.  Abnormal growths in the nose or throat, such as nasal polyps, tumors, or enlarged tissue at the back of the throat (adenoids).  What increases the risk? This condition may be more likely to develop in people who smoke and people who are overweight. Eustachian tube dysfunction may also be more likely to develop in children, especially children who have:  Certain birth defects of the mouth, such as cleft palate.  Large tonsils and adenoids.  What are the signs or symptoms? Symptoms of this condition may include:  A feeling of fullness in the ear.  Ear pain.  Clicking or popping noises in the  ear.  Ringing in the ear.  Hearing loss.  Loss of balance.  Symptoms may get worse when the air pressure around you changes, such as when you travel to an area of high elevation or fly on an airplane. How is this diagnosed? This condition may be diagnosed based on:  Your symptoms.  A physical exam of your ear, nose, and throat.  Tests, such as those that measure: ? The movement of your eardrum (tympanogram). ? Your hearing (audiometry).  How is this treated? Treatment depends on the cause and severity of your condition. If your symptoms are mild, you may be able to relieve your symptoms by moving air into ("popping") your ears. If you have symptoms of fluid in your ears, treatment may include:  Decongestants.  Antihistamines.  Nasal sprays or ear drops that contain medicines that reduce swelling (steroids).  In some cases, you may need to have a procedure to drain the fluid in your eardrum (myringotomy). In this procedure, a small tube is placed in the eardrum to:  Drain the fluid.  Restore the air in the middle ear space.  Follow these instructions at home:  Take over-the-counter and prescription medicines only as told by your health care provider.  Use techniques to help pop your ears as recommended by your health care provider. These may include: ? Chewing gum. ? Yawning. ? Frequent, forceful swallowing. ? Closing your mouth, holding your nose closed, and gently blowing as if you are trying to blow  air out of your nose.  Do not do any of the following until your health care provider approves: ? Travel to high altitudes. ? Fly in airplanes. ? Work in a Pension scheme manager or room. ? Scuba dive.  Keep your ears dry. Dry your ears completely after showering or bathing.  Do not smoke.  Keep all follow-up visits as told by your health care provider. This is important. Contact a health care provider if:  Your symptoms do not go away after treatment.  Your  symptoms come back after treatment.  You are unable to pop your ears.  You have: ? A fever. ? Pain in your ear. ? Pain in your head or neck. ? Fluid draining from your ear.  Your hearing suddenly changes.  You become very dizzy.  You lose your balance. This information is not intended to replace advice given to you by your health care provider. Make sure you discuss any questions you have with your health care provider. Document Released: 01/01/2016 Document Revised: 05/12/2016 Document Reviewed: 12/24/2014 Elsevier Interactive Patient Education  [employer].   If you have lab work done today you will be contacted with your lab results within the next 2 weeks.  If you have not heard from [name] then please contact us. The fastest way to get your results is to register for My Chart.   IF you received an x-ray today, you will receive an invoice from [facility]. Please contact [facility] at [phone] with questions or concerns regarding your invoice.   IF you received labwork today, you will receive an invoice from [place]. Please contact [facility] at [phone] with questions or concerns regarding your invoice.   Our billing staff will not be able to assist you with questions regarding bills from these companies.  You will be contacted with the lab results as soon as they are available. The fastest way to get your results is to activate your My Chart account. Instructions are located on the last page of this paperwork. If you have not heard from [name] regarding the results in 2 weeks, please contact this office.

## 2018-12-04 NOTE — Progress Notes (Signed)
Subjective:    Patient ID: Warren Hill, male    DOB: 29-Jul-1956, 62 y.o.   MRN: [id number]  HPI Warren Hill is a 62 y.o. male Presents today for: Chief Complaint  Patient presents with  . Medication Refill    lisinopril   . left ear clicking   Left ear clicking Past 3 days.  Taking antihistamine and decongestant - has helped in past, but not helping this time.  bilateral hearing aids. No acute changes in hearing on left.  No recent congestion/runny nose.  no fevers, no ear rash, no ear pain.  Has had ear infections in past  - Dr. Thornell Mule.   Hypertension: BP Readings from Last 3 Encounters:  12/04/18 131/81  09/24/18 117/74  04/02/18 112/70   Lab Results  Component Value Date   CREATININE 0.92 02/27/2018  on lisinopril.  Not fasting, plans to return for lab only visit in few days if possible   Review of Systems  Constitutional: Negative for fatigue and unexpected weight change.  Eyes: Negative for visual disturbance.  Respiratory: Negative for cough, chest tightness and shortness of breath.   Cardiovascular: Negative for chest pain, palpitations and leg swelling.  Gastrointestinal: Negative for abdominal pain and blood in stool.  Neurological: Negative for dizziness, light-headedness and headaches.       Objective:   Physical Exam Vitals signs reviewed.  Constitutional:      Appearance: He is well-developed.  HENT:     Head: Normocephalic and atraumatic.     Ears:     Comments: TMs pearly gray, no effusion seen. Eyes:     Pupils: Pupils are equal, round, and reactive to light.  Neck:     Vascular: No carotid bruit or JVD.  Cardiovascular:     Rate and Rhythm: Normal rate and regular rhythm.     Heart sounds: Normal heart sounds. No murmur.  Pulmonary:     Effort: Pulmonary effort is normal.     Breath sounds: Normal breath sounds. No rales.  Skin:    General: Skin is warm and dry.  Neurological:     Mental Status: He is alert and oriented to person,  place, and time.    Vitals:   12/04/18 1553  BP: 131/81  Pulse: 84  Temp: 98.3 F (36.8 C)  TempSrc: Oral  SpO2: 96%  Weight: 261 lb (118.4 kg)  Height: 5\' 11"  (9.476 m)        Assessment & Plan:   Warren Hill is a 62 y.o. male Clicking tinnitus of left ear  -Reassuring exam, possible eustachian tube dysfunction.  Short-term Afrin discussed, especially with upcoming flight, RTC precautions if worsening or not improving  Essential hypertension - Plan: Comprehensive metabolic panel, Lipid panel Screening for hyperlipidemia - Plan: Lipid panel  -Stable, continue same regimen, plans to return for fasting blood work.  No orders of the defined types were placed in this encounter.  Patient Instructions      I do not see any sign of infection behind your left ear, some of the clicking may be related to some pressure/fluid in the sinuses or behind the ear.  You can try Afrin nasal spray up to 1 to 2 days, especially before flying.  If any increasing pain, or worsening symptoms I would recommend evaluation to make sure there has not been a development of an infection.  Blood pressure looks okay today, return for fasting blood work in a few days.  Thank you for coming in today  Eustachian Tube Dysfunction The eustachian tube connects the middle ear to the back of the nose. It regulates air pressure in the middle ear by allowing air to move between the ear and nose. It also helps to drain fluid from the middle ear space. When the eustachian tube does not function properly, air pressure, fluid, or both can build up in the middle ear. Eustachian tube dysfunction can affect one or both ears. What are the causes? This condition happens when the eustachian tube becomes blocked or cannot open normally. This may result from:  Ear infections.  Colds and other upper respiratory infections.  Allergies.  Irritation, such as from cigarette smoke or acid from the stomach coming up into  the esophagus (gastroesophageal reflux).  Sudden changes in air pressure, such as from descending in an airplane.  Abnormal growths in the nose or throat, such as nasal polyps, tumors, or enlarged tissue at the back of the throat (adenoids).  What increases the risk? This condition may be more likely to develop in people who smoke and people who are overweight. Eustachian tube dysfunction may also be more likely to develop in children, especially children who have:  Certain birth defects of the mouth, such as cleft palate.  Large tonsils and adenoids.  What are the signs or symptoms? Symptoms of this condition may include:  A feeling of fullness in the ear.  Ear pain.  Clicking or popping noises in the ear.  Ringing in the ear.  Hearing loss.  Loss of balance.  Symptoms may get worse when the air pressure around you changes, such as when you travel to an area of high elevation or fly on an airplane. How is this diagnosed? This condition may be diagnosed based on:  Your symptoms.  A physical exam of your ear, nose, and throat.  Tests, such as those that measure: ? The movement of your eardrum (tympanogram). ? Your hearing (audiometry).  How is this treated? Treatment depends on the cause and severity of your condition. If your symptoms are mild, you may be able to relieve your symptoms by moving air into ("popping") your ears. If you have symptoms of fluid in your ears, treatment may include:  Decongestants.  Antihistamines.  Nasal sprays or ear drops that contain medicines that reduce swelling (steroids).  In some cases, you may need to have a procedure to drain the fluid in your eardrum (myringotomy). In this procedure, a small tube is placed in the eardrum to:  Drain the fluid.  Restore the air in the middle ear space.  Follow these instructions at home:  Take over-the-counter and prescription medicines only as told by your health care provider.  Use  techniques to help pop your ears as recommended by your health care provider. These may include: ? Chewing gum. ? Yawning. ? Frequent, forceful swallowing. ? Closing your mouth, holding your nose closed, and gently blowing as if you are trying to blow air out of your nose.  Do not do any of the following until your health care provider approves: ? Travel to high altitudes. ? Fly in airplanes. ? Work in a Pension scheme manager or room. ? Scuba dive.  Keep your ears dry. Dry your ears completely after showering or bathing.  Do not smoke.  Keep all follow-up visits as told by your health care provider. This is important. Contact a health care provider if:  Your symptoms do not go away after treatment.  Your symptoms come back after treatment.  You  are unable to pop your ears.  You have: ? A fever. ? Pain in your ear. ? Pain in your head or neck. ? Fluid draining from your ear.  Your hearing suddenly changes.  You become very dizzy.  You lose your balance. This information is not intended to replace advice given to you by your health care provider. Make sure you discuss any questions you have with your health care provider. Document Released: 01/01/2016 Document Revised: 05/12/2016 Document Reviewed: 12/24/2014 Elsevier Interactive Patient Education  [employer].   If you have lab work done today you will be contacted with your lab results within the next 2 weeks.  If you have not heard from [name] then please contact us. The fastest way to get your results is to register for My Chart.   IF you received an x-ray today, you will receive an invoice from [facility]. Please contact [facility] at [phone] with questions or concerns regarding your invoice.   IF you received labwork today, you will receive an invoice from [place]. Please contact [facility] at [phone] with questions or concerns regarding your invoice.   Our billing staff will not be able  to assist you with questions regarding bills from these companies.  You will be contacted with the lab results as soon as they are available. The fastest way to get your results is to activate your My Chart account. Instructions are located on the last page of this paperwork. If you have not heard from [name] regarding the results in 2 weeks, please contact this office.       Signed,   Merri Ray, MD [facility].  12/10/18 9:20 AM

## 2018-12-07 ENCOUNTER — Ambulatory Visit (INDEPENDENT_AMBULATORY_CARE_PROVIDER_SITE_OTHER): Payer: BLUE CROSS/BLUE SHIELD | Admitting: Family Medicine

## 2018-12-07 DIAGNOSIS — I10 Essential (primary) hypertension: Secondary | ICD-10-CM

## 2018-12-07 DIAGNOSIS — Z13220 Encounter for screening for lipoid disorders: Secondary | ICD-10-CM

## 2018-12-07 NOTE — Progress Notes (Signed)
Labs only

## 2018-12-08 LAB — COMPREHENSIVE METABOLIC PANEL
A/G RATIO: 1.6 (ref 1.2–2.2)
ALBUMIN: 4 g/dL (ref 3.6–4.8)
ALK PHOS: 57 IU/L (ref 39–117)
ALT: 27 IU/L (ref 0–44)
AST: 23 IU/L (ref 0–40)
BILIRUBIN TOTAL: 1 mg/dL (ref 0.0–1.2)
BUN / CREAT RATIO: 14 (ref 10–24)
BUN: 13 mg/dL (ref 8–27)
CHLORIDE: 103 mmol/L (ref 96–106)
CO2: 22 mmol/L (ref 20–29)
Calcium: 9.3 mg/dL (ref 8.6–10.2)
Creatinine, Ser: 0.93 mg/dL (ref 0.76–1.27)
GFR calc Af Amer: 101 mL/min/{1.73_m2} (ref >59)
GFR calc non Af Amer: 87 mL/min/{1.73_m2} (ref >59)
GLOBULIN, TOTAL: 2.5 g/dL (ref 1.5–4.5)
Glucose: 88 mg/dL (ref 65–99)
Potassium: 4.8 mmol/L (ref 3.5–5.2)
SODIUM: 140 mmol/L (ref 134–144)
Total Protein: 6.5 g/dL (ref 6.0–8.5)

## 2018-12-08 LAB — LIPID PANEL
CHOLESTEROL TOTAL: 185 mg/dL (ref 100–199)
Chol/HDL Ratio: 4.2 ratio (ref 0.0–5.0)
HDL: 44 mg/dL (ref >39)
LDL CALC: 100 mg/dL — AB (ref 0–99)
Triglycerides: 206 mg/dL — ABNORMAL HIGH (ref 0–149)
VLDL Cholesterol Cal: 41 mg/dL — ABNORMAL HIGH (ref 5–40)

## 2018-12-10 MED ORDER — LISINOPRIL-HYDROCHLOROTHIAZIDE 10-12.5 MG PO TABS: 1.0000 | ORAL_TABLET | Freq: Every day | ORAL | 1 refills | Status: DC

## 2018-12-13 ENCOUNTER — Other Ambulatory Visit: Payer: BLUE CROSS/BLUE SHIELD | Admitting: Orthotics

## 2018-12-20 ENCOUNTER — Encounter: Payer: Self-pay | Admitting: Radiology

## 2018-12-31 ENCOUNTER — Ambulatory Visit: Payer: BLUE CROSS/BLUE SHIELD | Admitting: Podiatry

## 2018-12-31 DIAGNOSIS — M779 Enthesopathy, unspecified: Secondary | ICD-10-CM

## 2018-12-31 DIAGNOSIS — M79672 Pain in left foot: Secondary | ICD-10-CM | POA: Diagnosis not present

## 2019-01-02 NOTE — Progress Notes (Signed)
Subjective:   Patient ID: Warren Hill, male   DOB: 63 y.o.   MRN: [id number]   HPI Patient presents stating that he is ready to start becoming more active and he did take a hike and also has a second pair of orthotics   ROS      Objective:  Physical Exam  Neurovascular status intact with patient noted to have a significant reduction of discomfort in the left posterior heel with a strong Achilles and no indications of pathology.  Patient does have good digital perfusion noted at the current time     Assessment:  Doing very well after having had Achilles tendon injury left with patient responding well to conservative care     Plan:  Recommended continuation of conservative care and second pair of orthotics will be made for this patient for the long-term.  Will be seen back as needed

## 2019-01-20 IMAGING — DX DG CERVICAL SPINE COMPLETE 4+V
6 series · 6 of 6 positions shown · non-contrast
Comparison: None.

CLINICAL DATA: Episodic numbness in the hands. Decreased neck range
of motion.

EXAM:
CERVICAL SPINE - COMPLETE 4+ VIEW

[c-spine lat]
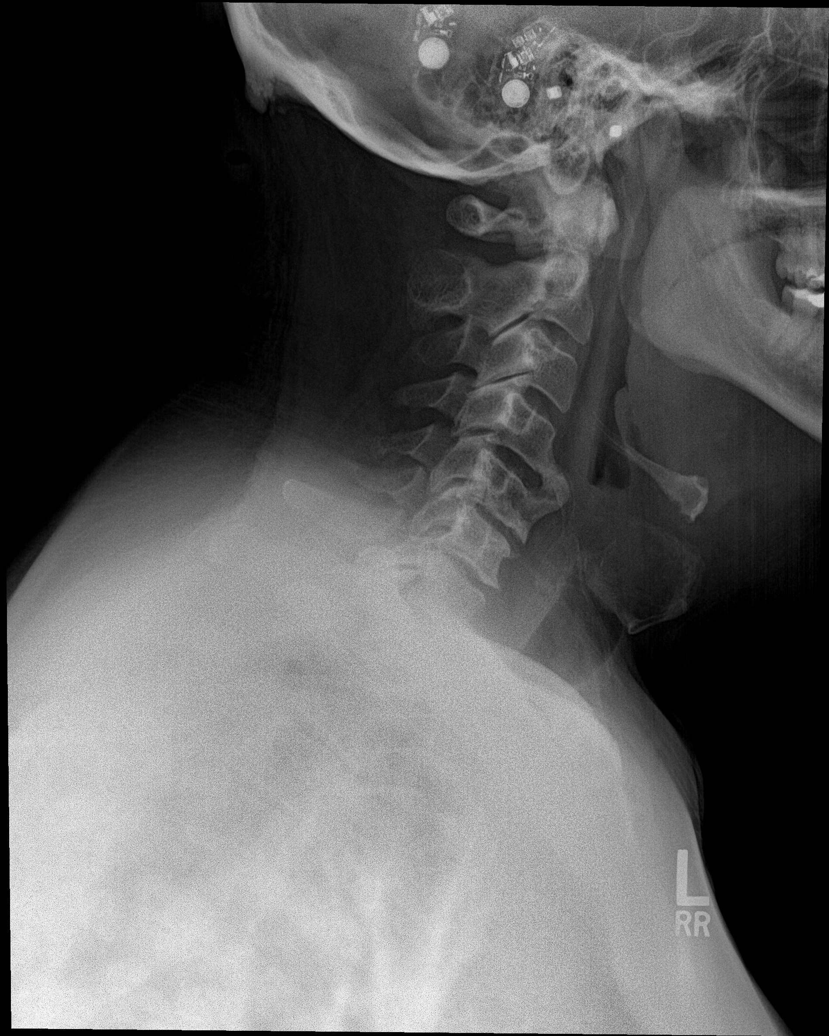

[c-spine obl (1 of 2)]
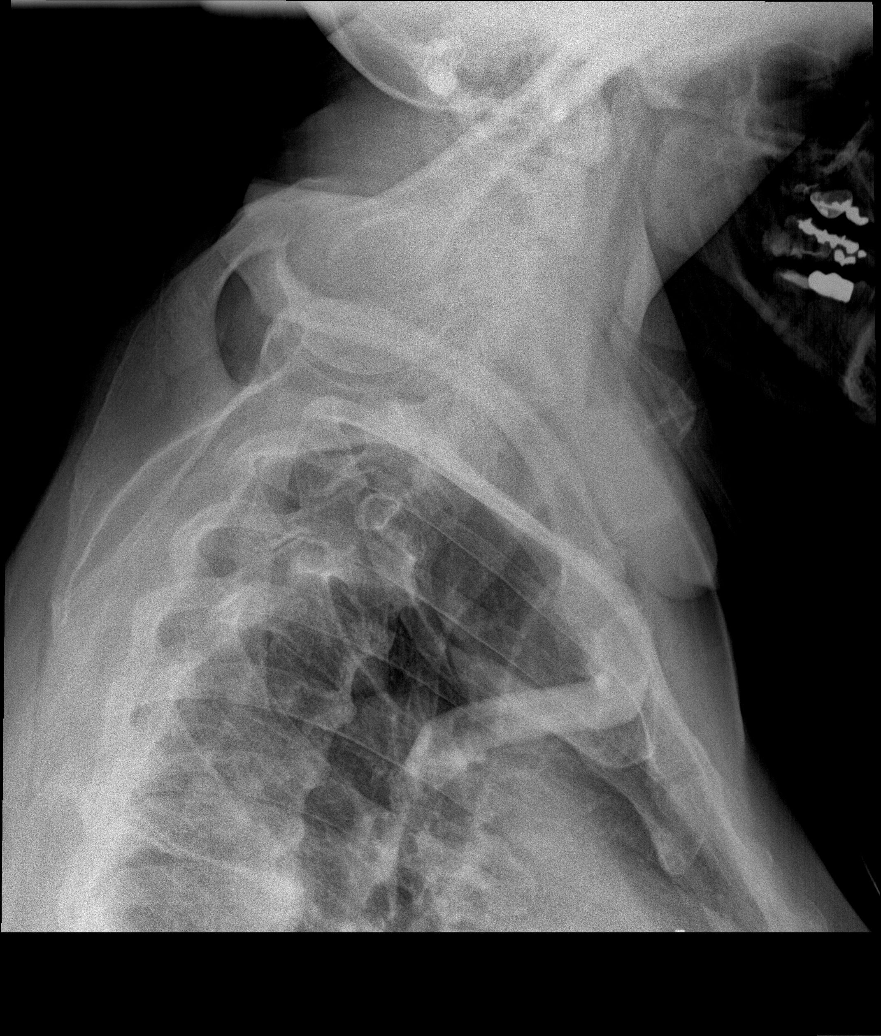

[c-spine obl (2 of 2)]
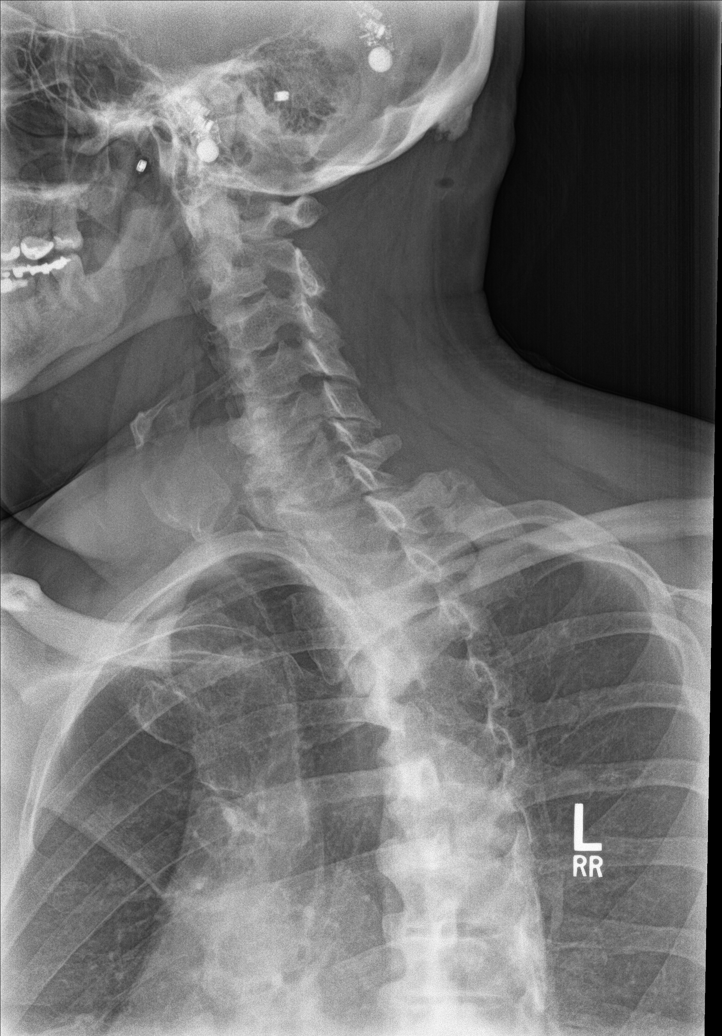

[c-spine ap]
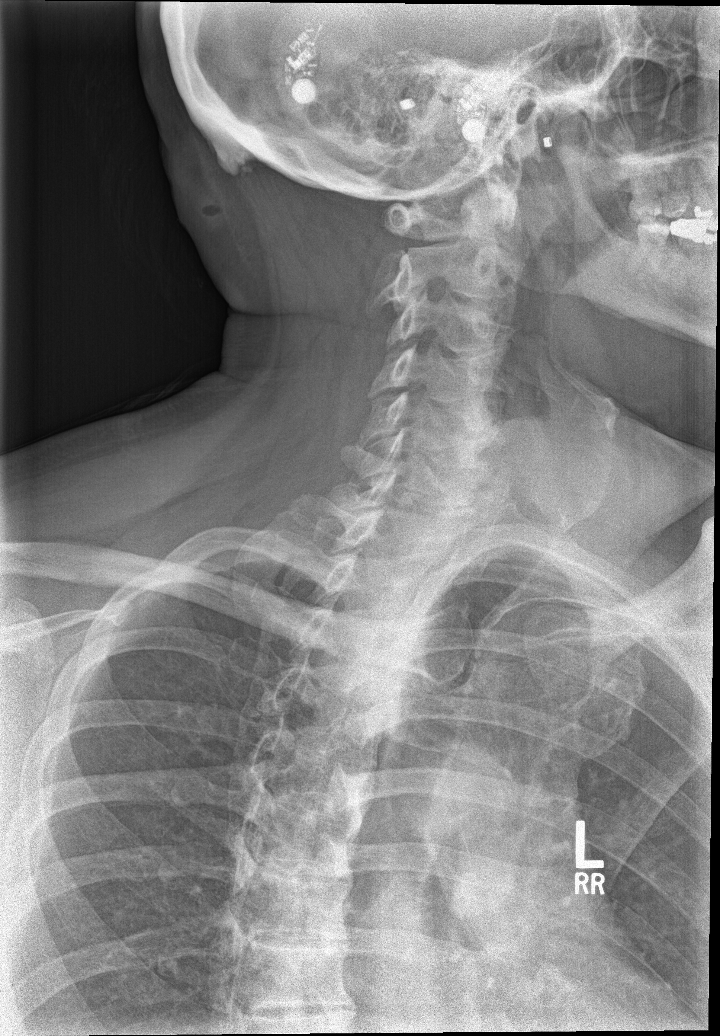

[c-spine open mouth]
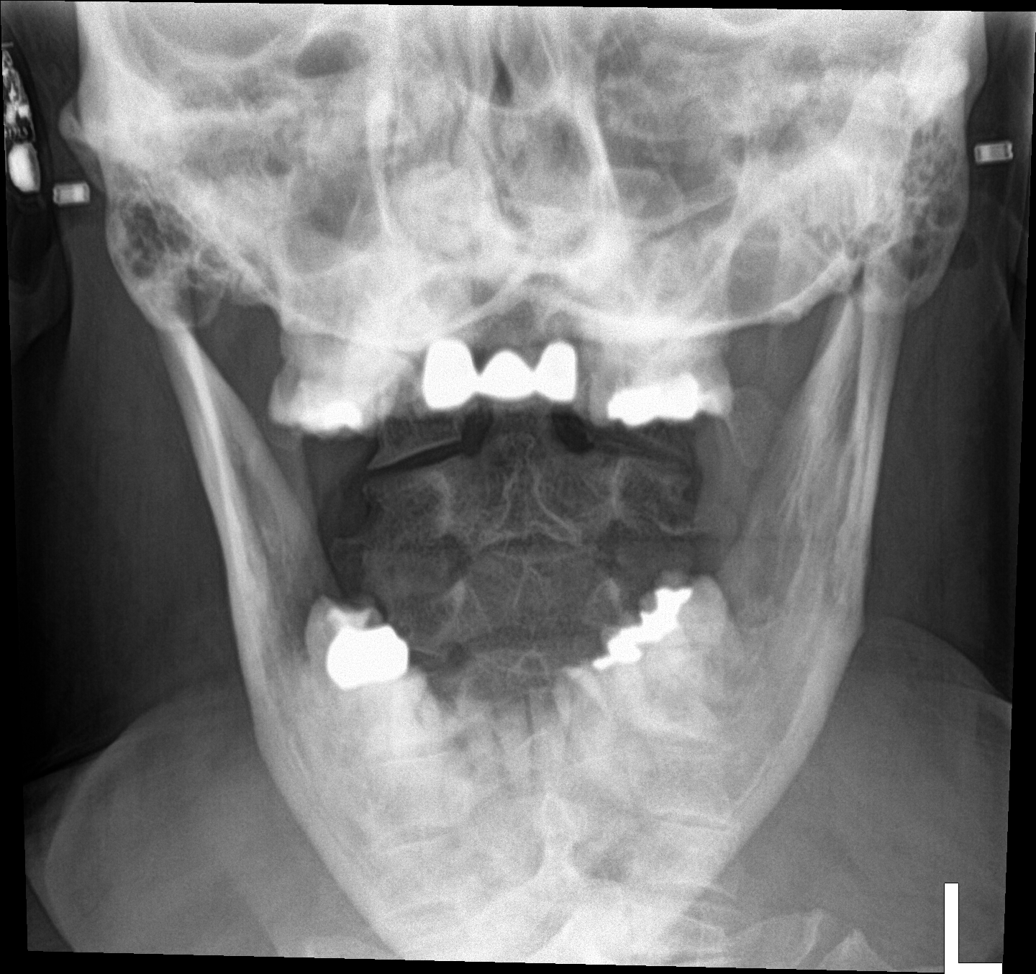

[[person_name]]
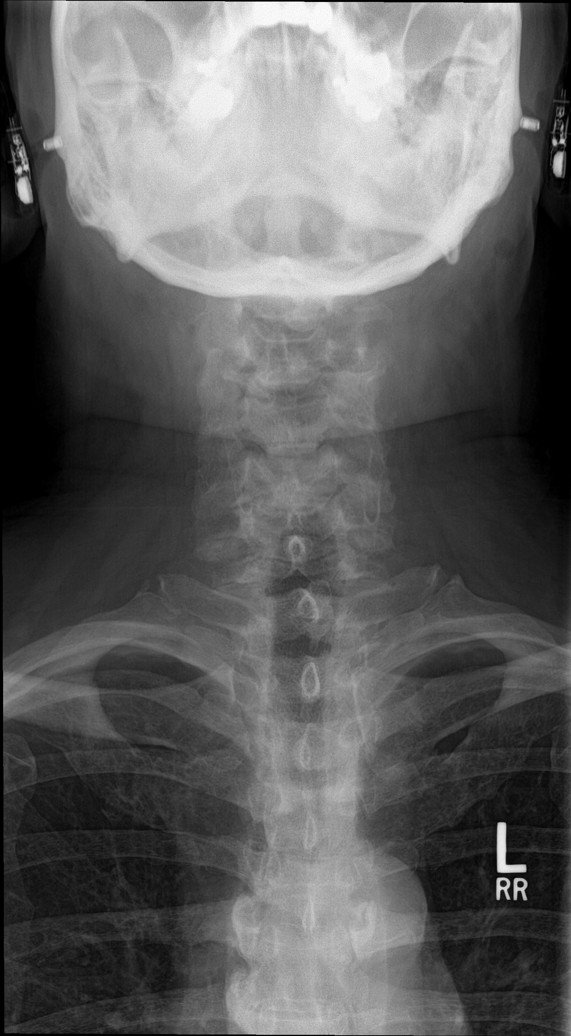

[6 of 6 positions shown; findings below may reference images not displayed]

FINDINGS: There is no evidence of cervical spine fracture or prevertebral soft
tissue swelling. Alignment is normal. Disc space narrowing C5-6 and
C6-7. Osseous spurring across the anterior margins of C4 and C5.
BILATERAL lower cervical foraminal narrowing. AP and odontoid views
are unremarkable.
IMPRESSION: No acute findings.  Cervical spondylosis, worst at C5-6 and C6-7.

## 2019-02-01 ENCOUNTER — Other Ambulatory Visit: Payer: Self-pay

## 2019-02-01 ENCOUNTER — Encounter: Payer: Self-pay | Admitting: Family Medicine

## 2019-02-01 ENCOUNTER — Ambulatory Visit: Payer: BLUE CROSS/BLUE SHIELD | Admitting: Family Medicine

## 2019-02-01 VITALS — BP 130/84 | HR 69 | Temp 97.9°F | Ht 71.0 in | Wt 267.6 lb

## 2019-02-01 DIAGNOSIS — S39012A Strain of muscle, fascia and tendon of lower back, initial encounter: Secondary | ICD-10-CM

## 2019-02-01 DIAGNOSIS — A09 Infectious gastroenteritis and colitis, unspecified: Secondary | ICD-10-CM | POA: Diagnosis not present

## 2019-02-01 DIAGNOSIS — I10 Essential (primary) hypertension: Secondary | ICD-10-CM

## 2019-02-01 MED ORDER — LISINOPRIL-HYDROCHLOROTHIAZIDE 10-12.5 MG PO TABS: 1.0000 | ORAL_TABLET | Freq: Every day | ORAL | 1 refills | Status: DC

## 2019-02-01 MED ORDER — AZITHROMYCIN 500 MG PO TABS: 1000.0000 mg | ORAL_TABLET | Freq: Once | ORAL | 0 refills | Status: AC

## 2019-02-01 MED ORDER — CYCLOBENZAPRINE HCL 5 MG PO TABS: ORAL_TABLET | ORAL | 0 refills | Status: DC

## 2019-02-01 NOTE — Patient Instructions (Addendum)
I suspect you had a low back strain that is improving.  Continue heat, gentle range of motion, ibuprofen only if needed.  I did write for a few muscle relaxants if needed but those should not be combined with other sedating medicines including Ambien.  Follow-up in the next 1 to 2 weeks if not continue to improve, sooner if worse.  I refilled the lisinopril HCTZ although that should have had another refill left from last time.  Azithromycin 2 pills once if needed for traveler's diarrhea as we discussed.  Thank you for coming in today   Low Back Strain  A strain is a stretch or tear in a muscle or the strong cords of tissue that attach muscle to bone (tendons). Strains of the lower back (lumbar spine) are a common cause of low back pain. A strain occurs when muscles or tendons are torn or are stretched beyond their limits. The muscles may become inflamed, resulting in pain and sudden muscle tightening (spasms). A strain can happen suddenly due to an injury (trauma), or it can develop gradually due to overuse. There are three types of strains:  Grade 1 is a mild strain involving a minor tear of the muscle fibers or tendons. This may cause some pain but no loss of muscle strength.  Grade 2 is a moderate strain involving a partial tear of the muscle fibers or tendons. This causes more severe pain and some loss of muscle strength.  Grade 3 is a severe strain involving a complete tear of the muscle or tendon. This causes severe pain and complete or nearly complete loss of muscle strength. What are the causes? This condition may be caused by:  Trauma, such as a fall or a hit to the body.  Twisting or overstretching the back. This may result from doing activities that require a lot of energy, such as lifting heavy objects. What increases the risk? The following factors may increase your risk of getting this condition:  Playing contact sports.  Participating in sports or activities that put  excessive stress on the back and require a lot of bending and twisting, including: ? Lifting weights or heavy objects. ? Gymnastics. ? Soccer. ? Figure skating. ? Snowboarding.  Being overweight or obese.  Having poor strength and flexibility. What are the signs or symptoms? Symptoms of this condition may include:  Sharp or dull pain in the lower back that does not go away. Pain may extend to the buttocks.  Stiffness.  Limited range of motion.  Inability to stand up straight due to stiffness or pain.  Muscle spasms. How is this diagnosed? This condition may be diagnosed based on:  Your symptoms.  Your medical history.  A physical exam. ? Your health care provider may push on certain areas of your back to determine the source of your pain. ? You may be asked to bend forward, backward, and side to side to assess the severity of your pain and your range of motion.  Imaging tests, such as: ? X-rays. ? MRI. How is this treated? Treatment for this condition may include:  Applying heat and cold to the affected area.  Medicines to help relieve pain and to relax your muscles (muscle relaxants).  NSAIDs to help reduce swelling and discomfort.  Physical therapy. When your symptoms improve, it is important to gradually return to your normal routine as soon as possible to reduce pain, avoid stiffness, and avoid loss of muscle strength. Generally, symptoms should improve within 6 weeks  of treatment. However, recovery time varies. Follow these instructions at home: Managing pain, stiffness, and swelling  If directed, apply ice to the injured area during the first 24 hours after your injury. ? Put ice in a plastic bag. ? Place a towel between your skin and the bag. ? Leave the ice on for 20 minutes, 2-3 times a day.  If directed, apply heat to the affected area as often as told by your health care provider. Use the heat source that your health care provider recommends, such as  a moist heat pack or a heating pad. ? Place a towel between your skin and the heat source. ? Leave the heat on for 20-30 minutes. ? Remove the heat if your skin turns bright red. This is especially important if you are unable to feel pain, heat, or cold. You may have a greater risk of getting burned. Activity  Rest and return to your normal activities as told by your health care provider. Ask your health care provider what activities are safe for you.  Avoid activities that take a lot of effort (are strenuous) for as long as told by your health care provider.  Do exercises as told by your health care provider. General instructions   Take over-the-counter and prescription medicines only as told by your health care provider.  If you have questions or concerns about safety while taking pain medicine, talk with your health care provider.  Do not drive or operate heavy machinery until you know how your pain medicine affects you.  Do not use any tobacco products, such as cigarettes, chewing tobacco, and e-cigarettes. Tobacco can delay bone healing. If you need help quitting, ask your health care provider.  Keep all follow-up visits as told by your health care provider. This is important. How is this prevented?               Warm up and stretch before being active.  Cool down and stretch after being active.  Give your body time to rest between periods of activity.  Avoid: ? Being physically inactive for long periods at a time. ? Exercising or playing sports when you are tired or in pain.  Use correct form when playing sports and lifting heavy objects.  Use good posture when sitting and standing.  Maintain a healthy weight.  Sleep on a mattress with medium firmness to support your back.  Make sure to use equipment that fits you, including shoes that fit well.  Be safe and responsible while being active to avoid falls.  Do at least 150 minutes of moderate-intensity  exercise each week, such as brisk walking or water aerobics. Try a form of exercise that takes stress off your back, such as swimming or stationary cycling.  Maintain physical fitness, including: ? Strength. ? Flexibility. ? Cardiovascular fitness. ? Endurance. Contact a health care provider if:  Your back pain does not improve after 6 weeks of treatment.  Your symptoms get worse. Get help right away if:  Your back pain is severe.  You are unable to stand or walk.  You develop pain in your legs.  You develop weakness in your buttocks or legs.  You have difficulty controlling when you urinate or when you have a bowel movement. This information is not intended to replace advice given to you by your health care provider. Make sure you discuss any questions you have with your health care provider. Document Released: 12/05/2005 Document Revised: 01/30/2017 Document Reviewed: 09/16/2015 Elsevier Interactive  Patient Education  [employer].   If you have lab work done today you will be contacted with your lab results within the next 2 weeks.  If you have not heard from [name] then please contact us. The fastest way to get your results is to register for My Chart.   IF you received an x-ray today, you will receive an invoice from [facility]. Please contact [facility] at [phone] with questions or concerns regarding your invoice.   IF you received labwork today, you will receive an invoice from [place]. Please contact [facility] at [phone] with questions or concerns regarding your invoice.   Our billing staff will not be able to assist you with questions regarding bills from these companies.  You will be contacted with the lab results as soon as they are available. The fastest way to get your results is to activate your My Chart account. Instructions are located on the last page of this paperwork. If you have not heard from [name] regarding the results in 2  weeks, please contact this office.

## 2019-02-01 NOTE — Progress Notes (Signed)
Subjective:    Patient ID: Warren Hill, male    DOB: May 02, 1956, 63 y.o.   MRN: [id number]  HPI  Warren Hill is a 63 y.o. male Presents today for: Chief Complaint  Patient presents with  . Back Pain    last tue lots of pain. from lifing something heavy (lumber) pain is now dull   . Medication Refill    refill of the cipro since he is going outside the country    Back pain: Lifting some heavy objects 6 days ago.  No initial pain/injury known.  Sore following day - lower back bilaterally.  More sharp pain 3 days ago.  Tx: ibuprofen 800mg  tid, heat Drove back from [place] with heat in seat.   Slight better over past 2 days. No ibuprofen this am yet. Still sore in low back, sore with mvmt. Better again today.  No bowel or bladder incontinence, no saddle anesthesia, no lower extremity weakness.  No radiating pain to legs.  Pharmacy d/n have refill of lisinopril.    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 Has used cipro if needed for travelers diarrhea. Will be going to [place]. Has not used cipro and had hives/rash with levaquin.    Patient Active Problem List   Diagnosis Date Noted  . HTN (hypertension) 01/18/2012  . Insomnia 01/18/2012  . Tinnitus 01/18/2012  . GERD 05/28/2008  . COLITIS 05/28/2008  . COLONIC POLYPS, ADENOMATOUS, HX OF 05/28/2008   Past Medical History:  Diagnosis Date  . Adenomatous polyp of colon   . GERD (gastroesophageal reflux disease)   . Hypertension   . Tinnitus of both ears    Past Surgical History:  Procedure Laterality Date  . COLONOSCOPY    . TONSILLECTOMY AND ADENOIDECTOMY  1962   Allergies  Allergen Reactions  . Levofloxacin Hives and Rash    Skin got like leather  . Penicillins Rash   Prior to Admission medications   Medication Sig Start Date End Date Taking? Authorizing Provider  aspirin 81 MG tablet Take 81 mg by mouth daily.     Yes [provider]  doxycycline (VIBRA-TABS) 100 MG tablet Take  100 mg by mouth 2 (two) times daily. 12/28/17  Yes [provider]  famotidine-calcium carbonate-magnesium hydroxide (PEPCID COMPLETE) 10-800-165 MG CHEW chewable tablet Chew 1 tablet by mouth daily as needed.   Yes [provider]  GRAPE SEED ER PO Take by mouth.   Yes [provider]  lisinopril-hydrochlorothiazide (PRINZIDE,ZESTORETIC) 10-12.5 MG tablet Take 1 tablet by mouth daily. 12/10/18  Yes [name], MD  Multiple Vitamins-Minerals (MULTIVITAMIN WITH MINERALS) tablet Take 1 tablet by mouth daily.     Yes [provider]  zolpidem (AMBIEN) 5 MG tablet Take 1-1.5 tablets (5-7.5 mg total) by mouth at bedtime as needed for sleep. 02/26/18  Yes [name], MD   Social History   Socioeconomic History  . Marital status: Married    Spouse name: Not on file  . Number of children: 3  . Years of education: Not on file  . Highest education level: Not on file  Occupational History  . Occupation: Scientist, clinical (histocompatibility and immunogenetics): [employer]  . Financial resource strain: Not on file  . Food insecurity:    Worry: Not on file    Inability: Not on file  . Transportation needs:    Medical: Not on file    Non-medical: Not on file  Tobacco Use  .  Smoking status: Never Smoker  . Smokeless tobacco: Never Used  Substance and Sexual Activity  . Alcohol use: Yes    Alcohol/week: 14.0 standard drinks    Types: 14 Glasses of wine per week  . Drug use: No  . Sexual activity: Yes    Birth control/protection: None  Lifestyle  . Physical activity:    Days per week: Not on file    Minutes per session: Not on file  . Stress: Not on file  Relationships  . Social connections:    Talks on phone: Not on file    Gets together: Not on file    Attends religious service: Not on file    Active member of club or organization: Not on file    Attends meetings of clubs or organizations: Not on file    Relationship status: Not on file  . Intimate partner  violence:    Fear of current or ex partner: Not on file    Emotionally abused: Not on file    Physically abused: Not on file    Forced sexual activity: Not on file  Other Topics Concern  . Not on file  Social History Narrative  . Not on file    Review of Systems  Constitutional: Negative for chills, diaphoresis, fever and unexpected weight change.  Musculoskeletal: Positive for arthralgias, back pain and myalgias.  Skin: Negative for wound.       Objective:   Physical Exam Constitutional:      General: He is not in acute distress.    Appearance: He is well-developed.  HENT:     Head: Normocephalic and atraumatic.  Cardiovascular:     Rate and Rhythm: Normal rate.  Pulmonary:     Effort: Pulmonary effort is normal.  Musculoskeletal:     Lumbar back: He exhibits spasm (min paraspinals bilaterally). He exhibits normal range of motion (flex 90. ), no tenderness and no bony tenderness.     Comments: Locates prior discomfort to paraspinals of lower lumbar spine, no midline bony tenderness.  Able to heel and toe walk without difficulty.  Neurological:     Mental Status: He is alert and oriented to person, place, and time.    Vitals:   02/01/19 1118  BP: 130/84  Pulse: 69  Temp: 97.9 F (36.6 C)  TempSrc: Oral  SpO2: 97%  Weight: 267 lb 9.6 oz (121.4 kg)  Height: 5\' 11"  (1.803 m)       Assessment & Plan:    Warren Hill is a 63 y.o. male Strain of lumbar region, initial encounter - Plan: cyclobenzaprine (FLEXERIL) 5 MG tablet  -Lumbar strain that is improving.  Flexeril prescribed if needed, but continue symptomatic care.  Potential med side effects discussed, RTC precautions given.  Handout given.  Essential hypertension - Plan: lisinopril-hydrochlorothiazide (PRINZIDE,ZESTORETIC) 10-12.5 MG tablet  -Refill reordered.  Traveler's diarrhea - Plan: azithromycin (ZITHROMAX) 500 MG tablet  -Due to quinolone allergy, changed to azithromycin, especially with potential  risk of Campylobacter.  Potential need and usage instructions discussed for traveler's diarrhea.  Understanding expressed.  Meds ordered this encounter  Medications  . lisinopril-hydrochlorothiazide (PRINZIDE,ZESTORETIC) 10-12.5 MG tablet    Sig: Take 1 tablet by mouth daily.    Dispense:  90 tablet    Refill:  1  . azithromycin (ZITHROMAX) 500 MG tablet    Sig: Take 2 tablets (1,000 mg total) by mouth once for 1 dose. For travelers diarrhea if needed.    Dispense:  2 tablet  Refill:  0  . cyclobenzaprine (FLEXERIL) 5 MG tablet    Sig: 1 pill by mouth up to every 8 hours as needed. Start with one pill by mouth each bedtime as needed due to sedation    Dispense:  15 tablet    Refill:  0   Patient Instructions   I suspect you had a low back strain that is improving.  Continue heat, gentle range of motion, ibuprofen only if needed.  I did write for a few muscle relaxants if needed but those should not be combined with other sedating medicines including Ambien.  Follow-up in the next 1 to 2 weeks if not continue to improve, sooner if worse.  I refilled the lisinopril HCTZ although that should have had another refill left from last time.  Azithromycin 2 pills once if needed for traveler's diarrhea as we discussed.  Thank you for coming in today   Low Back Strain  A strain is a stretch or tear in a muscle or the strong cords of tissue that attach muscle to bone (tendons). Strains of the lower back (lumbar spine) are a common cause of low back pain. A strain occurs when muscles or tendons are torn or are stretched beyond their limits. The muscles may become inflamed, resulting in pain and sudden muscle tightening (spasms). A strain can happen suddenly due to an injury (trauma), or it can develop gradually due to overuse. There are three types of strains:  Grade 1 is a mild strain involving a minor tear of the muscle fibers or tendons. This may cause some pain but no loss of muscle  strength.  Grade 2 is a moderate strain involving a partial tear of the muscle fibers or tendons. This causes more severe pain and some loss of muscle strength.  Grade 3 is a severe strain involving a complete tear of the muscle or tendon. This causes severe pain and complete or nearly complete loss of muscle strength. What are the causes? This condition may be caused by:  Trauma, such as a fall or a hit to the body.  Twisting or overstretching the back. This may result from doing activities that require a lot of energy, such as lifting heavy objects. What increases the risk? The following factors may increase your risk of getting this condition:  Playing contact sports.  Participating in sports or activities that put excessive stress on the back and require a lot of bending and twisting, including: ? Lifting weights or heavy objects. ? Gymnastics. ? Soccer. ? Figure skating. ? Snowboarding.  Being overweight or obese.  Having poor strength and flexibility. What are the signs or symptoms? Symptoms of this condition may include:  Sharp or dull pain in the lower back that does not go away. Pain may extend to the buttocks.  Stiffness.  Limited range of motion.  Inability to stand up straight due to stiffness or pain.  Muscle spasms. How is this diagnosed? This condition may be diagnosed based on:  Your symptoms.  Your medical history.  A physical exam. ? Your health care provider may push on certain areas of your back to determine the source of your pain. ? You may be asked to bend forward, backward, and side to side to assess the severity of your pain and your range of motion.  Imaging tests, such as: ? X-rays. ? MRI. How is this treated? Treatment for this condition may include:  Applying heat and cold to the affected area.  Medicines to  help relieve pain and to relax your muscles (muscle relaxants).  NSAIDs to help reduce swelling and discomfort.  Physical  therapy. When your symptoms improve, it is important to gradually return to your normal routine as soon as possible to reduce pain, avoid stiffness, and avoid loss of muscle strength. Generally, symptoms should improve within 6 weeks of treatment. However, recovery time varies. Follow these instructions at home: Managing pain, stiffness, and swelling  If directed, apply ice to the injured area during the first 24 hours after your injury. ? Put ice in a plastic bag. ? Place a towel between your skin and the bag. ? Leave the ice on for 20 minutes, 2-3 times a day.  If directed, apply heat to the affected area as often as told by your health care provider. Use the heat source that your health care provider recommends, such as a moist heat pack or a heating pad. ? Place a towel between your skin and the heat source. ? Leave the heat on for 20-30 minutes. ? Remove the heat if your skin turns bright red. This is especially important if you are unable to feel pain, heat, or cold. You may have a greater risk of getting burned. Activity  Rest and return to your normal activities as told by your health care provider. Ask your health care provider what activities are safe for you.  Avoid activities that take a lot of effort (are strenuous) for as long as told by your health care provider.  Do exercises as told by your health care provider. General instructions   Take over-the-counter and prescription medicines only as told by your health care provider.  If you have questions or concerns about safety while taking pain medicine, talk with your health care provider.  Do not drive or operate heavy machinery until you know how your pain medicine affects you.  Do not use any tobacco products, such as cigarettes, chewing tobacco, and e-cigarettes. Tobacco can delay bone healing. If you need help quitting, ask your health care provider.  Keep all follow-up visits as told by your health care provider.  This is important. How is this prevented?               Warm up and stretch before being active.  Cool down and stretch after being active.  Give your body time to rest between periods of activity.  Avoid: ? Being physically inactive for long periods at a time. ? Exercising or playing sports when you are tired or in pain.  Use correct form when playing sports and lifting heavy objects.  Use good posture when sitting and standing.  Maintain a healthy weight.  Sleep on a mattress with medium firmness to support your back.  Make sure to use equipment that fits you, including shoes that fit well.  Be safe and responsible while being active to avoid falls.  Do at least 150 minutes of moderate-intensity exercise each week, such as brisk walking or water aerobics. Try a form of exercise that takes stress off your back, such as swimming or stationary cycling.  Maintain physical fitness, including: ? Strength. ? Flexibility. ? Cardiovascular fitness. ? Endurance. Contact a health care provider if:  Your back pain does not improve after 6 weeks of treatment.  Your symptoms get worse. Get help right away if:  Your back pain is severe.  You are unable to stand or walk.  You develop pain in your legs.  You develop weakness in your buttocks  or legs.  You have difficulty controlling when you urinate or when you have a bowel movement. This information is not intended to replace advice given to you by your health care provider. Make sure you discuss any questions you have with your health care provider. Document Released: 12/05/2005 Document Revised: 01/30/2017 Document Reviewed: 09/16/2015 Elsevier Interactive Patient Education  [employer].   If you have lab work done today you will be contacted with your lab results within the next 2 weeks.  If you have not heard from [name] then please contact us. The fastest way to get your results is to register for My  Chart.   IF you received an x-ray today, you will receive an invoice from [facility]. Please contact [facility] at [phone] with questions or concerns regarding your invoice.   IF you received labwork today, you will receive an invoice from [place]. Please contact [facility] at [phone] with questions or concerns regarding your invoice.   Our billing staff will not be able to assist you with questions regarding bills from these companies.  You will be contacted with the lab results as soon as they are available. The fastest way to get your results is to activate your My Chart account. Instructions are located on the last page of this paperwork. If you have not heard from [name] regarding the results in 2 weeks, please contact this office.       Signed,   Merri Ray, MD [facility].  02/02/19 12:29 PM

## 2019-03-22 ENCOUNTER — Encounter: Payer: BLUE CROSS/BLUE SHIELD | Admitting: Family Medicine

## 2019-07-05 ENCOUNTER — Ambulatory Visit (INDEPENDENT_AMBULATORY_CARE_PROVIDER_SITE_OTHER): Payer: BC Managed Care – PPO | Admitting: Family Medicine

## 2019-07-05 ENCOUNTER — Encounter: Payer: Self-pay | Admitting: Family Medicine

## 2019-07-05 ENCOUNTER — Other Ambulatory Visit: Payer: Self-pay

## 2019-07-05 VITALS — BP 145/80 | HR 105 | Temp 98.0°F | Resp 17 | Ht 71.0 in | Wt 229.6 lb

## 2019-07-05 DIAGNOSIS — R350 Frequency of micturition: Secondary | ICD-10-CM

## 2019-07-05 DIAGNOSIS — I10 Essential (primary) hypertension: Secondary | ICD-10-CM | POA: Diagnosis not present

## 2019-07-05 DIAGNOSIS — Z23 Encounter for immunization: Secondary | ICD-10-CM

## 2019-07-05 DIAGNOSIS — G4700 Insomnia, unspecified: Secondary | ICD-10-CM | POA: Diagnosis not present

## 2019-07-05 DIAGNOSIS — Z125 Encounter for screening for malignant neoplasm of prostate: Secondary | ICD-10-CM

## 2019-07-05 DIAGNOSIS — E785 Hyperlipidemia, unspecified: Secondary | ICD-10-CM | POA: Diagnosis not present

## 2019-07-05 DIAGNOSIS — Z0001 Encounter for general adult medical examination with abnormal findings: Secondary | ICD-10-CM | POA: Diagnosis not present

## 2019-07-05 DIAGNOSIS — R1310 Dysphagia, unspecified: Secondary | ICD-10-CM

## 2019-07-05 DIAGNOSIS — H9313 Tinnitus, bilateral: Secondary | ICD-10-CM

## 2019-07-05 DIAGNOSIS — Z0000 Encounter for general adult medical examination without abnormal findings: Secondary | ICD-10-CM

## 2019-07-05 LAB — POCT URINALYSIS DIP (MANUAL ENTRY)
Bilirubin, UA: NEGATIVE
Blood, UA: NEGATIVE
Glucose, UA: NEGATIVE mg/dL
Ketones, POC UA: NEGATIVE mg/dL
Leukocytes, UA: NEGATIVE
Nitrite, UA: NEGATIVE
Protein Ur, POC: NEGATIVE mg/dL
Spec Grav, UA: <=1.005 — AB (ref 1.010–1.025)
Urobilinogen, UA: 0.2 E.U./dL
pH, UA: 6 (ref 5.0–8.0)

## 2019-07-05 MED ORDER — ZOLPIDEM TARTRATE 5 MG PO TABS: 5.0000 mg | ORAL_TABLET | Freq: Every evening | ORAL | 3 refills | Status: DC | PRN

## 2019-07-05 MED ORDER — SHINGRIX 50 MCG/0.5ML IM SUSR: 0.5000 mL | Freq: Once | INTRAMUSCULAR | 1 refills | Status: AC

## 2019-07-05 MED ORDER — LISINOPRIL-HYDROCHLOROTHIAZIDE 10-12.5 MG PO TABS: 1.0000 | ORAL_TABLET | Freq: Every day | ORAL | 1 refills | Status: DC

## 2019-07-05 NOTE — Patient Instructions (Addendum)
No change in medications today.  I will refer you to ear nose and throat to evaluate the throat symptoms with swallowing pills.  If no source found there, can consider gastroenterology evaluation. Frequent urination may be due to increased fluid intake, but I will check a urine test for infection as well as a prostate test.  We can discuss this further at follow-up in the next month.  Can also discuss any back symptom flares at that time or other concerns if I did not have a chance to get those today.  If any acute worsening of other concerns, please be seen sooner.  Great work on the weight loss, diet changes and exercise!  Thanks again for coming in to see [name] today.  Stay safe.     If you have lab work done today you will be contacted with your lab results within the next 2 weeks.  If you have not heard from [name] then please contact us. The fastest way to get your results is to register for My Chart.   IF you received an x-ray today, you will receive an invoice from [facility]. Please contact [facility] at [phone] with questions or concerns regarding your invoice.   IF you received labwork today, you will receive an invoice from [place]. Please contact [facility] at [phone] with questions or concerns regarding your invoice.   Our billing staff will not be able to assist you with questions regarding bills from these companies.  You will be contacted with the lab results as soon as they are available. The fastest way to get your results is to activate your My Chart account. Instructions are located on the last page of this paperwork. If you have not heard from [name] regarding the results in 2 weeks, please contact this office.

## 2019-07-05 NOTE — Progress Notes (Signed)
Subjective:    Patient ID: Warren Hill, male    DOB: 1956/11/11, 63 y.o.   MRN: [id number]  HPI Warren Hill is a 63 y.o. male Presents today for: Chief Complaint  Patient presents with  . Annual Exam    cpe   Hypertension: BP Readings from Last 3 Encounters:  07/05/19 (!) 145/80  02/01/19 130/84  12/04/18 131/81   Lab Results  Component Value Date   CREATININE 0.93 12/07/2018  Lisinopril HCTZ 10/12.5 mg daily. No home readings  No new side effects with meds. No missed doses.   Insomnia Ambien 5 to 7.5 mg as needed for sleep. No recent travel.  PDMP reviewed, last filled August 2019. No recent refill - needs updated Rx.  Few times per month No further snoring and acid reflux improved with wt loss. No daytime somnolence.   Eating healthier foods - easier without traveling. walking 1 hour per day. 2200 cals per day.  Wt Readings from Last 3 Encounters:  07/05/19 229 lb 9.6 oz (104.1 kg)  02/01/19 267 lb 9.6 oz (121.4 kg)  12/04/18 261 lb (118.4 kg)    Cancer screening: Colonoscopy 5-18, repeat 5 years Prostate, PSA normal 0.8 in March 2019.agrees to PSA only.   Immunization History  Administered Date(s) Administered  . Influenza Split 09/22/2012  . Influenza, Quadrivalent, Recombinant, Inj, Pf 08/26/2018  . Influenza,inj,Quad PF,6+ Mos 08/06/2014, 10/16/2015, 10/08/2016, 08/26/2017  . Tdap 12/19/2010  shingles - pharmacy out, but called back and not yet received. Needs new Rx.    Depression screen [facility] 2/9 07/05/2019 12/04/2018 04/02/2018 03/08/2018 02/26/2018  Decreased Interest 0 0 0 0 0  Down, Depressed, Hopeless 0 0 0 0 0  PHQ - 2 Score 0 0 0 0 0    Hearing Screening   125Hz  250Hz  500Hz  1000Hz  2000Hz  3000Hz  4000Hz  6000Hz  8000Hz   Right ear:           Left ear:             Visual Acuity Screening   Right eye Left eye Both eyes  Without correction:     With correction: 20/30 20/30 20/20   last optho exam - March - new rx.   Dental: every 6 months. Last  appt in April.   Exercise: as above.   Throat irritation: Trouble swallowing certain pills past few months.  Feels like stuck in throat, but passing. Noted more in past 6 weeks. No vomiting. Feels more symptoms past 6 weeks. Gelcap. Blood pressure pill at times. No chest pains. No DOE.  Tries to cough, but nothing in throat.  No fever, no shortness of breath, no covid 19 exposure.  Able to drink water, no problems with passing Acid reflux improved past few months, no pepcid complete in  Over 2 months.  2 glasses red wine per day. Chronic tinnitus, both ears, seen by ENT Dr. Thornell Mule prior. Wears hearing aids. Last seen by Dr. Cresenciano Lick few years ago.   Urinary frequency: 2 episodes nocturia per night, past 6 months, prior once per night or not at all. Rare beer , usually wine as above.  Drinking more water during the day with healthier habits and exercise, some increased daytime urination - past few months. No n/v, no new blurry vision. No increased thirst.   Patient Active Problem List   Diagnosis Date Noted  . HTN (hypertension) 01/18/2012  . Insomnia 01/18/2012  . Tinnitus 01/18/2012  . GERD 05/28/2008  . COLITIS 05/28/2008  . COLONIC POLYPS, ADENOMATOUS, HX OF  05/28/2008   Past Medical History:  Diagnosis Date  . Adenomatous polyp of colon   . GERD (gastroesophageal reflux disease)   . Hypertension   . Tinnitus of both ears    Past Surgical History:  Procedure Laterality Date  . COLONOSCOPY    . TONSILLECTOMY AND ADENOIDECTOMY  1962   Allergies  Allergen Reactions  . Levofloxacin Hives and Rash    Skin got like leather  . Penicillins Rash   Prior to Admission medications   Medication Sig Start Date End Date Taking? Authorizing Provider  GRAPE SEED ER PO Take by mouth.   Yes [provider]  lisinopril-hydrochlorothiazide (PRINZIDE,ZESTORETIC) 10-12.5 MG tablet Take 1 tablet by mouth daily. 02/01/19  Yes [name], MD  zolpidem (AMBIEN) 5 MG tablet Take  1-1.5 tablets (5-7.5 mg total) by mouth at bedtime as needed for sleep. 02/26/18  Yes [name], MD  aspirin 81 MG tablet Take 81 mg by mouth daily.      [provider]  cyclobenzaprine (FLEXERIL) 5 MG tablet 1 pill by mouth up to every 8 hours as needed. Start with one pill by mouth each bedtime as needed due to sedation Patient not taking: Reported on 07/05/2019 02/01/19   [name], MD  doxycycline (VIBRA-TABS) 100 MG tablet Take 100 mg by mouth 2 (two) times daily. 12/28/17   [provider]  famotidine-calcium carbonate-magnesium hydroxide (PEPCID COMPLETE) 10-800-165 MG CHEW chewable tablet Chew 1 tablet by mouth daily as needed.    [provider]  Multiple Vitamins-Minerals (MULTIVITAMIN WITH MINERALS) tablet Take 1 tablet by mouth daily.      [provider]   Social History   Socioeconomic History  . Marital status: Married    Spouse name: Not on file  . Number of children: 3  . Years of education: Not on file  . Highest education level: Not on file  Occupational History  . Occupation: Scientist, clinical (histocompatibility and immunogenetics): [employer]  . Financial resource strain: Not on file  . Food insecurity    Worry: Not on file    Inability: Not on file  . Transportation needs    Medical: Not on file    Non-medical: Not on file  Tobacco Use  . Smoking status: Never Smoker  . Smokeless tobacco: Never Used  Substance and Sexual Activity  . Alcohol use: Yes    Alcohol/week: 14.0 standard drinks    Types: 14 Glasses of wine per week  . Drug use: No  . Sexual activity: Yes    Birth control/protection: None  Lifestyle  . Physical activity    Days per week: Not on file    Minutes per session: Not on file  . Stress: Not on file  Relationships  . Social Herbalist on phone: Not on file    Gets together: Not on file    Attends religious service: Not on file    Active member of club or organization: Not on file    Attends  meetings of clubs or organizations: Not on file    Relationship status: Not on file  . Intimate partner violence    Fear of current or ex partner: Not on file    Emotionally abused: Not on file    Physically abused: Not on file    Forced sexual activity: Not on file  Other Topics Concern  . Not on file  Social History Narrative  . Not on  file     Review of Systems 13 point review of systems per patient health survey noted.  Negative other than as indicated above or in HPI.      Objective:   Physical Exam Vitals signs reviewed.  Constitutional:      Appearance: He is well-developed.  HENT:     Head: Normocephalic and atraumatic.     Right Ear: External ear normal.     Left Ear: External ear normal.     Mouth/Throat:     Mouth: Mucous membranes are moist. No oral lesions.     Palate: No mass and lesions.     Pharynx: Oropharynx is clear. No oropharyngeal exudate or posterior oropharyngeal erythema.     Tonsils: No tonsillar exudate.  Eyes:     Conjunctiva/sclera: Conjunctivae normal.     Pupils: Pupils are equal, round, and reactive to light.  Neck:     Musculoskeletal: Normal range of motion and neck supple.     Thyroid: No thyroid mass, thyromegaly or thyroid tenderness.     Trachea: Trachea normal.  Cardiovascular:     Rate and Rhythm: Normal rate and regular rhythm.     Heart sounds: Normal heart sounds.  Pulmonary:     Effort: Pulmonary effort is normal. No respiratory distress.     Breath sounds: Normal breath sounds. No wheezing.  Abdominal:     General: There is no distension.     Palpations: Abdomen is soft.     Tenderness: There is no abdominal tenderness.     Hernia: A hernia is present.  Musculoskeletal: Normal range of motion.        General: No tenderness.  Lymphadenopathy:     Cervical: No cervical adenopathy.  Skin:    General: Skin is warm and dry.  Neurological:     Mental Status: He is alert and oriented to person, place, and time.     Deep  Tendon Reflexes: Reflexes are normal and symmetric.  Psychiatric:        Behavior: Behavior normal.    Vitals:   07/05/19 1413  BP: (!) 145/80  Pulse: (!) 105  Resp: 17  Temp: 98 F (36.7 C)  TempSrc: Oral  SpO2: 99%  Weight: 229 lb 9.6 oz (104.1 kg)  Height: 5\' 11"  (1.803 m)      Assessment & Plan:   Warren Hill is a 63 y.o. male Annual physical exam  - -anticipatory guidance as below in AVS, screening labs above. Health maintenance items as above in HPI discussed/recommended as applicable.   -Commended on his significant weight loss with diet changes and exercise.  Screening for prostate cancer - Plan: PSA  -We discussed pros and cons of prostate cancer screening, and after this discussion, he chose to have screening done with PSA only. Limitations without DRE discussed.   Essential hypertension - Plan: Lipid panel, CBC, lisinopril-hydrochlorothiazide (ZESTORETIC) 10-12.5 MG tablet  -improved on repeat exam. No med changes, labs pending.  Continue to monitor as may not need as much medication now that he has lost weight.  Hyperlipidemia, unspecified hyperlipidemia type - Plan: Comprehensive metabolic panel, CBC  -Borderline previously, commended on diet/exercise which should help.  No current meds.  Insomnia, unspecified type - Plan: zolpidem (AMBIEN) 5 MG tablet  -Rare use of Ambien, effective when needed.  Refilled for as needed use.  Need for shingles vaccine - Plan: Zoster Vaccine Adjuvanted [facility]) injection  -Sent to pharmacy  Pill dysphagia - Plan: Ambulatory referral to ENT  -  Less likely GERD/esophagitis as his reflux symptoms have improved and off meds.  Warren initially start with ENT evaluation, then if no source found, consider GI eval for possible endoscopy.  RTC precautions if other dysphasia or worsening symptoms  Urinary frequency - Plan: POCT urinalysis dipstick  -May be related to increased fluid intake.  Warren check PSA, no concerning findings on in  office urinalysis  Tinnitus of both ears  -Longstanding issue, wears hearing aids.  Warren be evaluated by ENT for pill esophagitis, can discuss with them if needed.  Plan on follow-up in the next 1 month, and can review any other concerns at that time including episodic low back pain which has been treated in the past.  RTC precautions if any acute changes or worsening symptoms in the interim.  Meds ordered this encounter  Medications  . lisinopril-hydrochlorothiazide (ZESTORETIC) 10-12.5 MG tablet    Sig: Take 1 tablet by mouth daily.    Dispense:  90 tablet    Refill:  1  . zolpidem (AMBIEN) 5 MG tablet    Sig: Take 1-1.5 tablets (5-7.5 mg total) by mouth at bedtime as needed for sleep.    Dispense:  15 tablet    Refill:  3  . Zoster Vaccine Adjuvanted [facility]) injection    Sig: Inject 0.5 mLs into the muscle once for 1 dose. Repeat in 2-6 months.    Dispense:  0.5 mL    Refill:  1   Patient Instructions   No change in medications today.  I Warren refer you to ear nose and throat to evaluate the throat symptoms with swallowing pills.  If no source found there, can consider gastroenterology evaluation. Frequent urination may be due to increased fluid intake, but I Warren check a urine test for infection as well as a prostate test.  We can discuss this further at follow-up in the next month.  Can also discuss any back symptom flares at that time or other concerns if I did not have a chance to get those today.  If any acute worsening of other concerns, please be seen sooner.  Great work on the weight loss, diet changes and exercise!  Thanks again for coming in to see [name] today.  Stay safe.     If you have lab work done today you Warren be contacted with your lab results within the next 2 weeks.  If you have not heard from [name] then please contact us. The fastest way to get your results is to register for My Chart.   IF you received an x-ray today, you Warren receive an invoice from  [facility]. Please contact [facility] at [phone] with questions or concerns regarding your invoice.   IF you received labwork today, you Warren receive an invoice from [facility]. Please contact [facility] at [phone] with questions or concerns regarding your invoice.   Our billing staff Warren not be able to assist you with questions regarding bills from these companies.  You Warren be contacted with the lab results as soon as they are available. The fastest way to get your results is to activate your My Chart account. Instructions are located on the last page of this paperwork. If you have not heard from [name] regarding the results in 2 weeks, please contact this office.      Signed,   Merri Ray, MD [facility].  07/06/19 3:36 PM

## 2019-07-06 ENCOUNTER — Encounter: Payer: Self-pay | Admitting: Family Medicine

## 2019-07-06 LAB — COMPREHENSIVE METABOLIC PANEL
ALT: 27 IU/L (ref 0–44)
AST: 25 IU/L (ref 0–40)
Albumin/Globulin Ratio: 2 (ref 1.2–2.2)
Albumin: 4.8 g/dL (ref 3.8–4.8)
Alkaline Phosphatase: 61 IU/L (ref 39–117)
BUN/Creatinine Ratio: 16 (ref 10–24)
BUN: 15 mg/dL (ref 8–27)
Bilirubin Total: 1 mg/dL (ref 0.0–1.2)
CO2: 22 mmol/L (ref 20–29)
Calcium: 9.6 mg/dL (ref 8.6–10.2)
Chloride: 95 mmol/L — ABNORMAL LOW (ref 96–106)
Creatinine, Ser: 0.93 mg/dL (ref 0.76–1.27)
GFR calc Af Amer: 101 mL/min/{1.73_m2} (ref >59)
GFR calc non Af Amer: 87 mL/min/{1.73_m2} (ref >59)
Globulin, Total: 2.4 g/dL (ref 1.5–4.5)
Glucose: 90 mg/dL (ref 65–99)
Potassium: 4 mmol/L (ref 3.5–5.2)
Sodium: 140 mmol/L (ref 134–144)
Total Protein: 7.2 g/dL (ref 6.0–8.5)

## 2019-07-06 LAB — CBC
Hematocrit: 47.5 % (ref 37.5–51.0)
Hemoglobin: 16 g/dL (ref 13.0–17.7)
MCH: 31.5 pg (ref 26.6–33.0)
MCHC: 33.7 g/dL (ref 31.5–35.7)
MCV: 94 fL (ref 79–97)
Platelets: 157 10*3/uL (ref 150–450)
RBC: 5.08 x10E6/uL (ref 4.14–5.80)
RDW: 12.6 % (ref 11.6–15.4)
WBC: 7.7 10*3/uL (ref 3.4–10.8)

## 2019-07-06 LAB — LIPID PANEL
Chol/HDL Ratio: 3.5 ratio (ref 0.0–5.0)
Cholesterol, Total: 171 mg/dL (ref 100–199)
HDL: 49 mg/dL (ref >39)
LDL Calculated: 104 mg/dL — ABNORMAL HIGH (ref 0–99)
Triglycerides: 90 mg/dL (ref 0–149)
VLDL Cholesterol Cal: 18 mg/dL (ref 5–40)

## 2019-07-06 LAB — PSA: Prostate Specific Ag, Serum: 0.8 ng/mL (ref 0.0–4.0)

## 2019-09-30 ENCOUNTER — Ambulatory Visit (INDEPENDENT_AMBULATORY_CARE_PROVIDER_SITE_OTHER): Payer: BC Managed Care – PPO

## 2019-09-30 ENCOUNTER — Other Ambulatory Visit: Payer: Self-pay

## 2019-09-30 ENCOUNTER — Ambulatory Visit (INDEPENDENT_AMBULATORY_CARE_PROVIDER_SITE_OTHER): Payer: BC Managed Care – PPO | Admitting: Family Medicine

## 2019-09-30 ENCOUNTER — Encounter: Payer: Self-pay | Admitting: Family Medicine

## 2019-09-30 VITALS — BP 116/70 | HR 77 | Temp 98.6°F | Wt 220.6 lb

## 2019-09-30 DIAGNOSIS — M545 Low back pain, unspecified: Secondary | ICD-10-CM

## 2019-09-30 DIAGNOSIS — R350 Frequency of micturition: Secondary | ICD-10-CM

## 2019-09-30 DIAGNOSIS — R351 Nocturia: Secondary | ICD-10-CM | POA: Diagnosis not present

## 2019-09-30 LAB — POC MICROSCOPIC URINALYSIS (UMFC): Mucus: ABSENT

## 2019-09-30 LAB — POCT URINALYSIS DIP (MANUAL ENTRY)
Bilirubin, UA: NEGATIVE
Blood, UA: NEGATIVE
Glucose, UA: NEGATIVE mg/dL
Ketones, POC UA: NEGATIVE mg/dL
Leukocytes, UA: NEGATIVE
Nitrite, UA: NEGATIVE
Protein Ur, POC: NEGATIVE mg/dL
Spec Grav, UA: 1.02 (ref 1.010–1.025)
Urobilinogen, UA: 0.2 E.U./dL
pH, UA: 7.5 (ref 5.0–8.0)

## 2019-09-30 NOTE — Progress Notes (Signed)
Subjective:    Patient ID: Warren Hill, male    DOB: 11-20-1956, 63 y.o.   MRN: [id number]  HPI Warren Hill is a 63 y.o. male Presents today for: Chief Complaint  Patient presents with  . Follow-up    here today for a f/u from 07/05/19 visit. Was seen by ENT and came back normal. Still have Frequency, Still have right back pain.    Urinary frequency: Normal PSA 0.8, reassuring urinalysis last visit. Has been losing weight - intentional. Calorie management, and 16mi walk per day.  Notes more frequency with urination vs years ago.  Some urgency. Rare urgency.  Less pressure with urination vs 1 year ago - decreased flow/slower stream.  more frequent urination with driving -  Up to every hour, sometimes hrs. Nocturia: 2 times per night.  Symptoms since March.   Back pain: Usually lower back - both sides, thought to be muscular. Sharp pain on R today No hematuria.  No hx of kidney stones.  No bowel or bladder incontinence, no saddle anesthesia, no lower extremity weakness.  No change with wt loss.  No n/v. Prior reflux sx resolved with wt loss.  Tx: none.    Wt Readings from Last 3 Encounters:  09/30/19 220 lb 9.6 oz (100.1 kg)  07/05/19 229 lb 9.6 oz (104.1 kg)  02/01/19 267 lb 9.6 oz (121.4 kg)      Results for orders placed or performed in visit on 07/05/19  Comprehensive metabolic panel  Result Value Ref Range   Glucose 90 65 - 99 mg/dL   BUN 15 8 - 27 mg/dL   Creatinine, Ser 0.93 0.76 - 1.27 mg/dL   GFR calc non Af Amer 87 >59 mL/min/1.73   GFR calc Af Amer 101 >59 mL/min/1.73   BUN/Creatinine Ratio 16 10 - 24   Sodium 140 134 - 144 mmol/L   Potassium 4.0 3.5 - 5.2 mmol/L   Chloride 95 (L) 96 - 106 mmol/L   CO2 22 20 - 29 mmol/L   Calcium 9.6 8.6 - 10.2 mg/dL   Total Protein 7.2 6.0 - 8.5 g/dL   Albumin 4.8 3.8 - 4.8 g/dL   Globulin, Total 2.4 1.5 - 4.5 g/dL   Albumin/Globulin Ratio 2.0 1.2 - 2.2   Bilirubin Total 1.0 0.0 - 1.2 mg/dL   Alkaline Phosphatase  61 39 - 117 IU/L   AST 25 0 - 40 IU/L   ALT 27 0 - 44 IU/L  Lipid panel  Result Value Ref Range   Cholesterol, Total 171 100 - 199 mg/dL   Triglycerides 90 0 - 149 mg/dL   HDL 49 >39 mg/dL   VLDL Cholesterol Cal 18 5 - 40 mg/dL   LDL Calculated 104 (H) 0 - 99 mg/dL   Chol/HDL Ratio 3.5 0.0 - 5.0 ratio  CBC  Result Value Ref Range   WBC 7.7 3.4 - 10.8 x10E3/uL   RBC 5.08 4.14 - 5.80 x10E6/uL   Hemoglobin 16.0 13.0 - 17.7 g/dL   Hematocrit 47.5 37.5 - 51.0 %   MCV 94 79 - 97 fL   MCH 31.5 26.6 - 33.0 pg   MCHC 33.7 31.5 - 35.7 g/dL   RDW 12.6 11.6 - 15.4 %   Platelets 157 150 - 450 x10E3/uL  PSA  Result Value Ref Range   Prostate Specific Ag, Serum 0.8 0.0 - 4.0 ng/mL  POCT urinalysis dipstick  Result Value Ref Range   Color, UA yellow yellow   Clarity, UA clear clear  Glucose, UA negative negative mg/dL   Bilirubin, UA negative negative   Ketones, POC UA negative negative mg/dL   Spec Grav, UA <=1.005 (A) 1.010 - 1.025   Blood, UA negative negative   pH, UA 6.0 5.0 - 8.0   Protein Ur, POC negative negative mg/dL   Urobilinogen, UA 0.2 0.2 or 1.0 E.U./dL   Nitrite, UA Negative Negative   Leukocytes, UA Negative Negative       Patient Active Problem List   Diagnosis Date Noted  . HTN (hypertension) 01/18/2012  . Insomnia 01/18/2012  . Tinnitus 01/18/2012  . GERD 05/28/2008  . COLITIS 05/28/2008  . COLONIC POLYPS, ADENOMATOUS, HX OF 05/28/2008   Past Medical History:  Diagnosis Date  . Adenomatous polyp of colon   . GERD (gastroesophageal reflux disease)   . Hypertension   . Tinnitus of both ears    Past Surgical History:  Procedure Laterality Date  . COLONOSCOPY    . TONSILLECTOMY AND ADENOIDECTOMY  1962   Allergies  Allergen Reactions  . Levofloxacin Hives and Rash    Skin got like leather  . Penicillins Rash   Prior to Admission medications   Medication Sig Start Date End Date Taking? Authorizing Provider  aspirin 81 MG tablet Take 81 mg by  mouth daily.     Yes [provider]  Bran 500 MG TABS Take by mouth.   Yes [provider]  famotidine-calcium carbonate-magnesium hydroxide (PEPCID COMPLETE) 10-800-165 MG CHEW chewable tablet Chew 1 tablet by mouth daily as needed.   Yes [provider]  GRAPE SEED ER PO Take by mouth.   Yes [provider]  lisinopril-hydrochlorothiazide (ZESTORETIC) 10-12.5 MG tablet Take 1 tablet by mouth daily. 07/05/19  Yes [name], MD  Multiple Vitamins-Minerals (MULTIVITAMIN WITH MINERALS) tablet Take 1 tablet by mouth daily.     Yes [provider]  multivitamin-lutein (OCUVITE-LUTEIN) CAPS capsule Take 1 capsule by mouth daily.   Yes [provider]  zolpidem (AMBIEN) 5 MG tablet Take 1-1.5 tablets (5-7.5 mg total) by mouth at bedtime as needed for sleep. Patient not taking: Reported on 09/30/2019 07/05/19   [name], MD   Social History   Socioeconomic History  . Marital status: Married    Spouse name: Not on file  . Number of children: 3  . Years of education: Not on file  . Highest education level: Not on file  Occupational History  . Occupation: Scientist, clinical (histocompatibility and immunogenetics): [employer]  . Financial resource strain: Not on file  . Food insecurity    Worry: Not on file    Inability: Not on file  . Transportation needs    Medical: Not on file    Non-medical: Not on file  Tobacco Use  . Smoking status: Never Smoker  . Smokeless tobacco: Never Used  Substance and Sexual Activity  . Alcohol use: Yes    Alcohol/week: 14.0 standard drinks    Types: 14 Glasses of wine per week  . Drug use: No  . Sexual activity: Yes    Birth control/protection: None  Lifestyle  . Physical activity    Days per week: Not on file    Minutes per session: Not on file  . Stress: Not on file  Relationships  . Social Herbalist on phone: Not on file    Gets together: Not on file    Attends religious service: Not on file  Active member of club or organization: Not on file    Attends meetings of clubs or organizations: Not on file    Relationship status: Not on file  . Intimate partner violence    Fear of current or ex partner: Not on file    Emotionally abused: Not on file    Physically abused: Not on file    Forced sexual activity: Not on file  Other Topics Concern  . Not on file  Social History Narrative  . Not on file    Review of Systems Per HPI.     Objective:   Physical Exam Vitals signs reviewed.  Constitutional:      General: He is not in acute distress.    Appearance: He is well-developed.  HENT:     Head: Normocephalic and atraumatic.  Cardiovascular:     Rate and Rhythm: Normal rate.  Pulmonary:     Effort: Pulmonary effort is normal.  Abdominal:     General: Abdomen is flat.     Tenderness: There is no abdominal tenderness. There is no right CVA tenderness or left CVA tenderness.  Musculoskeletal:     Lumbar back: He exhibits normal range of motion (min discomfort with rotation. from. negatve slr, able to heel/toe walk without difficulty. ), no tenderness, no bony tenderness and no spasm.  Neurological:     Mental Status: He is alert and oriented to person, place, and time.     Vitals:   09/30/19 1512  BP: 116/70  Pulse: 77  Temp: 98.6 F (37 C)  TempSrc: Oral  SpO2: 98%  Weight: 220 lb 9.6 oz (100.1 kg)   Results for orders placed or performed in visit on 09/30/19  POCT urinalysis dipstick  Result Value Ref Range   Color, UA yellow yellow   Clarity, UA clear clear   Glucose, UA negative negative mg/dL   Bilirubin, UA negative negative   Ketones, POC UA negative negative mg/dL   Spec Grav, UA 1.020 1.010 - 1.025   Blood, UA negative negative   pH, UA 7.5 5.0 - 8.0   Protein Ur, POC negative negative mg/dL   Urobilinogen, UA 0.2 0.2 or 1.0 E.U./dL   Nitrite, UA Negative Negative   Leukocytes, UA Negative Negative  POCT Microscopic Urinalysis ([facility])  Result  Value Ref Range   WBC,UR,HPF,POC None None WBC/hpf   RBC,UR,HPF,POC None None RBC/hpf   Bacteria None None, Too numerous to count   Mucus Absent Absent   Epithelial Cells, UR Per Microscopy None None, Too numerous to count cells/hpf   Dg Lumbar Spine Complete  Result Date: 09/30/2019 CLINICAL DATA:  Bilateral low back pain without sciatica. EXAM: LUMBAR SPINE - COMPLETE 4+ VIEW COMPARISON:  None. FINDINGS: No fracture or spondylolisthesis is noted. Moderate degenerative disc disease is noted at L5-S1. Anterior osteophyte formation is noted at L1-2, L2-3 and L3-4. Posterior facet joints are unremarkable. IMPRESSION: Multilevel degenerative changes as described above. No acute abnormality seen in the lumbar spine. Electronically Signed   By: Marijo Conception M.D.   On: 09/30/2019 16:34         Assessment & Plan:   Warren Hill is a 63 y.o. male Urinary frequency - Plan: Ambulatory referral to Urology, POCT urinalysis dipstick, POCT Microscopic Urinalysis ([facility]) Nocturia - Plan: Ambulatory referral to Urology  -Reassuring PSA, urinalysis.  Differential includes BPH with LUTS.  Medication options discussed but deferred at this time.  Will refer to urology.  Bilateral low back pain without sciatica,  unspecified chronicity - Plan: POCT urinalysis dipstick, POCT Microscopic Urinalysis ([facility]), Ambulatory referral to Physical Therapy, DG Lumbar Spine Complete  -Suspect mechanical pain, with degenerative changes noted on x-ray.  No acute findings, no red flags on exam.  Initial trial of physical therapy discussed, referral placed.  Symptomatic care discussed with RTC precautions, recheck 6 weeks No orders of the defined types were placed in this encounter.  Patient Instructions    I will refer you to urology, but let me know if you would like to try a medicine in the meantime.   See info on back pain below, I will refer you to physical therapy, but recheck in 6 weeks, Return to the clinic or go  to the nearest emergency room if any of your symptoms worsen or new symptoms occur.      Urinary Frequency, Adult Urinary frequency means urinating more often than usual. You may urinate every 1-2 hours even though you drink a normal amount of fluid and do not have a bladder infection or condition. Although you urinate more often than normal, the total amount of urine produced in a day is normal. With urinary frequency, you may have an urgent need to urinate often. The stress and anxiety of needing to find a bathroom quickly can make this urge worse. This condition may go away on its own or you may need treatment at home. Home treatment may include bladder training, exercises, taking medicines, or making changes to your diet. Follow these instructions at home: Bladder health   Keep a bladder diary if told by your health care provider. Keep track of: ? What you eat and drink. ? How often you urinate. ? How much you urinate.  Follow a bladder training program if told by your health care provider. This may include: ? Learning to delay going to the bathroom. ? Double urinating (voiding). This helps if you are not completely emptying your bladder. ? Scheduled voiding.  Do Kegel exercises as told by your health care provider. Kegel exercises strengthen the muscles that help control urination, which may help the condition. Eating and drinking  If told by your health care provider, make diet changes, such as: ? Avoiding caffeine. ? Drinking fewer fluids, especially alcohol. ? Not drinking in the evening. ? Avoiding foods or drinks that may irritate the bladder. These include coffee, tea, soda, artificial sweeteners, citrus, tomato-based foods, and chocolate. ? Eating foods that help prevent or ease constipation. Constipation can make this condition worse. Your health care provider may recommend that you:  Drink enough fluid to keep your urine pale yellow.  Take over-the-counter or  prescription medicines.  Eat foods that are high in fiber, such as beans, whole grains, and fresh fruits and vegetables.  Limit foods that are high in fat and processed sugars, such as fried or sweet foods. General instructions  Take over-the-counter and prescription medicines only as told by your health care provider.  Keep all follow-up visits as told by your health care provider. This is important. Contact a health care provider if:  You start urinating more often.  You feel pain or irritation when you urinate.  You notice blood in your urine.  Your urine looks cloudy.  You develop a fever.  You begin vomiting. Get help right away if:  You are unable to urinate. Summary  Urinary frequency means urinating more often than usual. With urinary frequency, you may urinate every 1-2 hours even though you drink a normal amount of fluid and  do not have a bladder infection or other bladder condition.  Your health care provider may recommend that you keep a bladder diary, follow a bladder training program, or make dietary changes.  If told by your health care provider, do Kegel exercises to strengthen the muscles that help control urination.  Take over-the-counter and prescription medicines only as told by your health care provider.  Contact a health care provider if your symptoms do not improve or get worse. This information is not intended to replace advice given to you by your health care provider. Make sure you discuss any questions you have with your health care provider. Document Released: 10/01/2009 Document Revised: 06/14/2018 Document Reviewed: 06/14/2018 Elsevier Patient Education  [employer].    If you have lab work done today you will be contacted with your lab results within the next 2 weeks.  If you have not heard from [name] then please contact us. The fastest way to get your results is to register for My Chart.   IF you received an x-ray today, you will  receive an invoice from [facility]. Please contact [facility] at [phone] with questions or concerns regarding your invoice.   IF you received labwork today, you will receive an invoice from [facility]. Please contact [facility] at [phone] with questions or concerns regarding your invoice.   Our billing staff will not be able to assist you with questions regarding bills from these companies.  You will be contacted with the lab results as soon as they are available. The fastest way to get your results is to activate your My Chart account. Instructions are located on the last page of this paperwork. If you have not heard from [name] regarding the results in 2 weeks, please contact this office.      Chronic Back Pain When back pain lasts longer than 3 months, it is called chronic back pain.The cause of your back pain may not be known. Some common causes include:  Wear and tear (degenerative disease) of the bones, ligaments, or disks in your back.  Inflammation and stiffness in your back (arthritis). People who have chronic back pain often go through certain periods in which the pain is more intense (flare-ups). Many people can learn to manage the pain with home care. Follow these instructions at home: Pay attention to any changes in your symptoms. Take these actions to help with your pain: Activity   Avoid bending and other activities that make the problem worse.  Maintain a proper position when standing or sitting: ? When standing, keep your upper back and neck straight, with your shoulders pulled back. Avoid slouching. ? When sitting, keep your back straight and relax your shoulders. Do not round your shoulders or pull them backward.  Do not sit or stand in one place for long periods of time.  Take brief periods of rest throughout the day. This will reduce your pain. Resting in a lying or standing position is usually better than sitting to rest.  When you are  resting for longer periods, mix in some mild activity or stretching between periods of rest. This will help to prevent stiffness and pain.  Get regular exercise. Ask your health care provider what activities are safe for you.  Do not lift anything that is heavier than 10 lb (4.5 kg). Always use proper lifting technique, which includes: ? Bending your knees. ? Keeping the load close to your body. ? Avoiding twisting.  Sleep on a firm mattress in a  comfortable position. Try lying on your side with your knees slightly bent. If you lie on your back, put a pillow under your knees. Managing pain  If directed, apply ice to the painful area. Your health care provider may recommend applying ice during the first 24-48 hours after a flare-up begins. ? Put ice in a plastic bag. ? Place a towel between your skin and the bag. ? Leave the ice on for 20 minutes, 2-3 times per day.  If directed, apply heat to the affected area as often as told by your health care provider. Use the heat source that your health care provider recommends, such as a moist heat pack or a heating pad. ? Place a towel between your skin and the heat source. ? Leave the heat on for 20-30 minutes. ? Remove the heat if your skin turns bright red. This is especially important if you are unable to feel pain, heat, or cold. You may have a greater risk of getting burned.  Try soaking in a warm tub.  Take over-the-counter and prescription medicines only as told by your health care provider.  Keep all follow-up visits as told by your health care provider. This is important. Contact a health care provider if:  You have pain that is not relieved with rest or medicine. Get help right away if:  You have weakness or numbness in one or both of your legs or feet.  You have trouble controlling your bladder or your bowels.  You have nausea or vomiting.  You have pain in your abdomen.  You have shortness of breath or you faint. This  information is not intended to replace advice given to you by your health care provider. Make sure you discuss any questions you have with your health care provider. Document Released: 01/12/2005 Document Revised: 03/28/2019 Document Reviewed: 06/14/2017 Elsevier Patient Education  2020 [place],   Merri Ray, MD [facility].  10/01/19 2:14 PM

## 2019-09-30 NOTE — Patient Instructions (Addendum)
I will refer you to urology, but let me know if you would like to try a medicine in the meantime.   See info on back pain below, I will refer you to physical therapy, but recheck in 6 weeks, Return to the clinic or go to the nearest emergency room if any of your symptoms worsen or new symptoms occur.      Urinary Frequency, Adult Urinary frequency means urinating more often than usual. You may urinate every 1-2 hours even though you drink a normal amount of fluid and do not have a bladder infection or condition. Although you urinate more often than normal, the total amount of urine produced in a day is normal. With urinary frequency, you may have an urgent need to urinate often. The stress and anxiety of needing to find a bathroom quickly can make this urge worse. This condition may go away on its own or you may need treatment at home. Home treatment may include bladder training, exercises, taking medicines, or making changes to your diet. Follow these instructions at home: Bladder health   Keep a bladder diary if told by your health care provider. Keep track of: ? What you eat and drink. ? How often you urinate. ? How much you urinate.  Follow a bladder training program if told by your health care provider. This may include: ? Learning to delay going to the bathroom. ? Double urinating (voiding). This helps if you are not completely emptying your bladder. ? Scheduled voiding.  Do Kegel exercises as told by your health care provider. Kegel exercises strengthen the muscles that help control urination, which may help the condition. Eating and drinking  If told by your health care provider, make diet changes, such as: ? Avoiding caffeine. ? Drinking fewer fluids, especially alcohol. ? Not drinking in the evening. ? Avoiding foods or drinks that may irritate the bladder. These include coffee, tea, soda, artificial sweeteners, citrus, tomato-based foods, and chocolate. ? Eating foods  that help prevent or ease constipation. Constipation can make this condition worse. Your health care provider may recommend that you:  Drink enough fluid to keep your urine pale yellow.  Take over-the-counter or prescription medicines.  Eat foods that are high in fiber, such as beans, whole grains, and fresh fruits and vegetables.  Limit foods that are high in fat and processed sugars, such as fried or sweet foods. General instructions  Take over-the-counter and prescription medicines only as told by your health care provider.  Keep all follow-up visits as told by your health care provider. This is important. Contact a health care provider if:  You start urinating more often.  You feel pain or irritation when you urinate.  You notice blood in your urine.  Your urine looks cloudy.  You develop a fever.  You begin vomiting. Get help right away if:  You are unable to urinate. Summary  Urinary frequency means urinating more often than usual. With urinary frequency, you may urinate every 1-2 hours even though you drink a normal amount of fluid and do not have a bladder infection or other bladder condition.  Your health care provider may recommend that you keep a bladder diary, follow a bladder training program, or make dietary changes.  If told by your health care provider, do Kegel exercises to strengthen the muscles that help control urination.  Take over-the-counter and prescription medicines only as told by your health care provider.  Contact a health care provider if your symptoms do not  improve or get worse. This information is not intended to replace advice given to you by your health care provider. Make sure you discuss any questions you have with your health care provider. Document Released: 10/01/2009 Document Revised: 06/14/2018 Document Reviewed: 06/14/2018 Elsevier Patient Education  [employer].    If you have lab work done today you will be contacted  with your lab results within the next 2 weeks.  If you have not heard from [name] then please contact us. The fastest way to get your results is to register for My Chart.   IF you received an x-ray today, you will receive an invoice from [facility]. Please contact [facility] at [phone] with questions or concerns regarding your invoice.   IF you received labwork today, you will receive an invoice from [facility]. Please contact [facility] at [phone] with questions or concerns regarding your invoice.   Our billing staff will not be able to assist you with questions regarding bills from these companies.  You will be contacted with the lab results as soon as they are available. The fastest way to get your results is to activate your My Chart account. Instructions are located on the last page of this paperwork. If you have not heard from [name] regarding the results in 2 weeks, please contact this office.      Chronic Back Pain When back pain lasts longer than 3 months, it is called chronic back pain.The cause of your back pain may not be known. Some common causes include:  Wear and tear (degenerative disease) of the bones, ligaments, or disks in your back.  Inflammation and stiffness in your back (arthritis). People who have chronic back pain often go through certain periods in which the pain is more intense (flare-ups). Many people can learn to manage the pain with home care. Follow these instructions at home: Pay attention to any changes in your symptoms. Take these actions to help with your pain: Activity   Avoid bending and other activities that make the problem worse.  Maintain a proper position when standing or sitting: ? When standing, keep your upper back and neck straight, with your shoulders pulled back. Avoid slouching. ? When sitting, keep your back straight and relax your shoulders. Do not round your shoulders or pull them backward.  Do not sit or stand in  one place for long periods of time.  Take brief periods of rest throughout the day. This will reduce your pain. Resting in a lying or standing position is usually better than sitting to rest.  When you are resting for longer periods, mix in some mild activity or stretching between periods of rest. This will help to prevent stiffness and pain.  Get regular exercise. Ask your health care provider what activities are safe for you.  Do not lift anything that is heavier than 10 lb (4.5 kg). Always use proper lifting technique, which includes: ? Bending your knees. ? Keeping the load close to your body. ? Avoiding twisting.  Sleep on a firm mattress in a comfortable position. Try lying on your side with your knees slightly bent. If you lie on your back, put a pillow under your knees. Managing pain  If directed, apply ice to the painful area. Your health care provider may recommend applying ice during the first 24-48 hours after a flare-up begins. ? Put ice in a plastic bag. ? Place a towel between your skin and the bag. ? Leave the ice on for 20  minutes, 2-3 times per day.  If directed, apply heat to the affected area as often as told by your health care provider. Use the heat source that your health care provider recommends, such as a moist heat pack or a heating pad. ? Place a towel between your skin and the heat source. ? Leave the heat on for 20-30 minutes. ? Remove the heat if your skin turns bright red. This is especially important if you are unable to feel pain, heat, or cold. You may have a greater risk of getting burned.  Try soaking in a warm tub.  Take over-the-counter and prescription medicines only as told by your health care provider.  Keep all follow-up visits as told by your health care provider. This is important. Contact a health care provider if:  You have pain that is not relieved with rest or medicine. Get help right away if:  You have weakness or numbness in one or  both of your legs or feet.  You have trouble controlling your bladder or your bowels.  You have nausea or vomiting.  You have pain in your abdomen.  You have shortness of breath or you faint. This information is not intended to replace advice given to you by your health care provider. Make sure you discuss any questions you have with your health care provider. Document Released: 01/12/2005 Document Revised: 03/28/2019 Document Reviewed: 06/14/2017 Elsevier Patient Education  2020 [employer].

## 2019-11-27 ENCOUNTER — Other Ambulatory Visit: Payer: Self-pay

## 2019-11-27 ENCOUNTER — Encounter: Payer: Self-pay | Admitting: Family Medicine

## 2019-11-27 DIAGNOSIS — Z20822 Contact with and (suspected) exposure to covid-19: Secondary | ICD-10-CM

## 2019-11-28 LAB — NOVEL CORONAVIRUS, NAA: SARS-CoV-2, NAA: DETECTED — AB

## 2019-11-30 ENCOUNTER — Telehealth: Payer: Self-pay | Admitting: Unknown Physician Specialty

## 2019-11-30 NOTE — Telephone Encounter (Signed)
Discussed with patient about Covid symptoms and the use of bamlanivimab, a monoclonal antibody infusion for those with mild to moderate Covid symptoms and at a high risk of hospitalization.  Pt is qualified for this infusion at the [facility] infusion center due to Age >55 and Hypertension which were addressed with the patient and are actively being managed by a [facility] provider.    Symptoms are improving and declining treatment at this time.

## 2019-12-02 ENCOUNTER — Other Ambulatory Visit: Payer: Self-pay

## 2019-12-02 ENCOUNTER — Telehealth (INDEPENDENT_AMBULATORY_CARE_PROVIDER_SITE_OTHER): Payer: BC Managed Care – PPO | Admitting: Family Medicine

## 2019-12-02 VITALS — Temp 98.0°F | Wt 210.0 lb

## 2019-12-02 DIAGNOSIS — M7989 Other specified soft tissue disorders: Secondary | ICD-10-CM

## 2019-12-02 DIAGNOSIS — U071 COVID-19: Secondary | ICD-10-CM | POA: Diagnosis not present

## 2019-12-02 DIAGNOSIS — Z789 Other specified health status: Secondary | ICD-10-CM

## 2019-12-02 DIAGNOSIS — M79661 Pain in right lower leg: Secondary | ICD-10-CM

## 2019-12-02 MED ORDER — ENOXAPARIN SODIUM 100 MG/ML ~~LOC~~ SOLN: 100.0000 mg | Freq: Two times a day (BID) | SUBCUTANEOUS | 0 refills | Status: DC

## 2019-12-02 NOTE — Progress Notes (Signed)
CC- Covid positive 11/29/19- Headache, eye hurt, 100.2 for 2 days but better.some sweats. Cough, congestion.  Have a sore spot in right calf. Want to have check for blood clot right mid calf. Positive homans sign per son which is an Therapist, sports with [facility]. Feel a knot in calf as well.

## 2019-12-02 NOTE — Progress Notes (Signed)
Virtual Visit via Video Note  I connected with [facility] on 12/02/19 at 5:44 PM by a video enabled telemedicine application and verified that I am speaking with the correct person using two identifiers.   I discussed the limitations, risks, security and privacy concerns of performing an evaluation and management service by telephone and the availability of in person appointments. I also discussed with the patient that there may be a patient responsible charge related to this service. The patient expressed understanding and agreed to proceed, consent obtained  Chief complaint: covid, calf pain  History of Present Illness: Warren Hill is a 63 y.o. male  Positive COVID-19 test December 9.  Initial symptoms 12/6 - fever that night for few days - tmax 100.2. Initially had loss of taste/smell, mild headache, eyes hurt - not bad enough for meds - moisture drops only. No vision changes.  Has had some sweats, min cough, off and on mucus but minimal. No recent decongestant or other meds. . No recent fever. No dyspnea. No confusion, drinking fluids ok.   Primary concern for today's visit is a sore spot in his right calf.  Soreness in both calves past few days. Concerned about a possible DVT/blood clot in his mid calf on R.  Noted R calf knot or a line in R calf today - horizontal line. worse today than prior days. His son is an Therapist, sports, talked him and felt a knot in his calf as well as a reported positive Homans' sign - he performed this test at home after guidance from son and GF who is a Marine scientist in cath lab.  No known swelling of calf, no warmtth Takes 81mg  asa QD.  Drove to [place] and back on 12/6.  Drove to [place] and back on  12/24 and 12/28.  11 hours. 4 breaks.  No new chest pains or dyspnea. No prior hx of DVT.      Patient Active Problem List   Diagnosis Date Noted  . HTN (hypertension) 01/18/2012  . Insomnia 01/18/2012  . Tinnitus 01/18/2012  . GERD 05/28/2008  . COLITIS  05/28/2008  . COLONIC POLYPS, ADENOMATOUS, HX OF 05/28/2008   Past Medical History:  Diagnosis Date  . Adenomatous polyp of colon   . GERD (gastroesophageal reflux disease)   . Hypertension   . Tinnitus of both ears    Past Surgical History:  Procedure Laterality Date  . COLONOSCOPY    . TONSILLECTOMY AND ADENOIDECTOMY  1962   Allergies  Allergen Reactions  . Levofloxacin Hives and Rash    Skin got like leather  . Penicillins Rash   Prior to Admission medications   Medication Sig Start Date End Date Taking? Authorizing Provider  aspirin 81 MG tablet Take 81 mg by mouth daily.     Yes [provider]  Bran 500 MG TABS Take by mouth.   Yes [provider]  famotidine-calcium carbonate-magnesium hydroxide (PEPCID COMPLETE) 10-800-165 MG CHEW chewable tablet Chew 1 tablet by mouth daily as needed.   Yes [provider]  GRAPE SEED ER PO Take by mouth.   Yes [provider]  lisinopril-hydrochlorothiazide (ZESTORETIC) 10-12.5 MG tablet Take 1 tablet by mouth daily. 07/05/19  Yes [name], MD  Multiple Vitamins-Minerals (MULTIVITAMIN WITH MINERALS) tablet Take 1 tablet by mouth daily.     Yes [provider]  multivitamin-lutein (OCUVITE-LUTEIN) CAPS capsule Take 1 capsule by mouth daily.   Yes [provider]  zolpidem (AMBIEN) 5 MG tablet Take  1-1.5 tablets (5-7.5 mg total) by mouth at bedtime as needed for sleep. 07/05/19  Yes [name], MD   Social History   Socioeconomic History  . Marital status: Married    Spouse name: Not on file  . Number of children: 3  . Years of education: Not on file  . Highest education level: Not on file  Occupational History  . Occupation: Scientist, clinical (histocompatibility and immunogenetics): [employer]  Tobacco Use  . Smoking status: Never Smoker  . Smokeless tobacco: Never Used  Substance and Sexual Activity  . Alcohol use: Yes    Alcohol/week: 14.0 standard drinks    Types: 14 Glasses of wine per week  .  Drug use: No  . Sexual activity: Yes    Birth control/protection: None  Other Topics Concern  . Not on file  Social History Narrative  . Not on file   Social Determinants of Health   Financial Resource Strain:   . Difficulty of Paying Living Expenses: Not on file  Food Insecurity:   . Worried About Charity fundraiser in the Last Year: Not on file  . Ran Out of Food in the Last Year: Not on file  Transportation Needs:   . Lack of Transportation (Medical): Not on file  . Lack of Transportation (Non-Medical): Not on file  Physical Activity:   . Days of Exercise per Week: Not on file  . Minutes of Exercise per Session: Not on file  Stress:   . Feeling of Stress : Not on file  Social Connections:   . Frequency of Communication with Friends and Family: Not on file  . Frequency of Social Gatherings with Friends and Family: Not on file  . Attends Religious Services: Not on file  . Active Member of Clubs or Organizations: Not on file  . Attends Archivist Meetings: Not on file  . Marital Status: Not on file  Intimate Partner Violence:   . Fear of Current or Ex-Partner: Not on file  . Emotionally Abused: Not on file  . Physically Abused: Not on file  . Sexually Abused: Not on file    Observations/Objective: Speaking full sentences - no respiratory distress.  Home measurements of calf at 15cm below patella - 17.5 inches on R and 16.5 on left.  Home weight 210 pounds.  Assessment and Plan: Calf swelling - Plan: enoxaparin (LOVENOX) 100 MG/ML injection  Right calf pain - Plan: enoxaparin (LOVENOX) 100 MG/ML injection  History of recent travel - Plan: enoxaparin (LOVENOX) 100 MG/ML injection  COVID-19 virus infection - Plan: enoxaparin (LOVENOX) 100 MG/ML injection  Recent COVID-19 infection with initial symptoms December 6.  Overall symptoms have been stable and improving with the exception of right calf pain past few days, worse today with possible cord based on  reported exam, self testing for homan positive, measurement of calf circumference 1 inch different.  Did have previous long distance car travel and with current covid infection concerning for DVT.  -Start Lovenox 100 mg every 12 hours for now until ultrasound obtained.  Potential bleeding risk discussed, RTC/ER precautions given.  - unable to be scheduled at [facility], [facility].  Vascular lab scheduling: [phone]. Protocol of 14 days after covid positive until testing allowed.  Discussed with patient.  Continue Lovenox 100 mg every 12 hours for now until we are able to obtain ultrasound.  Additional lovenox rx given.  Addendum 12/22Collie Siad to timing requirements for ultrasound, he initially was treated with lovenoz  00mg  BID, then after discussion of bleeding risks with patient, xarelto started on 12/17.  [name] to be ordered after 14days from onset of Covid sx's (or positive test if that is protocol).   Follow Up Instructions: To be determined from ultrasound.   I discussed the assessment and treatment plan with the patient. The patient was provided an opportunity to ask questions and all were answered. The patient agreed with the plan and demonstrated an understanding of the instructions.   The patient was advised to call back or seek an in-person evaluation if the symptoms worsen or if the condition fails to improve as anticipated.  I provided 28 minutes of non-face-to-face time during this encounter.   [name], MD

## 2019-12-02 NOTE — Patient Instructions (Addendum)
Tylenol for headache, continue fluids, rest for COVID-19 infection but expect that to continue to improve. With previous travel, calf swelling/pain and possible deep vein thrombosis, start Lovenox injection tonight, repeat in 12 hours.  I will check into how to obtain ultrasound of the right calf sometime tomorrow and will let you know of those details.   Return to the clinic or go to the nearest emergency room if any of your symptoms worsen or new symptoms occur.  This information is directly available on the CDC website: RunningShows.co.za.html    Source:CDC Reference to specific commercial products, manufacturers, companies, or trademarks does not constitute its endorsement or recommendation by the [place], [place], or Centers for [employer] and Prevention.

## 2019-12-03 ENCOUNTER — Other Ambulatory Visit: Payer: Self-pay | Admitting: Family Medicine

## 2019-12-03 ENCOUNTER — Telehealth: Payer: Self-pay | Admitting: Family Medicine

## 2019-12-03 DIAGNOSIS — M7989 Other specified soft tissue disorders: Secondary | ICD-10-CM

## 2019-12-03 DIAGNOSIS — Z789 Other specified health status: Secondary | ICD-10-CM

## 2019-12-03 DIAGNOSIS — M79661 Pain in right lower leg: Secondary | ICD-10-CM

## 2019-12-03 DIAGNOSIS — U071 COVID-19: Secondary | ICD-10-CM

## 2019-12-03 NOTE — Telephone Encounter (Signed)
Patient is wanted a phone call or update on ultra sound on calf  that Dr.Greene was looking into for him from his appt yesterday . FR

## 2019-12-04 NOTE — Telephone Encounter (Signed)
Please advise I do not see an [name] referral in the referral Query

## 2019-12-05 ENCOUNTER — Telehealth: Payer: Self-pay | Admitting: Family Medicine

## 2019-12-05 DIAGNOSIS — U071 COVID-19: Secondary | ICD-10-CM

## 2019-12-05 DIAGNOSIS — M79661 Pain in right lower leg: Secondary | ICD-10-CM

## 2019-12-05 DIAGNOSIS — M7989 Other specified soft tissue disorders: Secondary | ICD-10-CM

## 2019-12-05 DIAGNOSIS — Z789 Other specified health status: Secondary | ICD-10-CM

## 2019-12-05 MED ORDER — RIVAROXABAN (XARELTO) VTE STARTER PACK (15 & 20 MG): ORAL_TABLET | ORAL | 0 refills | Status: DC

## 2019-12-05 NOTE — Telephone Encounter (Signed)
Please advise 

## 2019-12-05 NOTE — Telephone Encounter (Signed)
Reference pt's refill message from 12/03/19. Pt is requesting a refill on his enoxaparin (LOVENOX) 100 MG/ML injection [id number]. He is positive for covid and can not get an ultrasound. Pharmacy  [id number] - [place], [place] - [address]  [address], [place] [address]  Please advise at [phone].

## 2019-12-05 NOTE — Telephone Encounter (Signed)
Called patient on 1217 at 7:11 PM.  Explained situation that we are still trying to obtain ultrasound but clarifying timing of that ultrasound given COVID-19 positive status.  Option of continue Lovenox but he would like to go ahead and start Xarelto at this time, discussed bleeding risks.  We will go ahead and start Xarelto for treatment of suspected DVT, has stopped aspirin.  Starter pack at 50 mg twice daily for first 3 weeks, then 20 mg daily.  Will still try to get ultrasound scheduled as soon as possible.

## 2019-12-10 ENCOUNTER — Encounter: Payer: Self-pay | Admitting: Family Medicine

## 2019-12-10 NOTE — Telephone Encounter (Addendum)
Order is pending in chart - see imaging. Please call and schedule with vascular lab at [facility] as over 14 days since symptoms (12/6), with positive test on 12/9. If 14 days since test needed, can schedule tomorrow or Thursday. Please let me know if I need to order differently. I will message patient with this plan.

## 2019-12-10 NOTE — Telephone Encounter (Signed)
Pt calling to get the [name] scheduled, as tomorrow will be 15 days since covid positive. Please call back. Pt states he is feeling good!!

## 2019-12-16 ENCOUNTER — Ambulatory Visit
Admission: RE | Admit: 2019-12-16 | Discharge: 2019-12-16 | Disposition: A | Discharge status: Home / Self Care | Payer: BC Managed Care – PPO | Source: Ambulatory Visit | Attending: Family Medicine | Admitting: Family Medicine

## 2019-12-16 DIAGNOSIS — M7989 Other specified soft tissue disorders: Secondary | ICD-10-CM

## 2019-12-16 DIAGNOSIS — Z789 Other specified health status: Secondary | ICD-10-CM

## 2019-12-16 DIAGNOSIS — M79661 Pain in right lower leg: Secondary | ICD-10-CM

## 2019-12-16 DIAGNOSIS — U071 COVID-19: Secondary | ICD-10-CM

## 2019-12-26 ENCOUNTER — Ambulatory Visit: Payer: BC Managed Care – PPO | Attending: Internal Medicine

## 2019-12-26 DIAGNOSIS — Z20822 Contact with and (suspected) exposure to covid-19: Secondary | ICD-10-CM

## 2019-12-28 LAB — NOVEL CORONAVIRUS, NAA: SARS-CoV-2, NAA: NOT DETECTED

## 2020-03-06 ENCOUNTER — Encounter (INDEPENDENT_AMBULATORY_CARE_PROVIDER_SITE_OTHER): Payer: Self-pay | Admitting: Otolaryngology

## 2020-03-06 ENCOUNTER — Ambulatory Visit (INDEPENDENT_AMBULATORY_CARE_PROVIDER_SITE_OTHER): Payer: BC Managed Care – PPO | Admitting: Otolaryngology

## 2020-03-06 ENCOUNTER — Other Ambulatory Visit: Payer: Self-pay

## 2020-03-06 VITALS — Temp 97.5°F

## 2020-03-06 DIAGNOSIS — H6123 Impacted cerumen, bilateral: Secondary | ICD-10-CM

## 2020-03-06 DIAGNOSIS — H903 Sensorineural hearing loss, bilateral: Secondary | ICD-10-CM | POA: Diagnosis not present

## 2020-03-06 NOTE — Progress Notes (Signed)
HPI: [facility] is a 64 y.o. male who presents for evaluation of cleaning his hears.  He wears bilateral hearing aids and is looking to get new hearing aids and the audiologist recommended having his ears cleaned first..  Past Medical History:  Diagnosis Date  . Adenomatous polyp of colon   . GERD (gastroesophageal reflux disease)   . Hypertension   . Tinnitus of both ears    Past Surgical History:  Procedure Laterality Date  . COLONOSCOPY    . TONSILLECTOMY AND ADENOIDECTOMY  1962   Social History   Socioeconomic History  . Marital status: Married    Spouse name: Not on file  . Number of children: 3  . Years of education: Not on file  . Highest education level: Not on file  Occupational History  . Occupation: Scientist, clinical (histocompatibility and immunogenetics): [employer]  Tobacco Use  . Smoking status: Never Smoker  . Smokeless tobacco: Never Used  Substance and Sexual Activity  . Alcohol use: Yes    Alcohol/week: 14.0 standard drinks    Types: 14 Glasses of wine per week  . Drug use: No  . Sexual activity: Yes    Birth control/protection: None  Other Topics Concern  . Not on file  Social History Narrative  . Not on file   Social Determinants of Health   Financial Resource Strain:   . Difficulty of Paying Living Expenses:   Food Insecurity:   . Worried About Charity fundraiser in the Last Year:   . Arboriculturist in the Last Year:   Transportation Needs:   . Film/video editor (Medical):   Marland Kitchen Lack of Transportation (Non-Medical):   Physical Activity:   . Days of Exercise per Week:   . Minutes of Exercise per Session:   Stress:   . Feeling of Stress :   Social Connections:   . Frequency of Communication with Friends and Family:   . Frequency of Social Gatherings with Friends and Family:   . Attends Religious Services:   . Active Member of Clubs or Organizations:   . Attends Archivist Meetings:   Marland Kitchen Marital Status:    Family History  Problem Relation Age of Onset  .  Colon polyps Mother   . Hypertension Mother   . Colon polyps Father   . Hypertension Father   . Pancreatic cancer Paternal Uncle   . Liver disease Paternal Uncle   . Colon cancer Maternal Grandmother   . Colon polyps Brother   . Diabetes Paternal Grandmother    Allergies  Allergen Reactions  . Levofloxacin Hives and Rash    Skin got like leather  . Penicillins Rash   Prior to Admission medications   Medication Sig Start Date End Date Taking? Authorizing Provider  Bran 500 MG TABS Take by mouth.    [provider]  enoxaparin (LOVENOX) 100 MG/ML injection Inject 1 mL (100 mg total) into the skin every 12 (twelve) hours. 12/02/19   [name], MD  famotidine-calcium carbonate-magnesium hydroxide (PEPCID COMPLETE) 10-800-165 MG CHEW chewable tablet Chew 1 tablet by mouth daily as needed.    [provider]  GRAPE SEED ER PO Take by mouth.    [provider]  lisinopril-hydrochlorothiazide (ZESTORETIC) 10-12.5 MG tablet Take 1 tablet by mouth daily. 07/05/19   [name], MD  Multiple Vitamins-Minerals (MULTIVITAMIN WITH MINERALS) tablet Take 1 tablet by mouth daily.      [provider]  multivitamin-lutein (  OCUVITE-LUTEIN) CAPS capsule Take 1 capsule by mouth daily.    [provider]  Rivaroxaban 15 & 20 MG TBPK Follow package directions: Take one 15mg  tablet by mouth twice a day. On day 22, switch to one 20mg  tablet once a day. Take with food. 12/05/19   [name], MD  zolpidem (AMBIEN) 5 MG tablet Take 1-1.5 tablets (5-7.5 mg total) by mouth at bedtime as needed for sleep. 07/05/19   [name], MD     Positive ROS: Otherwise negative  All other systems have been reviewed and were otherwise negative with the exception of those mentioned in the HPI and as above.  Physical Exam: Constitutional: Alert, well-appearing, no acute distress Ears: External ears without lesions or tenderness. Ear canals moderate amount  of wax in both ear canals that was partially occluding the hearing aids.  This was cleaned in the office with curettes and forceps.  TMs were otherwise clear. Nasal: External nose without lesions. Clear nasal passages Oral: Oropharynx clear. Neck: No palpable adenopathy or masses Respiratory: Breathing comfortably  Skin: No facial/neck lesions or rash noted.  Cerumen impaction removal  Date/Time: 03/06/2020 1:15 PM Performed by: Rozetta Nunnery, MD Authorized by: Rozetta Nunnery, MD   Consent:    Consent obtained:  Verbal   Consent given by:  Patient   Risks discussed:  Pain and bleeding Procedure details:    Location:  L ear and R ear   Procedure type: curette and forceps   Post-procedure details:    Inspection:  TM intact and canal normal   Hearing quality:  Improved   Patient tolerance of procedure:  Tolerated well, no immediate complications Comments:     TMs clear bilaterally    Assessment: Cerumen impaction  Plan: This was cleaned in the office.  He will follow-up as needed  Radene Journey, MD

## 2020-04-06 ENCOUNTER — Other Ambulatory Visit: Payer: Self-pay | Admitting: Family Medicine

## 2020-04-06 DIAGNOSIS — I10 Essential (primary) hypertension: Secondary | ICD-10-CM

## 2020-04-22 ENCOUNTER — Ambulatory Visit (INDEPENDENT_AMBULATORY_CARE_PROVIDER_SITE_OTHER): Payer: BC Managed Care – PPO | Admitting: Family Medicine

## 2020-04-22 ENCOUNTER — Other Ambulatory Visit: Payer: Self-pay

## 2020-04-22 ENCOUNTER — Encounter: Payer: Self-pay | Admitting: Family Medicine

## 2020-04-22 VITALS — BP 121/77 | HR 80 | Temp 98.3°F | Ht 71.0 in | Wt 232.0 lb

## 2020-04-22 DIAGNOSIS — M79661 Pain in right lower leg: Secondary | ICD-10-CM

## 2020-04-22 DIAGNOSIS — Z8616 Personal history of COVID-19: Secondary | ICD-10-CM | POA: Diagnosis not present

## 2020-04-22 DIAGNOSIS — M7989 Other specified soft tissue disorders: Secondary | ICD-10-CM | POA: Diagnosis not present

## 2020-04-22 NOTE — Progress Notes (Signed)
Subjective:  Patient ID: Warren Hill, male    DOB: October 10, 1956  Age: 64 y.o. MRN: [id number]  CC:  Chief Complaint  Patient presents with  . Edema    Pt states his R leg. is swollen he states he thought it was blood clots he was put on blood thiners and it helped for a period of time. pt was un abel to get an ultrasoud due to pt having covid in the past. the swelling has returned pt reports no pain no numbness or tingling. pt states him and his family are un able to find a lump in that leg. no known trama to pt's R leg no known trigers for the swelling.    HPI Warren Hill presents for   Right leg swelling: See prior notes, telemedicine in December with calf swelling, recent Covid infection at that time.  Initially was temporarily treated with lovenox, then xarelto anticoagulation until ultrasound could be performed.  Ultimately had a negative ultrasound of the right lower extremity for DVT on 12/16/2019. May have had some persistent asymmetric R leg since - not sure. covid infection in 11/2019.   Noticed increased swelling in calf 3 days ago. Had been working outside that day. Coffee that am, but 36 oz of water during the day, 2 beers that night. Noticed swelling that night when went to bed. Slight improvement Monday am, then more swollen past 2 days.  No CP/dyspnea. Slight soreness in calf on back side. No lumps. Wears compression socks with flying or driving - last flight [place] month ago.  Frequent long distance driving, stops every 2 hours to get out.  History Patient Active Problem List   Diagnosis Date Noted  . HTN (hypertension) 01/18/2012  . Insomnia 01/18/2012  . Tinnitus 01/18/2012  . GERD 05/28/2008  . COLITIS 05/28/2008  . COLONIC POLYPS, ADENOMATOUS, HX OF 05/28/2008   Past Medical History:  Diagnosis Date  . Adenomatous polyp of colon   . GERD (gastroesophageal reflux disease)   . Hypertension   . Tinnitus of both ears    Past Surgical History:  Procedure  Laterality Date  . COLONOSCOPY    . TONSILLECTOMY AND ADENOIDECTOMY  1962   Allergies  Allergen Reactions  . Levofloxacin Hives and Rash    Skin got like leather  . Penicillins Rash   Prior to Admission medications   Medication Sig Start Date End Date Taking? Authorizing Provider  Bran 500 MG TABS Take by mouth.    [provider]  enoxaparin (LOVENOX) 100 MG/ML injection Inject 1 mL (100 mg total) into the skin every 12 (twelve) hours. 12/02/19   [name], MD  famotidine-calcium carbonate-magnesium hydroxide (PEPCID COMPLETE) 10-800-165 MG CHEW chewable tablet Chew 1 tablet by mouth daily as needed.    [provider]  GRAPE SEED ER PO Take by mouth.    [provider]  lisinopril-hydrochlorothiazide (ZESTORETIC) 10-12.5 MG tablet TAKE 1 TABLET BY MOUTH EVERY DAY 04/06/20   [name], MD  Multiple Vitamins-Minerals (MULTIVITAMIN WITH MINERALS) tablet Take 1 tablet by mouth daily.      [provider]  multivitamin-lutein (OCUVITE-LUTEIN) CAPS capsule Take 1 capsule by mouth daily.    [provider]  Rivaroxaban 15 & 20 MG TBPK Follow package directions: Take one 15mg  tablet by mouth twice a day. On day 22, switch to one 20mg  tablet once a day. Take with food. Patient not taking: Reported on 04/22/2020 12/05/19   [name], MD  zolpidem (  AMBIEN) 5 MG tablet Take 1-1.5 tablets (5-7.5 mg total) by mouth at bedtime as needed for sleep. Patient not taking: Reported on 04/22/2020 07/05/19   [name], MD   Social History   Socioeconomic History  . Marital status: Married    Spouse name: Not on file  . Number of children: 3  . Years of education: Not on file  . Highest education level: Not on file  Occupational History  . Occupation: Scientist, clinical (histocompatibility and immunogenetics): [employer]  Tobacco Use  . Smoking status: Never Smoker  . Smokeless tobacco: Never Used  Substance and Sexual Activity  . Alcohol use: Yes    Alcohol/week: 14.0  standard drinks    Types: 14 Glasses of wine per week  . Drug use: No  . Sexual activity: Yes    Birth control/protection: None  Other Topics Concern  . Not on file  Social History Narrative  . Not on file   Social Determinants of Health   Financial Resource Strain:   . Difficulty of Paying Living Expenses:   Food Insecurity:   . Worried About Charity fundraiser in the Last Year:   . Arboriculturist in the Last Year:   Transportation Needs:   . Film/video editor (Medical):   Marland Kitchen Lack of Transportation (Non-Medical):   Physical Activity:   . Days of Exercise per Week:   . Minutes of Exercise per Session:   Stress:   . Feeling of Stress :   Social Connections:   . Frequency of Communication with Friends and Family:   . Frequency of Social Gatherings with Friends and Family:   . Attends Religious Services:   . Active Member of Clubs or Organizations:   . Attends Archivist Meetings:   Marland Kitchen Marital Status:   Intimate Partner Violence:   . Fear of Current or Ex-Partner:   . Emotionally Abused:   Marland Kitchen Physically Abused:   . Sexually Abused:     Review of Systems   Objective:   Vitals:   04/22/20 1041  BP: 121/77  Pulse: 80  Temp: 98.3 F (36.8 C)  TempSrc: Temporal  SpO2: 99%  Weight: 232 lb (105.2 kg)  Height: 5\' 11"  (1.803 m)     Physical Exam Constitutional:      General: He is not in acute distress.    Appearance: He is well-developed.  HENT:     Head: Normocephalic and atraumatic.  Cardiovascular:     Rate and Rhythm: Normal rate.  Pulmonary:     Effort: Pulmonary effort is normal.  Musculoskeletal:        General: Swelling (R calf 44cm vs 40cm on left at 20cm below patella) present.     Right lower leg: Edema present.     Comments: Negative homans, nontender. Calf swelling on R.   Neurological:     Mental Status: He is alert and oriented to person, place, and time.     Assessment & Plan:  Warren Hill is a 64 y.o. male . Calf  swelling - Plan: VAS [name] LOWER EXTREMITY VENOUS (DVT)  History of COVID-19 - Plan: VAS [name] LOWER EXTREMITY VENOUS (DVT)  Right calf pain - Plan: VAS [name] LOWER EXTREMITY VENOUS (DVT)  -Prior calf pain/swelling in December, ultimately ultrasound was negative at that time.  He does have risk factor for clotting with history of Covid, long distance travel but has been wearing compression stockings as well as frequent ambulation during travel.  New onset right calf swelling past few days, initially more calf pain, pain has improved.  Discrepancy as above.  Check ultrasound.  If negative consider vascular specialist eval for persistent asymmetry of right versus left calf.  ER precautions given.    No orders of the defined types were placed in this encounter.  Patient Instructions       If you have lab work done today you will be contacted with your lab results within the next 2 weeks.  If you have not heard from [name] then please contact us. The fastest way to get your results is to register for My Chart.   IF you received an x-ray today, you will receive an invoice from [facility]. Please contact [facility] at [phone] with questions or concerns regarding your invoice.   IF you received labwork today, you will receive an invoice from [facility]. Please contact [facility] at [phone] with questions or concerns regarding your invoice.   Our billing staff will not be able to assist you with questions regarding bills from these companies.  You will be contacted with the lab results as soon as they are available. The fastest way to get your results is to activate your My Chart account. Instructions are located on the last page of this paperwork. If you have not heard from [name] regarding the results in 2 weeks, please contact this office.         Signed, Merri Ray, MD [facility]

## 2020-04-22 NOTE — Patient Instructions (Signed)
° ° ° °  If you have lab work done today you will be contacted with your lab results within the next 2 weeks.  If you have not heard from us then please contact us. The fastest way to get your results is to register for My Chart. ° ° °IF you received an x-ray today, you will receive an invoice from [facility]. Please contact [facility] at [phone] with questions or concerns regarding your invoice.  ° °IF you received labwork today, you will receive an invoice from [facility]. Please contact [facility] at [phone] with questions or concerns regarding your invoice.  ° °Our billing staff will not be able to assist you with questions regarding bills from these companies. ° °You will be contacted with the lab results as soon as they are available. The fastest way to get your results is to activate your My Chart account. Instructions are located on the last page of this paperwork. If you have not heard from us regarding the results in 2 weeks, please contact this office. °  ° ° ° °

## 2020-04-23 ENCOUNTER — Other Ambulatory Visit: Payer: Self-pay

## 2020-04-23 ENCOUNTER — Telehealth: Payer: Self-pay

## 2020-04-23 ENCOUNTER — Ambulatory Visit (HOSPITAL_COMMUNITY)
Admission: RE | Admit: 2020-04-23 | Discharge: 2020-04-23 | Disposition: A | Discharge status: Home / Self Care | Payer: BC Managed Care – PPO | Source: Ambulatory Visit | Attending: Family Medicine | Admitting: Family Medicine

## 2020-04-23 DIAGNOSIS — M7989 Other specified soft tissue disorders: Secondary | ICD-10-CM

## 2020-04-23 DIAGNOSIS — Z8616 Personal history of COVID-19: Secondary | ICD-10-CM | POA: Diagnosis present

## 2020-04-23 DIAGNOSIS — M79661 Pain in right lower leg: Secondary | ICD-10-CM | POA: Diagnosis present

## 2020-04-23 NOTE — Progress Notes (Signed)
Right lower extremity venous duplex has been completed. Preliminary results can be found in CV Proc through chart review.  Results were given to [facility] at Dr. Vonna Kotyk office.  04/23/20 11:28 AM Carlos Levering RVT

## 2020-04-27 NOTE — Telephone Encounter (Signed)
No ation required at this time, closing encounter from inbox 

## 2020-06-03 ENCOUNTER — Other Ambulatory Visit: Payer: Self-pay

## 2020-06-03 ENCOUNTER — Telehealth (INDEPENDENT_AMBULATORY_CARE_PROVIDER_SITE_OTHER): Payer: BC Managed Care – PPO | Admitting: Family Medicine

## 2020-06-03 DIAGNOSIS — M79661 Pain in right lower leg: Secondary | ICD-10-CM | POA: Diagnosis not present

## 2020-06-03 DIAGNOSIS — M7989 Other specified soft tissue disorders: Secondary | ICD-10-CM

## 2020-06-03 DIAGNOSIS — S20361A Insect bite (nonvenomous) of right front wall of thorax, initial encounter: Secondary | ICD-10-CM | POA: Diagnosis not present

## 2020-06-03 DIAGNOSIS — Z789 Other specified health status: Secondary | ICD-10-CM

## 2020-06-03 DIAGNOSIS — W57XXXA Bitten or stung by nonvenomous insect and other nonvenomous arthropods, initial encounter: Secondary | ICD-10-CM

## 2020-06-03 DIAGNOSIS — M79662 Pain in left lower leg: Secondary | ICD-10-CM

## 2020-06-03 MED ORDER — DOXYCYCLINE HYCLATE 100 MG PO TABS: 200.0000 mg | ORAL_TABLET | Freq: Once | ORAL | 0 refills | Status: AC

## 2020-06-03 NOTE — Patient Instructions (Addendum)
Doxycycline 2 pills once for recent tick bite.  If any increased redness, swelling, fevers, headache, rash, or other new or worsening symptoms be seen right away.  Lab only visit tomorrow, then if D-dimer is elevated I can order an ultrasound.  I will also check electrolytes as well as muscle enzyme test.  Keep follow-up as planned with the vascular specialist, but if any concerns on labs tomorrow or any worsening symptoms we can certainly do some other testing sooner.  Thanks again for taking my call today  If you have lab work done today you will be contacted with your lab results within the next 2 weeks.  If you have not heard from [name] then please contact us. The fastest way to get your results is to register for My Chart.   IF you received an x-ray today, you will receive an invoice from [facility]. Please contact [facility] at [phone] with questions or concerns regarding your invoice.   IF you received labwork today, you will receive an invoice from [facility]. Please contact [facility] at [phone] with questions or concerns regarding your invoice.   Our billing staff will not be able to assist you with questions regarding bills from these companies.  You will be contacted with the lab results as soon as they are available. The fastest way to get your results is to activate your My Chart account. Instructions are located on the last page of this paperwork. If you have not heard from [name] regarding the results in 2 weeks, please contact this office.

## 2020-06-03 NOTE — Progress Notes (Signed)
Virtual Visit via Audio Note  I connected with Warren Hill on 06/03/20 at 7:01 PM by audio enabled telemedicine application and verified that I am speaking with the correct person using two identifiers.   I discussed the limitations, risks, security and privacy concerns of performing an evaluation and management service by telephone and the availability of in person appointments. I also discussed with the patient that there may be a patient responsible charge related to this service. The patient expressed understanding and agreed to proceed, consent obtained  Chief complaint:  Leg swelling.    History of Present Illness: Warren Hill is a 64 y.o. male  Right greater than left leg pain, swelling.  See previous visits.  Covid in December 2020, treated presumptively for possible DVT at that time due to difficulty obtaining ultrasound, but ultimately had a negative lower extremity ultrasound for DVT on 12/16/2019.  Had reported some persistent asymmetry at May 5 visit.  Increased right calf swelling 3 days prior but had been working outside that day.  Does have a history of frequent long distance driving but had stopped every few hours to get out and walk.  Ultrasound was negative for DVT on May 6.  Plan for follow-up with the vein and vascular specialist for recurrent lower extremity swelling, appointment is scheduled July 9.  Currently: Feels more soreness in legs, ankle to knee pain - pain in calves and feels like shin splints in front. R greater than left.Did not feel pain as much in past, but now sore in both calves - notes at rest at times.  R lower leg has remained an inch larger than left, (18cm and 17cm per his measurement). Both slightly less swollen today and less sore today. Some improvement overnight in swelling.  Ibuprofen at dinner time to treat - helps with soreness.  Worked outside one weekend, but no other new activities or exercises.  No redness/rash.  No numbness in toes or  color changes.  No other persistent muscle soreness.  Still traveling for work, driving and flying at times. Wears compression socks. Will take breaks with driving. No CP/dyspnea.   In woods on Sunday of last week. Noticed tick on R chest area. Did not notice prior 3 days. Flat, but larger tick. Removed 1 week ago.  Red spot in area of tick head only. Area. about 1/8inch. , no target lesion. persistent red dot in area.  No fever/ha/rash/flu like symptoms.     Patient Active Problem List   Diagnosis Date Noted  . HTN (hypertension) 01/18/2012  . Insomnia 01/18/2012  . Tinnitus 01/18/2012  . GERD 05/28/2008  . COLITIS 05/28/2008  . COLONIC POLYPS, ADENOMATOUS, HX OF 05/28/2008   Past Medical History:  Diagnosis Date  . Adenomatous polyp of colon   . GERD (gastroesophageal reflux disease)   . Hypertension   . Tinnitus of both ears    Past Surgical History:  Procedure Laterality Date  . COLONOSCOPY    . TONSILLECTOMY AND ADENOIDECTOMY  1962   Allergies  Allergen Reactions  . Other Hives and Rash  . Levofloxacin Hives and Rash    Skin got like leather  . Penicillins Rash   Prior to Admission medications   Medication Sig Start Date End Date Taking? Authorizing Provider  Bran 500 MG TABS Take by mouth.   Yes [provider]  enoxaparin (LOVENOX) 100 MG/ML injection Inject 1 mL (100 mg total) into the skin every 12 (twelve) hours. 12/02/19  Yes [name], MD  famotidine-calcium carbonate-magnesium hydroxide (PEPCID COMPLETE) 10-800-165 MG CHEW chewable tablet Chew 1 tablet by mouth daily as needed.   Yes [provider]  GRAPE SEED ER PO Take by mouth.   Yes [provider]  ibuprofen (ADVIL) 200 MG tablet Take 200 mg by mouth every 6 (six) hours as needed.   Yes [provider]  lisinopril-hydrochlorothiazide (ZESTORETIC) 10-12.5 MG tablet TAKE 1 TABLET BY MOUTH EVERY DAY 04/06/20  Yes [name], MD  Multiple Vitamins-Minerals  (MULTIVITAMIN WITH MINERALS) tablet Take 1 tablet by mouth daily.     Yes [provider]  multivitamin-lutein (OCUVITE-LUTEIN) CAPS capsule Take 1 capsule by mouth daily.   Yes [provider]  Rivaroxaban 15 & 20 MG TBPK Follow package directions: Take one 15mg  tablet by mouth twice a day. On day 22, switch to one 20mg  tablet once a day. Take with food. 12/05/19  Yes [name], MD  zolpidem (AMBIEN) 5 MG tablet Take 1-1.5 tablets (5-7.5 mg total) by mouth at bedtime as needed for sleep. 07/05/19  Yes [name], MD   Social History   Socioeconomic History  . Marital status: Married    Spouse name: Not on file  . Number of children: 3  . Years of education: Not on file  . Highest education level: Not on file  Occupational History  . Occupation: Scientist, clinical (histocompatibility and immunogenetics): [employer]  Tobacco Use  . Smoking status: Never Smoker  . Smokeless tobacco: Never Used  Vaping Use  . Vaping Use: Never used  Substance and Sexual Activity  . Alcohol use: Yes    Alcohol/week: 14.0 standard drinks    Types: 14 Glasses of wine per week  . Drug use: No  . Sexual activity: Yes    Birth control/protection: None  Other Topics Concern  . Not on file  Social History Narrative  . Not on file   Social Determinants of Health   Financial Resource Strain:   . Difficulty of Paying Living Expenses:   Food Insecurity:   . Worried About Charity fundraiser in the Last Year:   . Arboriculturist in the Last Year:   Transportation Needs:   . Film/video editor (Medical):   Marland Kitchen Lack of Transportation (Non-Medical):   Physical Activity:   . Days of Exercise per Week:   . Minutes of Exercise per Session:   Stress:   . Feeling of Stress :   Social Connections:   . Frequency of Communication with Friends and Family:   . Frequency of Social Gatherings with Friends and Family:   . Attends Religious Services:   . Active Member of Clubs or Organizations:   . Attends Theatre manager Meetings:   Marland Kitchen Marital Status:   Intimate Partner Violence:   . Fear of Current or Ex-Partner:   . Emotionally Abused:   Marland Kitchen Physically Abused:   . Sexually Abused:     Observations/Objective: There were no vitals filed for this visit. No distress on phone, appropriate sponsors, all questions answered, understanding expressed.  Assessment and Plan: Bilateral calf pain - Plan: D-dimer, quantitative (not at [facility]), CK, Basic metabolic panel History of recent travel - Plan: D-dimer, quantitative (not at [facility]), CK, Basic metabolic panel Calf swelling - Plan: D-dimer, quantitative (not at [facility]), CK, Basic metabolic panel  -Right greater than left calf pain, intermittent swelling with some improvement at night.  Previously did not experience much in the way of pain,  just swelling.  Will check CPK, electrolytes tomorrow at lab only visit along with D-dimer.  If elevated check ultrasound.  Has follow-up scheduled with vascular, ER/RTC precautions sooner if worsening.  Further testing per labs tomorrow.  Tick bite of right side of chest wall, initial encounter - Plan: doxycycline (VIBRA-TABS) 100 MG tablet  -Tick attachment potentially greater than 3 days.  No systemic symptoms.  Persistent red area in area of prior tick only.  doxycycline 200 mg x 1 for Lyme prophylaxis discussed but RTC precautions given if any new rash, spreading redness, or other systemic symptoms.  Follow Up Instructions: Lab visit tomorrow.   I discussed the assessment and treatment plan with the patient. The patient was provided an opportunity to ask questions and all were answered. The patient agreed with the plan and demonstrated an understanding of the instructions.   The patient was advised to call back or seek an in-person evaluation if the symptoms worsen or if the condition fails to improve as anticipated.  I provided 21 minutes of non-face-to-face time during this encounter.   [name], MD

## 2020-06-04 ENCOUNTER — Telehealth: Payer: Self-pay | Admitting: Family Medicine

## 2020-06-04 NOTE — Telephone Encounter (Signed)
06/04/2020 - PATIENT HAD A MY-CHART APPOINTMENT WITH DR. Carlota Raspberry ON Wednesday (06/03/2020). DR. Carlota Raspberry HAS REQUESTED PATIENT RETURN TODAY (Thursday) 06/04/2020 TO HAVE LABS DRAWN. THE ORDERS ARE IN. I TRIED TO CALL AND SCHEDULE BUT HAD TO LEAVE A MESSAGE ON HIS VOICE MAIL TO RETURN MY CALL. Warren Hill

## 2020-06-05 ENCOUNTER — Other Ambulatory Visit: Payer: Self-pay

## 2020-06-05 ENCOUNTER — Ambulatory Visit (INDEPENDENT_AMBULATORY_CARE_PROVIDER_SITE_OTHER): Payer: BC Managed Care – PPO | Admitting: Registered Nurse

## 2020-06-05 DIAGNOSIS — M79661 Pain in right lower leg: Secondary | ICD-10-CM | POA: Diagnosis not present

## 2020-06-05 DIAGNOSIS — M79662 Pain in left lower leg: Secondary | ICD-10-CM | POA: Diagnosis not present

## 2020-06-05 DIAGNOSIS — M7989 Other specified soft tissue disorders: Secondary | ICD-10-CM

## 2020-06-05 DIAGNOSIS — Z789 Other specified health status: Secondary | ICD-10-CM

## 2020-06-05 NOTE — Patient Instructions (Signed)
° ° ° °  If you have lab work done today you will be contacted with your lab results within the next 2 weeks.  If you have not heard from us then please contact us. The fastest way to get your results is to register for My Chart. ° ° °IF you received an x-ray today, you will receive an invoice from [facility]. Please contact [facility] at [phone] with questions or concerns regarding your invoice.  ° °IF you received labwork today, you will receive an invoice from [facility]. Please contact [facility] at [phone] with questions or concerns regarding your invoice.  ° °Our billing staff will not be able to assist you with questions regarding bills from these companies. ° °You will be contacted with the lab results as soon as they are available. The fastest way to get your results is to activate your My Chart account. Instructions are located on the last page of this paperwork. If you have not heard from us regarding the results in 2 weeks, please contact this office. °  ° ° ° °

## 2020-06-06 LAB — D-DIMER, QUANTITATIVE: D-DIMER: 0.21 mg/L FEU (ref 0.00–0.49)

## 2020-06-06 LAB — BASIC METABOLIC PANEL
BUN/Creatinine Ratio: 19 (ref 10–24)
BUN: 18 mg/dL (ref 8–27)
CO2: 23 mmol/L (ref 20–29)
Calcium: 9.4 mg/dL (ref 8.6–10.2)
Chloride: 103 mmol/L (ref 96–106)
Creatinine, Ser: 0.94 mg/dL (ref 0.76–1.27)
GFR calc Af Amer: 99 mL/min/{1.73_m2} (ref >59)
GFR calc non Af Amer: 85 mL/min/{1.73_m2} (ref >59)
Glucose: 98 mg/dL (ref 65–99)
Potassium: 4.6 mmol/L (ref 3.5–5.2)
Sodium: 142 mmol/L (ref 134–144)

## 2020-06-06 LAB — CK: Total CK: 115 U/L (ref 41–331)

## 2020-06-15 ENCOUNTER — Other Ambulatory Visit: Payer: Self-pay | Admitting: *Deleted

## 2020-06-15 DIAGNOSIS — M7989 Other specified soft tissue disorders: Secondary | ICD-10-CM

## 2020-06-26 ENCOUNTER — Other Ambulatory Visit: Payer: Self-pay

## 2020-06-26 ENCOUNTER — Encounter: Payer: Self-pay | Admitting: Vascular Surgery

## 2020-06-26 ENCOUNTER — Ambulatory Visit: Payer: BC Managed Care – PPO | Admitting: Vascular Surgery

## 2020-06-26 ENCOUNTER — Ambulatory Visit (HOSPITAL_COMMUNITY)
Admission: RE | Admit: 2020-06-26 | Discharge: 2020-06-26 | Disposition: A | Discharge status: Home / Self Care | Payer: BC Managed Care – PPO | Source: Ambulatory Visit | Attending: Vascular Surgery | Admitting: Vascular Surgery

## 2020-06-26 VITALS — BP 136/87 | HR 64 | Temp 98.0°F | Resp 20 | Ht 71.0 in | Wt 246.0 lb

## 2020-06-26 DIAGNOSIS — M25562 Pain in left knee: Secondary | ICD-10-CM | POA: Diagnosis not present

## 2020-06-26 DIAGNOSIS — M25561 Pain in right knee: Secondary | ICD-10-CM

## 2020-06-26 DIAGNOSIS — M7989 Other specified soft tissue disorders: Secondary | ICD-10-CM | POA: Insufficient documentation

## 2020-06-26 NOTE — Progress Notes (Signed)
Patient ID: Warren Hill, male   DOB: August 14, 1956, 64 y.o.   MRN: [id number]  Reason for Consult: New Patient (Initial Visit)   Referred by [name], MD  Subjective:     HPI:  Warren Hill is a 64 y.o. male history of bilateral lower extremity pain.  Began on the right side now also affects the left.  States that it is really anterior leg pain occasionally in the calf as well.  It is positional especially when he is sleeping.  Sometimes improved with walking.  He has been evaluated in the past by podiatry and also by orthopedics states that he does not have arthritis.  Really states that it feels like shinsplints.  He does not have any tissue loss or ulceration.  He does not have any previous vascular history.  Denies any history of DVT has been worked up for DVT since his Covid diagnosis in December.  Risk factors for vascular disease include hypertension.  Past Medical History:  Diagnosis Date   Adenomatous polyp of colon    GERD (gastroesophageal reflux disease)    Hypertension    Tinnitus of both ears    Family History  Problem Relation Age of Onset   Colon polyps Mother    Hypertension Mother    Colon polyps Father    Hypertension Father    Pancreatic cancer Paternal Uncle    Liver disease Paternal Uncle    Colon cancer Maternal Grandmother    Colon polyps Brother    Diabetes Paternal Grandmother    Past Surgical History:  Procedure Laterality Date   COLONOSCOPY     TONSILLECTOMY AND ADENOIDECTOMY  1962    Short Social History:  Social History   Tobacco Use   Smoking status: Never Smoker   Smokeless tobacco: Never Used  Substance Use Topics   Alcohol use: Yes    Alcohol/week: 14.0 standard drinks    Types: 14 Glasses of wine per week    Allergies  Allergen Reactions   Other Hives and Rash   Levofloxacin Hives and Rash    Skin got like leather   Penicillins Rash    Current Outpatient Medications  Medication Sig Dispense  Refill   famotidine-calcium carbonate-magnesium hydroxide (PEPCID COMPLETE) 10-800-165 MG CHEW chewable tablet Chew 1 tablet by mouth daily as needed.     GRAPE SEED ER PO Take by mouth.     ibuprofen (ADVIL) 200 MG tablet Take 200 mg by mouth every 6 (six) hours as needed.     lisinopril-hydrochlorothiazide (ZESTORETIC) 10-12.5 MG tablet TAKE 1 TABLET BY MOUTH EVERY DAY 60 tablet 0   Multiple Vitamins-Minerals (MULTIVITAMIN WITH MINERALS) tablet Take 1 tablet by mouth daily.       multivitamin-lutein (OCUVITE-LUTEIN) CAPS capsule Take 1 capsule by mouth daily.     Rivaroxaban 15 & 20 MG TBPK Follow package directions: Take one 15mg  tablet by mouth twice a day. On day 22, switch to one 20mg  tablet once a day. Take with food. 51 each 0   Current Facility-Administered Medications  Medication Dose Route Frequency Provider Last Rate Last Admin   0.9 %  sodium chloride infusion  500 mL Intravenous Continuous Nandigam, Venia Minks, MD        Review of Systems  Constitutional:  Constitutional negative. HENT: HENT negative.  Eyes: Eyes negative.  Respiratory: Respiratory negative.  Cardiovascular: Cardiovascular negative.  GI: Gastrointestinal negative.  Musculoskeletal: Positive for leg pain and joint pain.  Neurological: Neurological negative. Hematologic: Hematologic/lymphatic negative.  Psychiatric: Psychiatric negative.        Objective:  Objective   Vitals:   06/26/20 1402  BP: 136/87  Pulse: 64  Resp: 20  Temp: 98 F (36.7 C)  SpO2: 98%  Weight: 246 lb (111.6 kg)  Height: 5\' 11"  (1.803 m)   Body mass index is 34.31 kg/m.  Physical Exam HENT:     Head: Normocephalic.     Nose:     Comments: Wearing a mask    Mouth/Throat:     Mouth: Mucous membranes are moist.  Eyes:     Pupils: Pupils are equal, round, and reactive to light.  Cardiovascular:     Rate and Rhythm: Normal rate.     Pulses: Normal pulses.  Pulmonary:     Effort: Pulmonary effort is normal.    Abdominal:     General: Abdomen is flat.     Palpations: Abdomen is soft. There is no mass.  Musculoskeletal:        General: No swelling. Normal range of motion.     Cervical back: Neck supple.     Comments: Right leg is larger than left but there is no edema  Skin:    Capillary Refill: Capillary refill takes less than 2 seconds.  Neurological:     General: No focal deficit present.     Mental Status: He is alert.  Psychiatric:        Mood and Affect: Mood normal.        Behavior: Behavior normal.        Thought Content: Thought content normal.        Judgment: Judgment normal.     Data: I have independently turbid his lower extremity venous reflux study which demonstrates reflux in the right greater saphenous vein only in the proximal thigh and calf.  Greatest diameter 0.49 cm.  On the left side he has reflux in the mid thigh and proximal calf greatest diameter 0.38 cm     Assessment/Plan:    63 year old male with bilateral lower extremity anterior and posterior leg pain appears to be somewhat positional in nature not related to any activity.  He does not have any tissue loss or ulceration.  He has palpable pedal pulses.  He has minimal reflux in his lower extremities.  Given his normal vascular exam I would not attribute any of his symptoms to arterial or venous disease.  He can follow-up with me on an as-needed basis.     Waynetta Sandy MD [facility]

## 2020-07-06 ENCOUNTER — Telehealth: Payer: Self-pay | Admitting: Family Medicine

## 2020-07-06 NOTE — Telephone Encounter (Signed)
Referral Followup °

## 2020-07-10 ENCOUNTER — Telehealth: Payer: Self-pay | Admitting: Family Medicine

## 2020-07-10 NOTE — Telephone Encounter (Signed)
Pt should make an appt and follows up with Dr. Carlota Raspberry to discuss care plan moving forward.

## 2020-07-10 NOTE — Telephone Encounter (Signed)
Pt is calling and stated all the test have came back neg and is still having leg pain. Wanting to know if there is another referral he could go to or if he needs to come back into office and discuss with provider. Please advise.

## 2020-07-20 ENCOUNTER — Other Ambulatory Visit: Payer: Self-pay

## 2020-07-20 ENCOUNTER — Telehealth: Payer: Self-pay | Admitting: Family Medicine

## 2020-07-20 ENCOUNTER — Telehealth (INDEPENDENT_AMBULATORY_CARE_PROVIDER_SITE_OTHER): Payer: BC Managed Care – PPO | Admitting: Family Medicine

## 2020-07-20 ENCOUNTER — Encounter: Payer: Self-pay | Admitting: Family Medicine

## 2020-07-20 VITALS — Ht 71.0 in | Wt 243.0 lb

## 2020-07-20 DIAGNOSIS — M25561 Pain in right knee: Secondary | ICD-10-CM

## 2020-07-20 DIAGNOSIS — M79661 Pain in right lower leg: Secondary | ICD-10-CM | POA: Diagnosis not present

## 2020-07-20 DIAGNOSIS — I10 Essential (primary) hypertension: Secondary | ICD-10-CM

## 2020-07-20 DIAGNOSIS — M79662 Pain in left lower leg: Secondary | ICD-10-CM

## 2020-07-20 DIAGNOSIS — M25562 Pain in left knee: Secondary | ICD-10-CM | POA: Diagnosis not present

## 2020-07-20 MED ORDER — LISINOPRIL-HYDROCHLOROTHIAZIDE 10-12.5 MG PO TABS: 1.0000 | ORAL_TABLET | Freq: Every day | ORAL | 0 refills | Status: DC

## 2020-07-20 NOTE — Telephone Encounter (Signed)
Error

## 2020-07-20 NOTE — Patient Instructions (Addendum)
  Follow-up in the next 1 to 2 weeks for in office exam and we can evaluate for possible lumbar back source of symptoms, or knee instability symptoms.  I think physical therapy certainly could be a reasonable first step, or possible neurology if persistent symptoms to consider nerve testing.  Follow-up sooner if worse.  Let me know if there are questions  If you have lab work done today you will be contacted with your lab results within the next 2 weeks.  If you have not heard from [name] then please contact us. The fastest way to get your results is to register for My Chart.   IF you received an x-ray today, you will receive an invoice from [facility]. Please contact [facility] at [phone] with questions or concerns regarding your invoice.   IF you received labwork today, you will receive an invoice from [place]. Please contact [facility] at [phone] with questions or concerns regarding your invoice.   Our billing staff will not be able to assist you with questions regarding bills from these companies.  You will be contacted with the lab results as soon as they are available. The fastest way to get your results is to activate your My Chart account. Instructions are located on the last page of this paperwork. If you have not heard from [name] regarding the results in 2 weeks, please contact this office.

## 2020-07-20 NOTE — Progress Notes (Signed)
Virtual Visit via Video Note  I connected with Warren Hill on 07/20/20 at 5:41 PM by a video enabled telemedicine application and verified that I am speaking with the correct person using two identifiers.  Patient location: home My location: office   I discussed the limitations, risks, security and privacy concerns of performing an evaluation and management service by telephone and the availability of in person appointments. I also discussed with the patient that there may be a patient responsible charge related to this service. The patient expressed understanding and agreed to proceed, consent obtained  Chief complaint:  Chief Complaint  Patient presents with  . Follow-up    Pt reports still having pain in both legs, and it feels like shin splints, but pt isn't active to give him shin splints. pt has had some weakness in his legs. Pt states he had all the US's we ordered and all were negative. Pt is concered maybe this is a longer term effect from when he had covid back in december.     History of Present Illness: Warren Hill is a 64 y.o. male  Bilateral leg pain: Covid infection in December 2020.  Had been treated presumptively for possible DVT at that time due to difficulty obtaining ultrasound but ultimately had a negative lower extremity ultrasound for DVT in December 28.  Evaluated May with some possible asymmetry and right calf swelling.  Ultrasound was negative for DVT May 6.  Continue to experience recurrent lower extremity swelling, referred to vein and vascular specialist.  June 16 had soreness in the calves, lower legs from ankles to knees.  Felt like shinsplints.  Swelling would improve at night, but some persistent asymmetry of right greater than left calf by 1 cm.  At that time legs were the only areas of persistent soreness.  He was wearing compression socks with traveling and airplane travel.  Was taking breaks with driving. ddimer and cpk normal on 6/18.   Office visit July  9 with Dr. Donzetta Matters with vascular surgery.  Had ultrasound at vascular as well. At that time pain appear to be positional in nature, no tissue loss or ulceration, palpable pedal pulses, minimal reflux of lower extremities, and with normal vascular exam symptoms were not necessarily attributable to arterial venous disease.  As needed follow-up.  Still feels like shin splints in front, compression sock feeling in back of calf - like too tight compression. Dull consistent pain - knees down, both sides. feet are spared. Sometimes sore feeling in top of ankle. Both knees feel unstable at times. Sometimes feels like in bone. Always below knee. Some days walking up steps difficult - wobbly/pain at times.  Achilles issue on left in 2019, pulled muscles in lower leg at that time.  1st sign of covid was sore legs for 2 days, then fever in 2020, soreness in legs had improved, then pain in legs seem to have returned after 2nd Moderna vaccine (4/19).  Intermittent weakness at times in legs, but then normal at other times. Walking steps normal today.  No back pain.  No bowel or bladder incontinence, no saddle anesthesia.  Knee joints, ankle joints sore at times.  Has tried stretching exercises. Ibuprofen 400mg  BID - has helped with pain. Sleeping through the night now, worse in June.  Better with stretching and walking - some improvement in symptoms. All symptoms slight better past few weeks.   LS spine XR 09/30/2019: FINDINGS: No fracture or spondylolisthesis is noted. Moderate degenerative disc disease is noted  at L5-S1. Anterior osteophyte formation is noted at L1-2, L2-3 and L3-4. Posterior facet joints are unremarkable.  IMPRESSION: Multilevel degenerative changes as described above. No acute abnormality seen in the lumbar spine.     Patient Active Problem List   Diagnosis Date Noted  . HTN (hypertension) 01/18/2012  . Insomnia 01/18/2012  . Tinnitus 01/18/2012  . GERD 05/28/2008  . COLITIS  05/28/2008  . COLONIC POLYPS, ADENOMATOUS, HX OF 05/28/2008   Past Medical History:  Diagnosis Date  . Adenomatous polyp of colon   . GERD (gastroesophageal reflux disease)   . Hypertension   . Tinnitus of both ears    Past Surgical History:  Procedure Laterality Date  . COLONOSCOPY    . TONSILLECTOMY AND ADENOIDECTOMY  1962   Allergies  Allergen Reactions  . Other Hives and Rash  . Levofloxacin Hives and Rash    Skin got like leather  . Penicillins Rash   Prior to Admission medications   Medication Sig Start Date End Date Taking? Authorizing Provider  famotidine-calcium carbonate-magnesium hydroxide (PEPCID COMPLETE) 10-800-165 MG CHEW chewable tablet Chew 1 tablet by mouth daily as needed.   Yes [provider]  GRAPE SEED ER PO Take by mouth.   Yes [provider]  ibuprofen (ADVIL) 200 MG tablet Take 200 mg by mouth every 6 (six) hours as needed.   Yes [provider]  lisinopril-hydrochlorothiazide (ZESTORETIC) 10-12.5 MG tablet TAKE 1 TABLET BY MOUTH EVERY DAY 04/06/20  Yes [name], MD  Multiple Vitamins-Minerals (MULTIVITAMIN WITH MINERALS) tablet Take 1 tablet by mouth daily.     Yes [provider]  multivitamin-lutein (OCUVITE-LUTEIN) CAPS capsule Take 1 capsule by mouth daily.   Yes [provider]  Rivaroxaban 15 & 20 MG TBPK Follow package directions: Take one 15mg  tablet by mouth twice a day. On day 22, switch to one 20mg  tablet once a day. Take with food. 12/05/19  Yes [name], MD   Social History   Socioeconomic History  . Marital status: Married    Spouse name: Not on file  . Number of children: 3  . Years of education: Not on file  . Highest education level: Not on file  Occupational History  . Occupation: Scientist, clinical (histocompatibility and immunogenetics): [employer]  Tobacco Use  . Smoking status: Never Smoker  . Smokeless tobacco: Never Used  Vaping Use  . Vaping Use: Never used  Substance and Sexual Activity  .  Alcohol use: Yes    Alcohol/week: 14.0 standard drinks    Types: 14 Glasses of wine per week  . Drug use: No  . Sexual activity: Yes    Birth control/protection: None  Other Topics Concern  . Not on file  Social History Narrative  . Not on file   Social Determinants of Health   Financial Resource Strain:   . Difficulty of Paying Living Expenses:   Food Insecurity:   . Worried About Charity fundraiser in the Last Year:   . Arboriculturist in the Last Year:   Transportation Needs:   . Film/video editor (Medical):   Marland Kitchen Lack of Transportation (Non-Medical):   Physical Activity:   . Days of Exercise per Week:   . Minutes of Exercise per Session:   Stress:   . Feeling of Stress :   Social Connections:   . Frequency of Communication with Friends and Family:   . Frequency of Social Gatherings with Friends and Family:   .  Attends Religious Services:   . Active Member of Clubs or Organizations:   . Attends Archivist Meetings:   Marland Kitchen Marital Status:   Intimate Partner Violence:   . Fear of Current or Ex-Partner:   . Emotionally Abused:   Marland Kitchen Physically Abused:   . Sexually Abused:     Observations/Objective: Vitals:   07/20/20 1345  Weight: 243 lb (110.2 kg)  Height: 5\' 11"  (1.803 m)   No distress, appropriate responses.   Assessment and Plan: Arthralgia of both lower legs Bilateral calf pain  -Status post vascular eval without apparent vascular cause of symptoms.  With recent symptoms including some intermittent instability or relative weakness, transient weakness, possible neurologic cause, possible radiculopathy from lumbar source.  -In office exam recommended the next 1 to 2 weeks.  On that exam could consider repeat imaging of the lumbar spine, may need imaging of the knees with instability symptoms, consider physical therapy versus neurology for nerve conduction testing if persistent.  Continue symptomatic care for now with RTC precautions.  Essential  hypertension - Plan: lisinopril-hydrochlorothiazide (ZESTORETIC) 10-12.5 MG tablet    Follow Up Instructions: In office in 1-2 weeks.    I discussed the assessment and treatment plan with the patient. The patient was provided an opportunity to ask questions and all were answered. The patient agreed with the plan and demonstrated an understanding of the instructions.   The patient was advised to call back or seek an in-person evaluation if the symptoms worsen or if the condition fails to improve as anticipated.  I provided 24 minutes of non-face-to-face time during this encounter.   [name], MD

## 2020-07-30 ENCOUNTER — Ambulatory Visit: Payer: BC Managed Care – PPO

## 2020-07-30 ENCOUNTER — Encounter: Payer: Self-pay | Admitting: Family Medicine

## 2020-07-30 ENCOUNTER — Other Ambulatory Visit: Payer: Self-pay

## 2020-07-30 ENCOUNTER — Ambulatory Visit (INDEPENDENT_AMBULATORY_CARE_PROVIDER_SITE_OTHER): Payer: BC Managed Care – PPO | Admitting: Family Medicine

## 2020-07-30 ENCOUNTER — Ambulatory Visit (INDEPENDENT_AMBULATORY_CARE_PROVIDER_SITE_OTHER): Payer: BC Managed Care – PPO

## 2020-07-30 VITALS — BP 126/78 | HR 65 | Temp 97.5°F | Ht 71.0 in | Wt 249.0 lb

## 2020-07-30 DIAGNOSIS — M25562 Pain in left knee: Secondary | ICD-10-CM | POA: Diagnosis not present

## 2020-07-30 DIAGNOSIS — M25561 Pain in right knee: Secondary | ICD-10-CM

## 2020-07-30 NOTE — Patient Instructions (Addendum)
Return for x-ray only later today.  Depending on the x-ray we can decide whether physical therapy or temporary medication may help with your lower leg symptoms.  Keep follow-up with me next month with a physical and we can renew your blood pressure medication at that time.  He should have enough until that visit.  If you have lab work done today you will be contacted with your lab results within the next 2 weeks.  If you have not heard from [name] then please contact us. The fastest way to get your results is to register for My Chart.   IF you received an x-ray today, you will receive an invoice from [facility]. Please contact [facility] at [phone] with questions or concerns regarding your invoice.   IF you received labwork today, you will receive an invoice from [place]. Please contact [facility] at [phone] with questions or concerns regarding your invoice.   Our billing staff will not be able to assist you with questions regarding bills from these companies.  You will be contacted with the lab results as soon as they are available. The fastest way to get your results is to activate your My Chart account. Instructions are located on the last page of this paperwork. If you have not heard from [name] regarding the results in 2 weeks, please contact this office.

## 2020-07-30 NOTE — Progress Notes (Signed)
Subjective:  Patient ID: Warren Hill, male    DOB: 1956-08-01  Age: 64 y.o. MRN: [id number]  CC:  Chief Complaint  Patient presents with  . Follow-up    on leg pain. Pt reports the pain is in both legs now, and pt reports he is still having some instablity issues. pt states he is unsure if it's real instability or if it's all in his head. pt states the pain is always inbetween his knees and ankels. pt states he has good days and bad days since lastOV. today is what the pt calls a good day. Pt states his R leg is bigger than his L    HPI [name] presents for   Lower leg pains: See prior notes. Covid infection December 2020. Possible DVT based on symptoms but ultimately had a negative ultrasound. Ultrasound negative again in May office visit with Dr. Gwenlyn Saran with vascular surgery July 9. Ultrasound at that time. Pain appears to be positional in nature, no tissue loss/ulceration. Minimal reflux of lower extremities and normal vascular exam, symptoms were not thought to be due to arterial venous disease.  Vaccinated against Covid vaccine in April.  Video visit August 2, feeling of shin splints in the front. Compression sock feeling in the back of his casts. Still with consistent pain knees down both sides. Feet spared. Feeling of knee instability at times. Ibuprofen 400 mg twice daily was helping with discomfort, and was improving with stretching and walking at his video visit. 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  Still between knee and ankle - stiff feeling. Legs feel heavy at times. No back pain.  No bowel or bladder incontinence, no saddle anesthesia, no lower extremity weakness, feels unstable at times in knees when sore at times.  Cut back to every few days on ibuprofen.  Grape seed extract.  Stretching in am seems to help   .History Patient Active Problem List   Diagnosis Date Noted  . HTN (hypertension) 01/18/2012  . Insomnia  01/18/2012  . Tinnitus 01/18/2012  . GERD 05/28/2008  . COLITIS 05/28/2008  . COLONIC POLYPS, ADENOMATOUS, HX OF 05/28/2008   Past Medical History:  Diagnosis Date  . Adenomatous polyp of colon   . GERD (gastroesophageal reflux disease)   . Hypertension   . Tinnitus of both ears    Past Surgical History:  Procedure Laterality Date  . COLONOSCOPY    . TONSILLECTOMY AND ADENOIDECTOMY  1962   Allergies  Allergen Reactions  . Other Hives and Rash  . Levofloxacin Hives and Rash    Skin got like leather  . Penicillins Rash   Prior to Admission medications   Medication Sig Start Date End Date Taking? Authorizing Provider  famotidine-calcium carbonate-magnesium hydroxide (PEPCID COMPLETE) [id number] MG CHEW chewable tablet Chew 1 tablet by mouth daily as needed.   Yes [provider]  GRAPE SEED ER PO Take by mouth.   Yes [provider]  ibuprofen (ADVIL) 200 MG tablet Take 200 mg by mouth every 6 (six) hours as needed.   Yes [provider]  lisinopril-hydrochlorothiazide (ZESTORETIC) 10-12.5 MG tablet Take 1 tablet by mouth daily. 07/20/20  Yes [name], MD  Multiple Vitamins-Minerals (MULTIVITAMIN WITH MINERALS) tablet Take 1 tablet by mouth daily.     Yes [provider]  multivitamin-lutein (OCUVITE-LUTEIN) CAPS capsule Take 1 capsule by mouth daily.   Yes [provider]  Rivaroxaban 15 & 20 MG TBPK Follow package  directions: Take one 15mg  tablet by mouth twice a day. On day 22, switch to one 20mg  tablet once a day. Take with food. 12/05/19   [name], MD   Social History   Socioeconomic History  . Marital status: Married    Spouse name: Not on file  . Number of children: 3  . Years of education: Not on file  . Highest education level: Not on file  Occupational History  . Occupation: Scientist, clinical (histocompatibility and immunogenetics): [employer]  Tobacco Use  . Smoking status: Never Smoker  . Smokeless tobacco: Never Used  Vaping Use  .  Vaping Use: Never used  Substance and Sexual Activity  . Alcohol use: Yes    Alcohol/week: 14.0 standard drinks    Types: 14 Glasses of wine per week  . Drug use: No  . Sexual activity: Yes    Birth control/protection: None  Other Topics Concern  . Not on file  Social History Narrative  . Not on file   Social Determinants of Health   Financial Resource Strain:   . Difficulty of Paying Living Expenses:   Food Insecurity:   . Worried About Charity fundraiser in the Last Year:   . Arboriculturist in the Last Year:   Transportation Needs:   . Film/video editor (Medical):   Marland Kitchen Lack of Transportation (Non-Medical):   Physical Activity:   . Days of Exercise per Week:   . Minutes of Exercise per Session:   Stress:   . Feeling of Stress :   Social Connections:   . Frequency of Communication with Friends and Family:   . Frequency of Social Gatherings with Friends and Family:   . Attends Religious Services:   . Active Member of Clubs or Organizations:   . Attends Archivist Meetings:   Marland Kitchen Marital Status:   Intimate Partner Violence:   . Fear of Current or Ex-Partner:   . Emotionally Abused:   Marland Kitchen Physically Abused:   . Sexually Abused:     Review of Systems   Objective:   Vitals:   07/30/20 1125  BP: 126/78  Pulse: 65  Temp: (!) 97.5 F (36.4 C)  TempSrc: Temporal  SpO2: 98%  Weight: 249 lb (112.9 kg)  Height: 5\' 11"  (1.803 m)     Physical Exam Vitals reviewed.  Constitutional:      General: He is not in acute distress.    Appearance: He is well-developed.  HENT:     Head: Normocephalic and atraumatic.  Cardiovascular:     Rate and Rhythm: Normal rate.  Pulmonary:     Effort: Pulmonary effort is normal.  Musculoskeletal:     Comments: Bilateral hips, pain-free range of motion.  Does not reproduce leg symptoms.  Negative seated straight leg raise.  Right knee pain-free range of motion, no bony tenderness, no apparent effusion.    Left knee  no appreciable effusion, skin intact, no erythema.  Slight discomfort medial joint line terminal flexion, valgus greater than varus stress.  Medial joint line tenderness.  Negative McMurray, negative Lachman, drawer.  Bilateral calves, no cords, no erythema, negative Homans.  No focal tenderness.  Right tibia no focal tenderness minimal tenderness along the upper left anterior tibia, no soft tissue swelling.  Remainder of lower leg nontender.  Ankles pain-free range of motion, nontender.  No rash.  Neurological:     Mental Status: He is alert and oriented to person, place, and time.  DG Tibia/Fibula Left  Result Date: 07/30/2020 CLINICAL DATA:  Intermittent lower leg pain. EXAM: LEFT TIBIA AND FIBULA - 2 VIEW COMPARISON:  None. FINDINGS: Osseous alignment is normal. Bone mineralization is within normal limits. No fracture line or displaced fracture fragment is seen. No acute or suspicious osseous lesion. No degenerative change. Adjacent soft tissues are unremarkable. IMPRESSION: Negative. Electronically Signed   By: Franki Cabot M.D.   On: 07/30/2020 16:50   DG Tibia/Fibula Right  Result Date: 07/30/2020 CLINICAL DATA:  Intermittent lower leg pain. EXAM: RIGHT TIBIA AND FIBULA - 2 VIEW COMPARISON:  None. FINDINGS: Osseous alignment is normal. No fracture line or displaced fracture fragment. No acute or suspicious osseous lesion. No degenerative change seen. Soft tissues about the RIGHT lower extremity are unremarkable. Incidental note made chronic spurring (enthesopathy) along the posterior calcaneus corresponding to the Achilles tendon insertion site and plantar fascia insertion site. IMPRESSION: 1. No acute findings. 2. 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 Electronically Signed   By: Franki Cabot M.D.   On: 07/30/2020 16:52   DG Knee Complete 4 Views Left  Result Date: 07/30/2020 CLINICAL DATA:  Bilateral intermittent knee and lower leg pain. EXAM: LEFT KNEE -  COMPLETE 4+ VIEW COMPARISON:  None. FINDINGS: Osseous alignment is normal. Bone mineralization is within normal limits. No fracture line or displaced fracture fragment is seen. No acute or suspicious osseous lesion. No significant degenerative change. No appreciable joint effusion and adjacent soft tissues are unremarkable. IMPRESSION: Negative. Electronically Signed   By: Franki Cabot M.D.   On: 07/30/2020 16:48   DG Knee Complete 4 Views Right  Result Date: 07/30/2020 CLINICAL DATA:  Bilateral intermittent knee and lower leg pain. EXAM: RIGHT KNEE - COMPLETE 4+ VIEW COMPARISON:  None. FINDINGS: Osseous alignment is normal. Bone mineralization is within normal limits. No fracture line or displaced fracture fragment. No acute or suspicious osseous lesion. No degenerative change. No appreciable joint effusion and adjacent soft tissues are unremarkable. IMPRESSION: Negative. Electronically Signed   By: Franki Cabot M.D.   On: 07/30/2020 16:49      Assessment & Plan:  Cipriano Millikan is a 63 y.o. male . Arthralgia of both lower legs - Plan: DG Tibia/Fibula Left, DG Tibia/Fibula Right  Pain in both knees, unspecified chronicity - Plan: DG Knee Complete 4 Views Left, DG Knee Complete 4 Views Right  Recurrent lower leg/knee pains.  Reassuring imaging.  Potential soft tissue issue, previous vascular evaluation reassuring.  Less likely claudication.  Some improvement with time stretching.  Formal physical therapy may be helpful.  Will discuss with patient with note regarding x-ray results.    No orders of the defined types were placed in this encounter.  Patient Instructions   Return for x-ray only later today.  Depending on the x-ray we can decide whether physical therapy or temporary medication may help with your lower leg symptoms.  Keep follow-up with me next month with a physical and we can renew your blood pressure medication at that time.  He should have enough until that visit.  If you have  lab work done today you will be contacted with your lab results within the next 2 weeks.  If you have not heard from [name] then please contact us. The fastest way to get your results is to register for My Chart.   IF you received an x-ray today, you will receive an invoice from [facility]. Please contact [facility] at [phone] with questions or concerns regarding  your invoice.   IF you received labwork today, you will receive an invoice from [facility]. Please contact [facility] at [phone] with questions or concerns regarding your invoice.   Our billing staff will not be able to assist you with questions regarding bills from these companies.  You will be contacted with the lab results as soon as they are available. The fastest way to get your results is to activate your My Chart account. Instructions are located on the last page of this paperwork. If you have not heard from [name] regarding the results in 2 weeks, please contact this office.         Signed, Merri Ray, MD [facility]

## 2020-09-09 ENCOUNTER — Encounter: Payer: Self-pay | Admitting: Family Medicine

## 2020-09-09 ENCOUNTER — Other Ambulatory Visit: Payer: Self-pay

## 2020-09-09 ENCOUNTER — Ambulatory Visit (INDEPENDENT_AMBULATORY_CARE_PROVIDER_SITE_OTHER): Payer: BC Managed Care – PPO | Admitting: Family Medicine

## 2020-09-09 VITALS — BP 127/81 | HR 77 | Temp 98.0°F | Ht 71.0 in | Wt 255.8 lb

## 2020-09-09 DIAGNOSIS — I10 Essential (primary) hypertension: Secondary | ICD-10-CM

## 2020-09-09 DIAGNOSIS — Z125 Encounter for screening for malignant neoplasm of prostate: Secondary | ICD-10-CM

## 2020-09-09 DIAGNOSIS — Z136 Encounter for screening for cardiovascular disorders: Secondary | ICD-10-CM

## 2020-09-09 DIAGNOSIS — G4700 Insomnia, unspecified: Secondary | ICD-10-CM

## 2020-09-09 DIAGNOSIS — R0609 Other forms of dyspnea: Secondary | ICD-10-CM

## 2020-09-09 DIAGNOSIS — Z23 Encounter for immunization: Secondary | ICD-10-CM

## 2020-09-09 DIAGNOSIS — R0600 Dyspnea, unspecified: Secondary | ICD-10-CM

## 2020-09-09 DIAGNOSIS — Z0001 Encounter for general adult medical examination with abnormal findings: Secondary | ICD-10-CM | POA: Diagnosis not present

## 2020-09-09 DIAGNOSIS — Z0000 Encounter for general adult medical examination without abnormal findings: Secondary | ICD-10-CM

## 2020-09-09 MED ORDER — LISINOPRIL-HYDROCHLOROTHIAZIDE 10-12.5 MG PO TABS: 1.0000 | ORAL_TABLET | Freq: Every day | ORAL | 0 refills | Status: DC

## 2020-09-09 MED ORDER — ZOLPIDEM TARTRATE 5 MG PO TABS: 5.0000 mg | ORAL_TABLET | Freq: Every evening | ORAL | 1 refills | Status: DC | PRN

## 2020-09-09 NOTE — Patient Instructions (Addendum)
Ambien temporarily refilled if needed.  If you do require that medication more often, let me know.  No change in blood pressure medications at this time.  Typical shortness of breath with recent exertional activity only is less concerning, but I will have you evaluated by cardiology.  You should receive a call about an appointment within the next 2 weeks.  If any new shortness of breath with exertion, chest pains or other new symptoms be seen right away.  Recheck in 6 months for medication review, I will keep an eye out for the notes from orthopedics and cardiology  Preventive Care 23 Years and Older, Male Preventive care refers to lifestyle choices and visits with your health care provider that can promote health and wellness. This includes:  A yearly physical exam. This is also called an annual well check.  Regular dental and eye exams.  Immunizations.  Screening for certain conditions.  Healthy lifestyle choices, such as diet and exercise. What can I expect for my preventive care visit? Physical exam Your health care provider will check:  Height and weight. These may be used to calculate body mass index (BMI), which is a measurement that tells if you are at a healthy weight.  Heart rate and blood pressure.  Your skin for abnormal spots. Counseling Your health care provider may ask you questions about:  Alcohol, tobacco, and drug use.  Emotional well-being.  Home and relationship well-being.  Sexual activity.  Eating habits.  History of falls.  Memory and ability to understand (cognition).  Work and work Statistician. What immunizations do I need?  Influenza (flu) vaccine  This is recommended every year. Tetanus, diphtheria, and pertussis (Tdap) vaccine  You may need a Td booster every 10 years. Varicella (chickenpox) vaccine  You may need this vaccine if you have not already been vaccinated. Zoster (shingles) vaccine  You may need this after age  65. Pneumococcal conjugate (PCV13) vaccine  One dose is recommended after age 65. Pneumococcal polysaccharide (PPSV23) vaccine  One dose is recommended after age 65. Measles, mumps, and rubella (MMR) vaccine  You may need at least one dose of MMR if you were born in 1957 or later. You may also need a second dose. Meningococcal conjugate (MenACWY) vaccine  You may need this if you have certain conditions. Hepatitis A vaccine  You may need this if you have certain conditions or if you travel or work in places where you may be exposed to hepatitis A. Hepatitis B vaccine  You may need this if you have certain conditions or if you travel or work in places where you may be exposed to hepatitis B. Haemophilus influenzae type b (Hib) vaccine  You may need this if you have certain conditions. You may receive vaccines as individual doses or as more than one vaccine together in one shot (combination vaccines). Talk with your health care provider about the risks and benefits of combination vaccines. What tests do I need? Blood tests  Lipid and cholesterol levels. These may be checked every 5 years, or more frequently depending on your overall health.  Hepatitis C test.  Hepatitis B test. Screening  Lung cancer screening. You may have this screening every year starting at age 65 if you have a 30-pack-year history of smoking and currently smoke or have quit within the past 15 years.  Colorectal cancer screening. All adults should have this screening starting at age 40 and continuing until age 77. Your health care provider may recommend screening at  age 65 if you are at increased risk. You will have tests every 1-10 years, depending on your results and the type of screening test.  Prostate cancer screening. Recommendations will vary depending on your family history and other risks.  Diabetes screening. This is done by checking your blood sugar (glucose) after you have not eaten for a while  (fasting). You may have this done every 1-3 years.  Abdominal aortic aneurysm (AAA) screening. You may need this if you are a current or former smoker.  Sexually transmitted disease (STD) testing. Follow these instructions at home: Eating and drinking  Eat a diet that includes fresh fruits and vegetables, whole grains, lean protein, and low-fat dairy products. Limit your intake of foods with high amounts of sugar, saturated fats, and salt.  Take vitamin and mineral supplements as recommended by your health care provider.  Do not drink alcohol if your health care provider tells you not to drink.  If you drink alcohol: ? Limit how much you have to 0-2 drinks a day. ? Be aware of how much alcohol is in your drink. In the [place] one drink equals one 12 oz bottle of beer (355 mL), one 5 oz glass of wine (148 mL), or one 1 oz glass of hard liquor (44 mL). Lifestyle  Take daily care of your teeth and gums.  Stay active. Exercise for at least 30 minutes on 5 or more days each week.  Do not use any products that contain nicotine or tobacco, such as cigarettes, e-cigarettes, and chewing tobacco. If you need help quitting, ask your health care provider.  If you are sexually active, practice safe sex. Use a condom or other form of protection to prevent STIs (sexually transmitted infections).  Talk with your health care provider about taking a low-dose aspirin or statin. What's next?  Visit your health care provider once a year for a well check visit.  Ask your health care provider how often you should have your eyes and teeth checked.  Stay up to date on all vaccines. This information is not intended to replace advice given to you by your health care provider. Make sure you discuss any questions you have with your health care provider. Document Revised: 11/29/2018 Document Reviewed: 11/29/2018 Elsevier Patient Education  [employer].    If you have lab work done today you will be  contacted with your lab results within the next 2 weeks.  If you have not heard from [name] then please contact us. The fastest way to get your results is to register for My Chart.   IF you received an x-ray today, you will receive an invoice from [facility]. Please contact [facility] at [phone] with questions or concerns regarding your invoice.   IF you received labwork today, you will receive an invoice from [facility]. Please contact [facility] at [phone] with questions or concerns regarding your invoice.   Our billing staff will not be able to assist you with questions regarding bills from these companies.  You will be contacted with the lab results as soon as they are available. The fastest way to get your results is to activate your My Chart account. Instructions are located on the last page of this paperwork. If you have not heard from [name] regarding the results in 2 weeks, please contact this office.

## 2020-09-09 NOTE — Progress Notes (Signed)
Subjective:  Patient ID: Warren Hill, male    DOB: [birth year]  Age: 64 y.o. MRN: [id number]  CC:  Chief Complaint  Patient presents with  . Annual Exam    CPE    HPI Warren Hill presents for   Annual physical exam  Hypertension: Lisinopril HCTZ 10/12.5 mg daily. Home readings: none, no new side effects. No missed doses.  Borderline LDL 104 in July 2020. No chest pain with exertion.  Did feel winded with carrying wood, outdoor work recently, no chest pain.  No FH CAD known. Would like to meet with cardiology to decide on stress testing,   BP Readings from Last 3 Encounters:  09/09/20 127/81  07/30/20 126/78  06/26/20 136/87   Lab Results  Component Value Date   CREATININE 0.94 06/05/2020   Lower leg arthralgias: See August 12 visit, bilateral knee pain, recurrent lower leg/knee pains.  Reassuring imaging.  Potential soft tissue issue when discussed last month.  Previous vascular evaluations were reassuring.  Less likely claudication.  Some improvement with stretching.  Formal physical therapy as option. Did see PT - few visits, not sure if helpful. Still doing stretches.  Saw Dr. [name] yesterday - MRI of knee planned 10/4.   Situational insomnia: With travel usually. Has many leftover, but expired. Some difficulty with sleep d/t leg pain at times. 'last filled ambien in July 2020. #15.Controlled substance database (PDMP) reviewed. No concerns appreciated.   Fall Risk  09/09/2020 07/30/2020 07/20/2020 06/03/2020 04/22/2020  Falls in the past year? 0 0 0 0 0  Number falls in past yr: 0 - - - -  Injury with Fall? 0 - - - -  Follow up Falls evaluation completed Falls evaluation completed Falls evaluation completed Falls evaluation completed Falls evaluation completed  sufficient lighting in home No grab bars in bathroom.  No loose rugs.  1 flight stairs with banister.    Depression screen [facility] 2/9 09/09/2020 07/30/2020 07/20/2020 06/03/2020 04/22/2020  Decreased Interest 0 0 0 0 0    Down, Depressed, Hopeless 0 0 0 0 0  PHQ - 2 Score 0 0 0 0 0   Cancer screen: Colonoscopy Apr 19, 2017, repeat 5 years. Derm: [place] [facility]. Yearly exams. Lab Results  Component Value Date   PSA1 0.8 07/05/2019   PSA1 0.8 03/08/2018   PSA 0.6 11/03/2016   PSA 0.94 10/16/2015   PSA 1.17 08/06/2014  The natural history of prostate cancer and ongoing controversy regarding screening and potential treatment outcomes of prostate cancer has been discussed with the patient. The meaning of a false positive PSA and a false negative PSA has been discussed. He indicates understanding of the limitations of this screening test and wishes to proceed with screening PSA testing. No FH of prostate CA.  Immunization History  Administered Date(s) Administered  . Influenza Split 09/22/2012  . Influenza, Quadrivalent, Recombinant, Inj, Pf 08/26/2018  . Influenza,inj,Quad PF,6+ Mos 08/06/2014, 10/16/2015, 10/08/2016, 08/26/2017, 08/17/2019  . Moderna SARS-COVID-2 Vaccination 03/09/2020, 04/06/2020  . Tdap 12/19/2010  . Zoster Recombinat (Shingrix) 07/07/2019, 07/31/2019  Due for Pneumovax:   Functional Status Survey: Is the patient deaf or have difficulty hearing?: Yes Does the patient have difficulty seeing, even when wearing glasses/contacts?: No Does the patient have difficulty concentrating, remembering, or making decisions?: No Does the patient have difficulty walking or climbing stairs?: Yes Does the patient have difficulty dressing or bathing?: Yes (needs assistant putting on socks due to leg issue) Does the patient have difficulty doing errands alone such  as visiting a doctor's office or shopping?: No  Due to knee, leg issues - ortho following Wears hearing aids.  Planning to retire in a year.   6CIT Screen 09/09/2020  What Year? 0 points  What month? 0 points  What time? 0 points  Count back from 20 2 points  Months in reverse 0 points  Repeat phrase 2 points  Total Score 4  memory ok.      Office Visit from 09/09/2020 in Primary Care at [facility]  AUDIT-C Score 6    denies problem drinking- plans to cut back for weight mgt.    Hearing Screening   125Hz  250Hz  500Hz  1000Hz  2000Hz  3000Hz  4000Hz  6000Hz  8000Hz   Right ear:           Left ear:             Visual Acuity Screening   Right eye Left eye Both eyes  Without correction:     With correction: 20/40 20/30-1 20/30-1   Dental: every 6 months  Exercise: limited by knees, walking, has elliptical.   Advanced directives Does have advanced directive,  HCPOA.  History Patient Active Problem List   Diagnosis Date Noted  . HTN (hypertension) 01/18/2012  . Insomnia 01/18/2012  . Tinnitus 01/18/2012  . GERD 05/28/2008  . COLITIS 05/28/2008  . COLONIC POLYPS, ADENOMATOUS, HX OF 05/28/2008   Past Medical History:  Diagnosis Date  . Adenomatous polyp of colon   . GERD (gastroesophageal reflux disease)   . Hypertension   . Tinnitus of both ears    Past Surgical History:  Procedure Laterality Date  . COLONOSCOPY    . TONSILLECTOMY AND ADENOIDECTOMY  1962   Allergies  Allergen Reactions  . Other Hives and Rash  . Levofloxacin Hives and Rash    Skin got like leather  . Penicillins Rash   Prior to Admission medications   Medication Sig Start Date End Date Taking? Authorizing Provider  famotidine-calcium carbonate-magnesium hydroxide (PEPCID COMPLETE) [phone] MG CHEW chewable tablet Chew 1 tablet by mouth daily as needed.   Yes [provider]  GRAPE SEED ER PO Take by mouth.   Yes [provider]  ibuprofen (ADVIL) 200 MG tablet Take 200 mg by mouth every 6 (six) hours as needed.   Yes [provider]  lisinopril-hydrochlorothiazide (ZESTORETIC) 10-12.5 MG tablet Take 1 tablet by mouth daily. 07/20/20  Yes [name], MD  multivitamin-lutein [facility]) CAPS capsule Take 1 capsule by mouth daily.   Yes [provider]  Turmeric (QC TUMERIC COMPLEX PO) Take by  mouth.   Yes [provider]  Multiple Vitamins-Minerals (MULTIVITAMIN WITH MINERALS) tablet Take 1 tablet by mouth daily.   Patient not taking: Reported on 09/09/2020    [provider]   Social History   Socioeconomic History  . Marital status: Married    Spouse name: Not on file  . Number of children: 3  . Years of education: Not on file  . Highest education level: Not on file  Occupational History  . Occupation: Scientist, clinical (histocompatibility and immunogenetics): [employer]  Tobacco Use  . Smoking status: Never Smoker  . Smokeless tobacco: Never Used  Vaping Use  . Vaping Use: Never used  Substance and Sexual Activity  . Alcohol use: Yes    Alcohol/week: 14.0 standard drinks    Types: 14 Glasses of wine per week  . Drug use: No  . Sexual activity: Yes    Birth control/protection: None  Other Topics  Concern  . Not on file  Social History Narrative  . Not on file   Social Determinants of Health   Financial Resource Strain:   . Difficulty of Paying Living Expenses: Not on file  Food Insecurity:   . Worried About Charity fundraiser in the Last Year: Not on file  . Ran Out of Food in the Last Year: Not on file  Transportation Needs:   . Lack of Transportation (Medical): Not on file  . Lack of Transportation (Non-Medical): Not on file  Physical Activity:   . Days of Exercise per Week: Not on file  . Minutes of Exercise per Session: Not on file  Stress:   . Feeling of Stress : Not on file  Social Connections:   . Frequency of Communication with Friends and Family: Not on file  . Frequency of Social Gatherings with Friends and Family: Not on file  . Attends Religious Services: Not on file  . Active Member of Clubs or Organizations: Not on file  . Attends Archivist Meetings: Not on file  . Marital Status: Not on file  Intimate Partner Violence:   . Fear of Current or Ex-Partner: Not on file  . Emotionally Abused: Not on file  . Physically Abused: Not on file  .  Sexually Abused: Not on file    Review of Systems 13 point review of systems per patient health survey noted.  Negative other than as indicated above or in HPI.    Objective:   Vitals:   09/09/20 1314  BP: 127/81  Pulse: 77  Temp: 98 F (36.7 C)  TempSrc: Temporal  SpO2: 97%  Weight: 255 lb 12.8 oz (116 kg)  Height: 5' 11"  (1.803 m)     Physical Exam Vitals reviewed.  Constitutional:      Appearance: He is well-developed.  HENT:     Head: Normocephalic and atraumatic.     Right Ear: External ear normal.     Left Ear: External ear normal.  Eyes:     Conjunctiva/sclera: Conjunctivae normal.     Pupils: Pupils are equal, round, and reactive to light.  Neck:     Thyroid: No thyromegaly.     Vascular: No carotid bruit or JVD.  Cardiovascular:     Rate and Rhythm: Normal rate and regular rhythm.     Heart sounds: Normal heart sounds. No murmur heard.   Pulmonary:     Effort: Pulmonary effort is normal. No respiratory distress.     Breath sounds: Normal breath sounds. No wheezing or rales.  Abdominal:     General: There is no distension.     Palpations: Abdomen is soft.     Tenderness: There is no abdominal tenderness.  Musculoskeletal:        General: No tenderness. Normal range of motion.     Cervical back: Normal range of motion and neck supple.  Lymphadenopathy:     Cervical: No cervical adenopathy.  Skin:    General: Skin is warm and dry.  Neurological:     Mental Status: He is alert and oriented to person, place, and time.     Deep Tendon Reflexes: Reflexes are normal and symmetric.  Psychiatric:        Behavior: Behavior normal.    EKG: Sinus rhythm, flat T wave in lead III.  No apparent acute ST or T wave changes.  No prior EKG available for comparison.   Assessment & Plan:  Ryelan Kazee is a 64  y.o. male . Annual physical exam  - -anticipatory guidance as below in AVS, screening labs above. Health maintenance items as above in HPI  discussed/recommended as applicable.   Essential hypertension - Plan: Lipid panel, CMP14+EGFR, Ambulatory referral to Cardiology, lisinopril-hydrochlorothiazide (ZESTORETIC) 10-12.5 MG tablet  -  Stable, tolerating current regimen. Medications refilled. Labs pending as above.   Need for prophylactic vaccination against Streptococcus pneumoniae (pneumococcus) - Plan: Pneumococcal polysaccharide vaccine 23-valent greater than or equal to 2yo subcutaneous/IM  Screening for prostate cancer - Plan: PSA  -We discussed pros and cons of prostate cancer screening, and after this discussion, he chose to have screening done. PSA obtained  DOE (dyspnea on exertion) - Plan: Ambulatory referral to Cardiology, EKG 12-Lead Screening for cardiovascular condition - Plan: Ambulatory referral to Cardiology, EKG 12-Lead  -Recently noted after outside exertional work, but typical from what he would feel in the past with similar work.  With cardiac risk factors of age, hypertension, borderline hyperlipidemia previously, will refer to cardiology.  RTC/ER precautions given.  Insomnia, unspecified type - Plan: zolpidem (AMBIEN) 5 MG tablet  -Rare use previously, will refill for use with travel or if needed short-term with current leg pains.  Orthopedic follow-up and advanced imaging planned.  Meds ordered this encounter  Medications  . lisinopril-hydrochlorothiazide (ZESTORETIC) 10-12.5 MG tablet    Sig: Take 1 tablet by mouth daily.    Dispense:  90 tablet    Refill:  0    DX Code Needed  .  . zolpidem (AMBIEN) 5 MG tablet    Sig: Take 1 tablet (5 mg total) by mouth at bedtime as needed for sleep.    Dispense:  15 tablet    Refill:  1   Patient Instructions   Ambien temporarily refilled if needed.  If you do require that medication more often, let me know.  No change in blood pressure medications at this time.  Typical shortness of breath with recent exertional activity only is less concerning, but I will have  you evaluated by cardiology.  You should receive a call about an appointment within the next 2 weeks.  If any new shortness of breath with exertion, chest pains or other new symptoms be seen right away.  Recheck in 6 months for medication review, I will keep an eye out for the notes from orthopedics and cardiology  Preventive Care 51 Years and Older, Male Preventive care refers to lifestyle choices and visits with your health care provider that can promote health and wellness. This includes:  A yearly physical exam. This is also called an annual well check.  Regular dental and eye exams.  Immunizations.  Screening for certain conditions.  Healthy lifestyle choices, such as diet and exercise. What can I expect for my preventive care visit? Physical exam Your health care provider will check:  Height and weight. These may be used to calculate body mass index (BMI), which is a measurement that tells if you are at a healthy weight.  Heart rate and blood pressure.  Your skin for abnormal spots. Counseling Your health care provider may ask you questions about:  Alcohol, tobacco, and drug use.  Emotional well-being.  Home and relationship well-being.  Sexual activity.  Eating habits.  History of falls.  Memory and ability to understand (cognition).  Work and work Statistician. What immunizations do I need?  Influenza (flu) vaccine  This is recommended every year. Tetanus, diphtheria, and pertussis (Tdap) vaccine  You may need a Td booster every 10  years. Varicella (chickenpox) vaccine  You may need this vaccine if you have not already been vaccinated. Zoster (shingles) vaccine  You may need this after age 46. Pneumococcal conjugate (PCV13) vaccine  One dose is recommended after age 9. Pneumococcal polysaccharide (PPSV23) vaccine  One dose is recommended after age 86. Measles, mumps, and rubella (MMR) vaccine  You may need at least one dose of MMR if you were  born in 1957 or later. You may also need a second dose. Meningococcal conjugate (MenACWY) vaccine  You may need this if you have certain conditions. Hepatitis A vaccine  You may need this if you have certain conditions or if you travel or work in places where you may be exposed to hepatitis A. Hepatitis B vaccine  You may need this if you have certain conditions or if you travel or work in places where you may be exposed to hepatitis B. Haemophilus influenzae type b (Hib) vaccine  You may need this if you have certain conditions. You may receive vaccines as individual doses or as more than one vaccine together in one shot (combination vaccines). Talk with your health care provider about the risks and benefits of combination vaccines. What tests do I need? Blood tests  Lipid and cholesterol levels. These may be checked every 5 years, or more frequently depending on your overall health.  Hepatitis C test.  Hepatitis B test. Screening  Lung cancer screening. You may have this screening every year starting at age 66 if you have a 30-pack-year history of smoking and currently smoke or have quit within the past 15 years.  Colorectal cancer screening. All adults should have this screening starting at age 35 and continuing until age 71. Your health care provider may recommend screening at age 48 if you are at increased risk. You will have tests every 1-10 years, depending on your results and the type of screening test.  Prostate cancer screening. Recommendations will vary depending on your family history and other risks.  Diabetes screening. This is done by checking your blood sugar (glucose) after you have not eaten for a while (fasting). You may have this done every 1-3 years.  Abdominal aortic aneurysm (AAA) screening. You may need this if you are a current or former smoker.  Sexually transmitted disease (STD) testing. Follow these instructions at home: Eating and drinking  Eat a  diet that includes fresh fruits and vegetables, whole grains, lean protein, and low-fat dairy products. Limit your intake of foods with high amounts of sugar, saturated fats, and salt.  Take vitamin and mineral supplements as recommended by your health care provider.  Do not drink alcohol if your health care provider tells you not to drink.  If you drink alcohol: ? Limit how much you have to 0-2 drinks a day. ? Be aware of how much alcohol is in your drink. In the [place] one drink equals one 12 oz bottle of beer (355 mL), one 5 oz glass of wine (148 mL), or one 1 oz glass of hard liquor (44 mL). Lifestyle  Take daily care of your teeth and gums.  Stay active. Exercise for at least 30 minutes on 5 or more days each week.  Do not use any products that contain nicotine or tobacco, such as cigarettes, e-cigarettes, and chewing tobacco. If you need help quitting, ask your health care provider.  If you are sexually active, practice safe sex. Use a condom or other form of protection to prevent STIs (sexually transmitted infections).  Talk with your health care provider about taking a low-dose aspirin or statin. What's next?  Visit your health care provider once a year for a well check visit.  Ask your health care provider how often you should have your eyes and teeth checked.  Stay up to date on all vaccines. This information is not intended to replace advice given to you by your health care provider. Make sure you discuss any questions you have with your health care provider. Document Revised: 11/29/2018 Document Reviewed: 11/29/2018 Elsevier Patient Education  [employer].    If you have lab work done today you will be contacted with your lab results within the next 2 weeks.  If you have not heard from [name] then please contact us. The fastest way to get your results is to register for My Chart.   IF you received an x-ray today, you will receive an invoice from [facility]iology. Please contact [facility] at [phone] with questions or concerns regarding your invoice.   IF you received labwork today, you will receive an invoice from [facility]. Please contact [facility] at [phone] with questions or concerns regarding your invoice.   Our billing staff will not be able to assist you with questions regarding bills from these companies.  You will be contacted with the lab results as soon as they are available. The fastest way to get your results is to activate your My Chart account. Instructions are located on the last page of this paperwork. If you have not heard from [name] regarding the results in 2 weeks, please contact this office.          Signed, Merri Ray, MD [facility]

## 2020-09-10 LAB — LIPID PANEL
Chol/HDL Ratio: 4 ratio (ref 0.0–5.0)
Cholesterol, Total: 200 mg/dL — ABNORMAL HIGH (ref 100–199)
HDL: 50 mg/dL (ref >39)
LDL Chol Calc (NIH): 112 mg/dL — ABNORMAL HIGH (ref 0–99)
Triglycerides: 217 mg/dL — ABNORMAL HIGH (ref 0–149)
VLDL Cholesterol Cal: 38 mg/dL (ref 5–40)

## 2020-09-10 LAB — CMP14+EGFR
ALT: 25 IU/L (ref 0–44)
AST: 24 IU/L (ref 0–40)
Albumin/Globulin Ratio: 1.5 (ref 1.2–2.2)
Albumin: 4.3 g/dL (ref 3.8–4.8)
Alkaline Phosphatase: 59 IU/L (ref 44–121)
BUN/Creatinine Ratio: 13 (ref 10–24)
BUN: 11 mg/dL (ref 8–27)
Bilirubin Total: 1.2 mg/dL (ref 0.0–1.2)
CO2: 24 mmol/L (ref 20–29)
Calcium: 9.3 mg/dL (ref 8.6–10.2)
Chloride: 98 mmol/L (ref 96–106)
Creatinine, Ser: 0.84 mg/dL (ref 0.76–1.27)
GFR calc Af Amer: 106 mL/min/{1.73_m2} (ref >59)
GFR calc non Af Amer: 92 mL/min/{1.73_m2} (ref >59)
Globulin, Total: 2.9 g/dL (ref 1.5–4.5)
Glucose: 85 mg/dL (ref 65–99)
Potassium: 4.3 mmol/L (ref 3.5–5.2)
Sodium: 137 mmol/L (ref 134–144)
Total Protein: 7.2 g/dL (ref 6.0–8.5)

## 2020-09-10 LAB — PSA: Prostate Specific Ag, Serum: 0.9 ng/mL (ref 0.0–4.0)

## 2020-09-17 ENCOUNTER — Encounter: Payer: Self-pay | Admitting: Radiology

## 2020-10-08 ENCOUNTER — Other Ambulatory Visit: Payer: Self-pay

## 2020-10-08 ENCOUNTER — Encounter: Payer: Self-pay | Admitting: Internal Medicine

## 2020-10-08 ENCOUNTER — Ambulatory Visit: Payer: BC Managed Care – PPO | Admitting: Internal Medicine

## 2020-10-08 VITALS — BP 146/84 | HR 68 | Ht 71.0 in | Wt 260.0 lb

## 2020-10-08 DIAGNOSIS — R0600 Dyspnea, unspecified: Secondary | ICD-10-CM | POA: Diagnosis not present

## 2020-10-08 DIAGNOSIS — Z01810 Encounter for preprocedural cardiovascular examination: Secondary | ICD-10-CM | POA: Diagnosis not present

## 2020-10-08 DIAGNOSIS — I10 Essential (primary) hypertension: Secondary | ICD-10-CM | POA: Diagnosis not present

## 2020-10-08 DIAGNOSIS — M79605 Pain in left leg: Secondary | ICD-10-CM

## 2020-10-08 DIAGNOSIS — M79604 Pain in right leg: Secondary | ICD-10-CM

## 2020-10-08 DIAGNOSIS — R0609 Other forms of dyspnea: Secondary | ICD-10-CM

## 2020-10-08 NOTE — Patient Instructions (Signed)
Medication Instructions:  No Changes In Medications at this time.  *If you need a refill on your cardiac medications before your next appointment, please call your pharmacy*  Lab Work: None Ordered At This Time.  If you have labs (blood work) drawn today and your tests are completely normal, you will receive your results only by:  [place] (if you have MyChart) OR  A paper copy in the mail If you have any lab test that is abnormal or we need to change your treatment, we will call you to review the results.  Testing/Procedures: Your physician has requested that you have an echocardiogram. Echocardiography is a painless test that uses sound waves to create images of your heart. It provides your doctor with information about the size and shape of your heart and how well your hearts chambers and valves are working. You may receive an ultrasound enhancing agent through an IV if needed to better visualize your heart during the echo.This procedure takes approximately one hour. There are no restrictions for this procedure. This will take place at the [address]   Your physician has requested that you have a lower extremity arterial duplex. During this test, ultrasound is used to evaluate arterial blood flow in the legs. Allow one hour for this exam. There are no restrictions or special instructions. This will take place at [place], [address].  Your physician has requested that you have an ankle brachial index (ABI). During this test an ultrasound and blood pressure cuff are used to evaluate the arteries that supply the arms and legs with blood. Allow thirty minutes for this exam. There are no restrictions or special instructions. This will take place at [place], [address].  Follow-Up: At [facility], you and your health needs are our priority.  As part of our continuing mission to provide you with exceptional heart care, we have created designated  Provider Care Teams.  These Care Teams include your primary Cardiologist (physician) and Advanced Practice Providers (APPs -  Physician Assistants and Nurse Practitioners) who all work together to provide you with the care you need, when you need it.  Your next appointment:   6 week(s)  The format for your next appointment:   In Person  Provider:   Cherlynn Kaiser, MD

## 2020-10-08 NOTE — Progress Notes (Signed)
Cardiology Office Note:    Date:  10/08/2020   ID:  Thorn Demas, DOB 10-05-1956, MRN [id number]  PCP:  [name], MD  Cardiologist:  No primary care provider on file.  Electrophysiologist:  None   Referring MD: [name], MD   Chief Complaint/Reason for Referral: DOE, HTN  History of Present Illness:    Warren Hill is a 64 y.o. male with a history of HTN, GERD, HLD who presents for evaluation of DOE and HTN.   He is planning to have left knee procedure with Dr. [name] upcoming. He has a history of hypertension but does not have any exertional chest pain.  Occasionally feels winded with activity, noticed with heavy exertion he had some shortness of breath while working outside.  He had COVID-19 infection in December 2020, and afterward noticed that from the knees down he had leg discomfort like he had run a long distance.  He had right leg swelling and was concerned about DVT.  He has since had 3 lower extremity ultrasounds which have been negative.  Discomfort does not have classic claudication symptoms.  Denies PND, orthopnea, syncope.  Denies current cough, fever, chills, nausea, vomiting, diarrhea.  Past Medical History:  Diagnosis Date   Adenomatous polyp of colon    GERD (gastroesophageal reflux disease)    Hypertension    Tinnitus of both ears     Past Surgical History:  Procedure Laterality Date   COLONOSCOPY     TONSILLECTOMY AND ADENOIDECTOMY  1962    Current Medications: Current Meds  Medication Sig   famotidine-calcium carbonate-magnesium hydroxide (PEPCID COMPLETE) 10-800-165 MG CHEW chewable tablet Chew 1 tablet by mouth daily as needed.   GRAPE SEED ER PO Take by mouth.   ibuprofen (ADVIL) 200 MG tablet Take 200 mg by mouth every 6 (six) hours as needed.   lisinopril-hydrochlorothiazide (ZESTORETIC) 10-12.5 MG tablet Take 1 tablet by mouth daily.   multivitamin-lutein (OCUVITE-LUTEIN) CAPS capsule Take 1 capsule by mouth daily.    Turmeric (QC TUMERIC COMPLEX PO) Take by mouth.   zolpidem (AMBIEN) 5 MG tablet Take 1 tablet (5 mg total) by mouth at bedtime as needed for sleep.   Current Facility-Administered Medications for the 10/08/20 encounter (Office Visit) with Elouise Munroe, MD  Medication   0.9 %  sodium chloride infusion     Allergies:   Other, Levofloxacin, and Penicillins   Social History   Tobacco Use   Smoking status: Never Smoker   Smokeless tobacco: Never Used  Vaping Use   Vaping Use: Never used  Substance Use Topics   Alcohol use: Yes    Alcohol/week: 14.0 standard drinks    Types: 14 Glasses of wine per week   Drug use: No     Family History: The patient's family history includes Colon cancer in his maternal grandmother; Colon polyps in his brother, father, and mother; Diabetes in his paternal grandmother; Hypertension in his father and mother; Liver disease in his paternal uncle; Pancreatic cancer in his paternal uncle.  ROS:   Please see the history of present illness.    All other systems reviewed and are negative.  EKGs/Labs/Other Studies Reviewed:    The following studies were reviewed today:  EKG:  NSR, rate 68 bpm  Recent Labs: 09/09/2020: ALT 25; BUN 11; Creatinine, Ser 0.84; Potassium 4.3; Sodium 137  Recent Lipid Panel    Component Value Date/Time   CHOL 200 (H) 09/09/2020 1442   TRIG 217 (H) 09/09/2020 1442  HDL 50 09/09/2020 1442   CHOLHDL 4.0 09/09/2020 1442   CHOLHDL 3.3 11/03/2016 1438   VLDL 21 11/03/2016 1438   LDLCALC 112 (H) 09/09/2020 1442    Physical Exam:    VS:  BP (!) 146/84    Pulse 68    Ht 5\' 11"  (1.803 m)    Wt 260 lb (117.9 kg)    BMI 36.26 kg/m     Wt Readings from Last 5 Encounters:  10/08/20 260 lb (117.9 kg)  09/09/20 255 lb 12.8 oz (116 kg)  07/30/20 249 lb (112.9 kg)  07/20/20 243 lb (110.2 kg)  06/26/20 246 lb (111.6 kg)    Constitutional: No acute distress Eyes: sclera non-icteric, normal conjunctiva and lids ENMT:  normal dentition, moist mucous membranes Cardiovascular: regular rhythm, normal rate, no murmurs. S1 and S2 normal. Radial pulses normal bilaterally. No jugular venous distention.  Respiratory: clear to auscultation bilaterally GI : normal bowel sounds, soft and nontender. No distention.   MSK: extremities warm, well perfused. No edema.  NEURO: grossly nonfocal exam, moves all extremities. PSYCH: alert and oriented x 3, normal mood and affect.   ASSESSMENT:    1. Hypertension, unspecified type   2. Dyspnea on exertion   3. Bilateral leg pain   4. Preoperative cardiovascular examination    PLAN:    Several concerns today requiring our attention.  Hypertension-blood pressure mildly elevated today, but has otherwise been under good control.  Will observe.  Continue lisinopril HCTZ.  Dyspnea on exertion-no chest discomfort, no significant change over time.  Recent COVID-19 infection.  Would start with restratification with an echocardiogram.  Can consider stress testing if abnormalities present.  Bilateral lower extremity pain-can occur with activity, he has had multiple venous studies but no arterial studies to evaluate for PAD.  We will perform arterial lower extremity Dopplers.  Preoperative cardiovascular examination-patient appears euvolemic, with class II dyspnea with activity.  With recent Covid infection I would like to evaluate cardiovascular function with an echo prior to final comments about cardiovascular risk.  Will perform echo and follow-up afterward.   Cherlynn Kaiser, MD [place]   [facility]    Medication Adjustments/Labs and Tests Ordered: Current medicines are reviewed at length with the patient today.  Concerns regarding medicines are outlined above.   Orders Placed This Encounter  Procedures   EKG 12-Lead   ECHOCARDIOGRAM COMPLETE   VAS [name] LOWER EXTREMITY ARTERIAL DUPLEX   VAS [name] LE ART SEG MULTI (Segm&LE Reynauds)    No orders of the defined  types were placed in this encounter.   Patient Instructions  Medication Instructions:  No Changes In Medications at this time.  *If you need a refill on your cardiac medications before your next appointment, please call your pharmacy*  Lab Work: None Ordered At This Time.  If you have labs (blood work) drawn today and your tests are completely normal, you will receive your results only by:  [place] (if you have MyChart) OR  A paper copy in the mail If you have any lab test that is abnormal or we need to change your treatment, we will call you to review the results.  Testing/Procedures: Your physician has requested that you have an echocardiogram. Echocardiography is a painless test that uses sound waves to create images of your heart. It provides your doctor with information about the size and shape of your heart and how well your hearts chambers and valves are working. You may receive an ultrasound enhancing  agent through an IV if needed to better visualize your heart during the echo.This procedure takes approximately one hour. There are no restrictions for this procedure. This will take place at the [address], [address].   Your physician has requested that you have a lower extremity arterial duplex. During this test, ultrasound is used to evaluate arterial blood flow in the legs. Allow one hour for this exam. There are no restrictions or special instructions. This will take place at [place], [address].  Your physician has requested that you have an ankle brachial index (ABI). During this test an ultrasound and blood pressure cuff are used to evaluate the arteries that supply the arms and legs with blood. Allow thirty minutes for this exam. There are no restrictions or special instructions. This will take place at [place], [address].  Follow-Up: At [facility], you and your health needs are our priority.  As part of our continuing mission to provide  you with exceptional heart care, we have created designated Provider Care Teams.  These Care Teams include your primary Cardiologist (physician) and Advanced Practice Providers (APPs -  Physician Assistants and Nurse Practitioners) who all work together to provide you with the care you need, when you need it.  Your next appointment:   6 week(s)  The format for your next appointment:   In Person  Provider:   Cherlynn Kaiser, MD

## 2020-10-09 ENCOUNTER — Ambulatory Visit: Payer: BC Managed Care – PPO | Admitting: Internal Medicine

## 2020-10-15 ENCOUNTER — Ambulatory Visit: Payer: BC Managed Care – PPO | Admitting: Internal Medicine

## 2020-10-26 ENCOUNTER — Ambulatory Visit (HOSPITAL_COMMUNITY): Payer: BC Managed Care – PPO | Attending: Cardiology

## 2020-10-26 ENCOUNTER — Other Ambulatory Visit: Payer: Self-pay

## 2020-10-26 DIAGNOSIS — I10 Essential (primary) hypertension: Secondary | ICD-10-CM | POA: Diagnosis not present

## 2020-10-26 DIAGNOSIS — R0600 Dyspnea, unspecified: Secondary | ICD-10-CM | POA: Diagnosis not present

## 2020-10-26 DIAGNOSIS — R0609 Other forms of dyspnea: Secondary | ICD-10-CM

## 2020-10-26 LAB — ECHOCARDIOGRAM COMPLETE
Area-P 1/2: 2.31 cm2
S' Lateral: 2.8 cm

## 2020-10-26 MED ORDER — PERFLUTREN LIPID MICROSPHERE
1.0000 mL | INTRAVENOUS | Status: AC | PRN
  Administered 2020-10-26: 2 mL via INTRAVENOUS

## 2020-10-29 ENCOUNTER — Telehealth: Payer: Self-pay | Admitting: Internal Medicine

## 2020-10-29 NOTE — Telephone Encounter (Signed)
Patient called in to check on status of surgical clearance form. Patient states that he called [facility] on Monday and they stated that the surgical clearance form had been faxed to the office.  Advised patient that I would check tomorrow in the office to see if the form was there and if it wasn't I would request a new one from [place] at [employer]. Patient verbalized understanding.

## 2020-10-29 NOTE — Telephone Encounter (Signed)
    Pt is calling if we received a clearance request from [place] with [facility] for his upcoming surgery. He said Claiborne Billings sent it last 11/09

## 2020-10-30 ENCOUNTER — Telehealth: Payer: Self-pay

## 2020-10-30 NOTE — Telephone Encounter (Signed)
   Primary Cardiologist: Elouise Munroe, MD  Chart reviewed as part of pre-operative protocol coverage. Given past medical history and time since last visit, based on ACC/AHA guidelines, Warren Hill would be at acceptable risk for the planned procedure without further cardiovascular testing.   I will route this recommendation to the requesting party via Epic fax function and remove from pre-op pool.  Please call with questions.  Jossie Ng. Kaori Jumper NP-C    10/30/2020, 7:52 AM [place] [address] Office [phone] Fax [phone]

## 2020-10-30 NOTE — Telephone Encounter (Signed)
    [facility] Pre-operative Risk Assessment    Request for surgical clearance:  1. What type of surgery is being performed? LEFT KNEE SCOPE MEDICAL MENISECTOMY    2. When is this surgery scheduled? TBD   3. What type of clearance is required (medical clearance vs. Pharmacy clearance to hold med vs. Both)? MEDICAL  4. Are there any medications that need to be held prior to surgery and how long? NONE LISTEDE   5. Practice name and name of physician performing surgery?  [facility] Elsie Saas, MD  ATTN:KELLY  6. What is the office phone number? [phone]   7.   What is the office fax number? [phone]  8.   Anesthesia type (None, local, MAC, general) ? NONE LISTED-[id number]

## 2020-10-30 NOTE — Telephone Encounter (Signed)
Spoke with patient, advised patient that the surgical clearance form was received, advised patient that this would have to be reviewed through Pre Op. Patient verbalized understanding.

## 2020-11-02 ENCOUNTER — Ambulatory Visit (HOSPITAL_COMMUNITY)
Admission: RE | Admit: 2020-11-02 | Discharge: 2020-11-02 | Disposition: A | Discharge status: Home / Self Care | Payer: BC Managed Care – PPO | Source: Ambulatory Visit | Attending: Internal Medicine | Admitting: Internal Medicine

## 2020-11-02 ENCOUNTER — Other Ambulatory Visit: Payer: Self-pay

## 2020-11-02 DIAGNOSIS — M79604 Pain in right leg: Secondary | ICD-10-CM | POA: Diagnosis not present

## 2020-11-02 DIAGNOSIS — R0600 Dyspnea, unspecified: Secondary | ICD-10-CM | POA: Insufficient documentation

## 2020-11-02 DIAGNOSIS — M79605 Pain in left leg: Secondary | ICD-10-CM | POA: Diagnosis not present

## 2020-11-02 DIAGNOSIS — R0609 Other forms of dyspnea: Secondary | ICD-10-CM

## 2020-11-18 HISTORY — PX: KNEE ARTHROSCOPY: SUR90

## 2020-11-30 ENCOUNTER — Encounter: Payer: Self-pay | Admitting: Internal Medicine

## 2020-11-30 ENCOUNTER — Ambulatory Visit: Payer: BC Managed Care – PPO | Admitting: Internal Medicine

## 2020-11-30 ENCOUNTER — Other Ambulatory Visit: Payer: Self-pay

## 2020-11-30 VITALS — BP 110/62 | HR 70 | Ht 71.0 in | Wt 258.0 lb

## 2020-11-30 DIAGNOSIS — M79604 Pain in right leg: Secondary | ICD-10-CM

## 2020-11-30 DIAGNOSIS — E782 Mixed hyperlipidemia: Secondary | ICD-10-CM

## 2020-11-30 DIAGNOSIS — I10 Essential (primary) hypertension: Secondary | ICD-10-CM | POA: Diagnosis not present

## 2020-11-30 DIAGNOSIS — R0600 Dyspnea, unspecified: Secondary | ICD-10-CM

## 2020-11-30 DIAGNOSIS — R0609 Other forms of dyspnea: Secondary | ICD-10-CM

## 2020-11-30 DIAGNOSIS — M79605 Pain in left leg: Secondary | ICD-10-CM

## 2020-11-30 NOTE — Progress Notes (Signed)
Cardiology Office Note:    Date:  11/30/2020   ID:  Warren Hill, DOB [birth year], MRN [id number]  PCP:  [name], MD  Cardiologist:  Elouise Munroe, MD  Electrophysiologist:  None   Referring MD: [name], MD   Chief Complaint/Reason for Referral: Follow up bilateral leg pain, HLD, HTN, DOE  History of Present Illness:    Warren Hill is a 64 y.o. male with a history of HTN, GERD, HLD who presents for follow up evaluation of DOE and HTN.   Left knee surgery done. 11/24/20. Needed some antibiotics afterward for some incisional bleeding.   Leg pain not as bad. When he had COVID, it started with leg pain - felt like ran a marathon. Right leg was swollen. DVT u/s has been negative x 3  Nov 27, 2019 had COVID. No cough, no CP associated. Has not had respiratory symptoms.   Reviewed LE arterial studies which were normal and Echo.   Has not had CP, continues to have some mild exertional dyspnea. He plans to increase his activity level. If he has more SOB or new CP, I have asked him to call our office. At that time we can reassess need for stress test.   He has 3 sons, one in special forces, one who is an Therapist, sports, and one who is a missionary with a coffee company, recently in [place].   BP under good control with lisinopril HCTZ. We reviewed lipids and diet control vs statin.   Past Medical History:  Diagnosis Date  . Adenomatous polyp of colon   . GERD (gastroesophageal reflux disease)   . Hypertension   . Tinnitus of both ears     Past Surgical History:  Procedure Laterality Date  . COLONOSCOPY    . TONSILLECTOMY AND ADENOIDECTOMY  1962    Current Medications: Current Meds  Medication Sig  . cephALEXin (KEFLEX) 500 MG capsule Take 500 mg by mouth 4 (four) times daily.  . famotidine-calcium carbonate-magnesium hydroxide (PEPCID COMPLETE) 10-800-165 MG CHEW chewable tablet Chew 1 tablet by mouth daily as needed.  Marland Kitchen GRAPE SEED ER PO Take by mouth.  Marland Kitchen ibuprofen  (ADVIL) 200 MG tablet Take 200 mg by mouth every 6 (six) hours as needed.  Marland Kitchen lisinopril-hydrochlorothiazide (ZESTORETIC) 10-12.5 MG tablet Take 1 tablet by mouth daily.  . Multiple Vitamins-Minerals (MULTIVITAMIN WITH MINERALS) tablet Take 1 tablet by mouth daily.  . multivitamin-lutein (OCUVITE-LUTEIN) CAPS capsule Take 1 capsule by mouth daily.  . Turmeric (QC TUMERIC COMPLEX PO) Take by mouth.  . zolpidem (AMBIEN) 5 MG tablet Take 1 tablet (5 mg total) by mouth at bedtime as needed for sleep.   Current Facility-Administered Medications for the 11/30/20 encounter (Office Visit) with Elouise Munroe, MD  Medication  . 0.9 %  sodium chloride infusion     Allergies:   Other, Levofloxacin, and Penicillins   Social History   Tobacco Use  . Smoking status: Never Smoker  . Smokeless tobacco: Never Used  Vaping Use  . Vaping Use: Never used  Substance Use Topics  . Alcohol use: Yes    Alcohol/week: 14.0 standard drinks    Types: 14 Glasses of wine per week  . Drug use: No     Family History: The patient's family history includes Colon cancer in his maternal grandmother; Colon polyps in his brother, father, and mother; Diabetes in his paternal grandmother; Hypertension in his father and mother; Liver disease in his paternal uncle; Pancreatic cancer in his paternal  uncle.  ROS:   Please see the history of present illness.    All other systems reviewed and are negative.  EKGs/Labs/Other Studies Reviewed:    The following studies were reviewed today:  Recent Labs: 09/09/2020: ALT 25; BUN 11; Creatinine, Ser 0.84; Potassium 4.3; Sodium 137  Recent Lipid Panel    Component Value Date/Time   CHOL 200 (H) 09/09/2020 1442   TRIG 217 (H) 09/09/2020 1442   HDL 50 09/09/2020 1442   CHOLHDL 4.0 09/09/2020 1442   CHOLHDL 3.3 11/03/2016 1438   VLDL 21 11/03/2016 1438   LDLCALC 112 (H) 09/09/2020 1442    Physical Exam:    VS:  BP 110/62   Pulse 70   Ht 5\' 11"  (1.803 m)   Wt 258  lb (117 kg)   SpO2 96%   BMI 35.98 kg/m     Wt Readings from Last 5 Encounters:  11/30/20 258 lb (117 kg)  10/08/20 260 lb (117.9 kg)  09/09/20 255 lb 12.8 oz (116 kg)  07/30/20 249 lb (112.9 kg)  07/20/20 243 lb (110.2 kg)    Constitutional: No acute distress Eyes: sclera non-icteric, normal conjunctiva and lids ENMT: normal dentition, moist mucous membranes Cardiovascular: regular rhythm, normal rate, no murmurs. S1 and S2 normal. Radial pulses normal bilaterally. No jugular venous distention.  Respiratory: clear to auscultation bilaterally GI : normal bowel sounds, soft and nontender. No distention.   MSK: extremities warm, well perfused. No edema.  NEURO: grossly nonfocal exam, moves all extremities. PSYCH: alert and oriented x 3, normal mood and affect.   ASSESSMENT:    1. Dyspnea on exertion   2. Bilateral leg pain   3. Hypertension, unspecified type   4. Mixed hyperlipidemia    PLAN:    Dyspnea on exertion - stable. Will see how he feels when he resumes his activities, if continued SOB or new CP, will pursue stress testing.   Bilateral leg pain - LE arterial studies negative. Continue to evaluate with PCP.   Hypertension, unspecified type - BP well controlled on current therapy, continue lisinopril HCTZ.   Mixed hyperlipidemia - Plan: Lipid panel - discussed in detail. Will have him pursue diet and lifestyle modification. If LDL >70 at next visit, will consider adding low dose statin.   Total time of encounter: 30 minutes total time of encounter, including 20 minutes spent in face-to-face patient care on the date of this encounter. This time includes coordination of care and counseling regarding above mentioned problem list. Remainder of non-face-to-face time involved reviewing chart documents/testing relevant to the patient encounter and documentation in the medical record. I have independently reviewed documentation from referring provider.   Cherlynn Kaiser,  MD [place]  [facility]    Medication Adjustments/Labs and Tests Ordered: Current medicines are reviewed at length with the patient today.  Concerns regarding medicines are outlined above.   Orders Placed This Encounter  Procedures  . Lipid panel     No orders of the defined types were placed in this encounter.   Patient Instructions  Medication Instructions:  No Changes In Medications at this time.  *If you need a refill on your cardiac medications before your next appointment, please call your pharmacy*  Lab Work: FASTING LIPID PANEL- PLEASE HAVE THIS DONE PRIOR TO NEXT APPOINTMENT IN 6 MONTHS. OUR LAB IS OPEN MON-FRI from 8am to 4pm. NO APPOINTMENT REQUIRED  If you have labs (blood work) drawn today and your tests are completely normal, you will receive your  results only by: [place] MyChart Message (if you have MyChart) OR . A paper copy in the mail If you have any lab test that is abnormal or we need to change your treatment, we will call you to review the results.  Follow-Up: At [facility], you and your health needs are our priority.  As part of our continuing mission to provide you with exceptional heart care, we have created designated Provider Care Teams.  These Care Teams include your primary Cardiologist (physician) and Advanced Practice Providers (APPs -  Physician Assistants and Nurse Practitioners) who all work together to provide you with the care you need, when you need it.  Your next appointment:   6 month(s)  The format for your next appointment:   In Person  Provider:   Cherlynn Kaiser, MD

## 2020-11-30 NOTE — Patient Instructions (Signed)
Medication Instructions:  No Changes In Medications at this time.  *If you need a refill on your cardiac medications before your next appointment, please call your pharmacy*  Lab Work: FASTING LIPID PANEL- PLEASE HAVE THIS DONE PRIOR TO NEXT APPOINTMENT IN 6 MONTHS. OUR LAB IS OPEN MON-FRI from 8am to 4pm. NO APPOINTMENT REQUIRED  If you have labs (blood work) drawn today and your tests are completely normal, you will receive your results only by: [place] MyChart Message (if you have MyChart) OR . A paper copy in the mail If you have any lab test that is abnormal or we need to change your treatment, we will call you to review the results.  Follow-Up: At [facility], you and your health needs are our priority.  As part of our continuing mission to provide you with exceptional heart care, we have created designated Provider Care Teams.  These Care Teams include your primary Cardiologist (physician) and Advanced Practice Providers (APPs -  Physician Assistants and Nurse Practitioners) who all work together to provide you with the care you need, when you need it.  Your next appointment:   6 month(s)  The format for your next appointment:   In Person  Provider:   Cherlynn Kaiser, MD

## 2020-12-23 ENCOUNTER — Other Ambulatory Visit: Payer: Self-pay | Admitting: Family Medicine

## 2020-12-23 DIAGNOSIS — I10 Essential (primary) hypertension: Secondary | ICD-10-CM

## 2021-03-18 ENCOUNTER — Other Ambulatory Visit: Payer: Self-pay | Admitting: Family Medicine

## 2021-03-18 DIAGNOSIS — I10 Essential (primary) hypertension: Secondary | ICD-10-CM

## 2021-03-18 NOTE — Telephone Encounter (Signed)
Requested medications are due for refill today.  yes  Requested medications are on the active medications list.  yes  Last refill. [id number]  Future visit scheduled.   no  Notes to clinic.

## 2021-04-15 MED ORDER — LISINOPRIL-HYDROCHLOROTHIAZIDE 10-12.5 MG PO TABS: 1.0000 | ORAL_TABLET | Freq: Every day | ORAL | 0 refills | Status: DC

## 2021-04-15 NOTE — Addendum Note (Signed)
Addended by: Jacarie Pate R on: 04/15/2021 07:05 PM   Modules accepted: Orders  

## 2021-05-07 ENCOUNTER — Other Ambulatory Visit: Payer: Self-pay

## 2021-05-07 DIAGNOSIS — E782 Mixed hyperlipidemia: Secondary | ICD-10-CM

## 2021-05-19 ENCOUNTER — Ambulatory Visit (INDEPENDENT_AMBULATORY_CARE_PROVIDER_SITE_OTHER): Payer: BC Managed Care – PPO | Admitting: Family Medicine

## 2021-05-19 ENCOUNTER — Encounter: Payer: Self-pay | Admitting: Family Medicine

## 2021-05-19 ENCOUNTER — Other Ambulatory Visit: Payer: Self-pay

## 2021-05-19 VITALS — BP 132/76 | HR 82 | Temp 98.2°F | Resp 16 | Ht 71.0 in | Wt 265.4 lb

## 2021-05-19 DIAGNOSIS — I10 Essential (primary) hypertension: Secondary | ICD-10-CM

## 2021-05-19 DIAGNOSIS — E785 Hyperlipidemia, unspecified: Secondary | ICD-10-CM

## 2021-05-19 DIAGNOSIS — M7989 Other specified soft tissue disorders: Secondary | ICD-10-CM | POA: Diagnosis not present

## 2021-05-19 DIAGNOSIS — D235 Other benign neoplasm of skin of trunk: Secondary | ICD-10-CM

## 2021-05-19 DIAGNOSIS — G4700 Insomnia, unspecified: Secondary | ICD-10-CM

## 2021-05-19 MED ORDER — LISINOPRIL-HYDROCHLOROTHIAZIDE 10-12.5 MG PO TABS: 1.0000 | ORAL_TABLET | Freq: Every day | ORAL | 1 refills | Status: DC

## 2021-05-19 NOTE — Progress Notes (Signed)
Subjective:  Patient ID: Warren Hill, male    DOB: 1956/07/16  Age: 65 y.o. MRN: [id number]  CC:  Chief Complaint  Patient presents with  . Edema    Pt reports has swelling in his calf, notes it is on and off has done several [name]. No worsening of sxs, would like to do a check in to ensure still doing okay. Pt states cardiology is requesting lipid test before their next visit together and he is asking this be done here today.     HPI Warren Hill presents for   Recurrent Swelling of calf See prior visits.  Has had recurrent Swelling/prominence.  Multiple negative previous ultrasounds.  Initially had COVID infection December 2020 and possible DVT based on symptoms but ultimately had a negative ultrasound at that time.  Repeat ultrasound negative in May 2021, and again with vascular surgeon Dr. Gwenlyn Hill June 26, 2020.  Minimal reflux noted in his lower extremities, normal vascular exam, symptoms were not thought to be due to arterial venous disease.  Ultimately thought to be possible soft tissue issue.  Stretching was helpful.  Minimal improvement with PT.  Seen by orthopedist Dr. Noemi Hill, ultimately had left knee surgery November 24, 2020.  No further pain in legs/shins.  Feels like R calf has looked more prominent since he started wearing shorts. No pain, no new CP/dyspnea. Possible tight feeling. No skin changes/wounds.  Some long driving , but usually stopping for break every 129miles. No recent air travel.  Had MRI of knee and lower legs bilaterally by ortho last year - some possible fluid in muscle but no other concerns.   Hyperlipidemia: Discussed with cardiology November 30 2020.  Planned diet and lifestyle modification.  If LDL greater than 70 at follow-up visit, consider adding low-dose statin.  He is fasting today.  Appointment June 17 with cardiology.  Min changes in diet. Starting to walk more recently as cleared from knee surgery.  Fasting today.  Wt Readings from Last 3 Encounters:   05/19/21 265 lb 6.4 oz (120.4 kg)  11/30/20 258 lb (117 kg)  10/08/20 260 lb (117.9 kg)    Lab Results  Component Value Date   CHOL 200 (H) 09/09/2020   HDL 50 09/09/2020   LDLCALC 112 (H) 09/09/2020   TRIG 217 (H) 09/09/2020   CHOLHDL 4.0 09/09/2020   Lab Results  Component Value Date   ALT 25 09/09/2020   AST 24 09/09/2020   ALKPHOS 59 09/09/2020   BILITOT 1.2 09/09/2020    Hypertension: Lisinopril HCTZ 10/12.5 mg daily, no new side effects.  Home readings: none.  BP Readings from Last 3 Encounters:  05/19/21 132/76  11/30/20 110/62  10/26/20 130/82   Lab Results  Component Value Date   CREATININE 0.84 09/09/2020   Insomnia Situational, noted with travel, has taken Ambien 5 mg as needed previously.Controlled substance database (PDMP) reviewed. No concerns appreciated. Hydrocodone listing 11/17/20, prior ambien 09/09/20. Still has some left - few left. Will call when needed.   End of visit noted concern of a possible mole on his right chest wall.  No significant recent changes but he does have appointment with dermatology in 2 months.   History Patient Active Problem List   Diagnosis Date Noted  . HTN (hypertension) 01/18/2012  . Insomnia 01/18/2012  . Tinnitus 01/18/2012  . GERD 05/28/2008  . COLITIS 05/28/2008  . COLONIC POLYPS, ADENOMATOUS, HX OF 05/28/2008   Past Medical History:  Diagnosis Date  . Adenomatous polyp of colon   .  GERD (gastroesophageal reflux disease)   . Hypertension   . Tinnitus of both ears    Past Surgical History:  Procedure Laterality Date  . COLONOSCOPY    . TONSILLECTOMY AND ADENOIDECTOMY  1962   Allergies  Allergen Reactions  . Other Hives and Rash  . Levofloxacin Hives and Rash    Skin got like leather  . Penicillins Rash   Prior to Admission medications   Medication Sig Start Date End Date Taking? Authorizing Provider  cephALEXin (KEFLEX) 500 MG capsule Take 500 mg by mouth 4 (four) times daily. 11/23/20  Yes  [provider]  famotidine-calcium carbonate-magnesium hydroxide (PEPCID COMPLETE) 10-800-165 MG CHEW chewable tablet Chew 1 tablet by mouth daily as needed.   Yes [provider]  GRAPE SEED ER PO Take by mouth.   Yes [provider]  ibuprofen (ADVIL) 200 MG tablet Take 200 mg by mouth every 6 (six) hours as needed.   Yes [provider]  lisinopril-hydrochlorothiazide (ZESTORETIC) 10-12.5 MG tablet Take 1 tablet by mouth daily. 04/15/21  Yes Warren Agreste, MD  Multiple Vitamins-Minerals (MULTIVITAMIN WITH MINERALS) tablet Take 1 tablet by mouth daily.   Yes [provider]  multivitamin-lutein (OCUVITE-LUTEIN) CAPS capsule Take 1 capsule by mouth daily.   Yes [provider]  Turmeric (QC TUMERIC COMPLEX PO) Take by mouth.   Yes [provider]  zolpidem (AMBIEN) 5 MG tablet Take 1 tablet (5 mg total) by mouth at bedtime as needed for sleep. 09/09/20  Yes Warren Agreste, MD   Social History   Socioeconomic History  . Marital status: Married    Spouse name: Not on file  . Number of children: 3  . Years of education: Not on file  . Highest education level: Not on file  Occupational History  . Occupation: Scientist, clinical (histocompatibility and immunogenetics): [employer]  Tobacco Use  . Smoking status: Never Smoker  . Smokeless tobacco: Never Used  Vaping Use  . Vaping Use: Never used  Substance and Sexual Activity  . Alcohol use: Yes    Alcohol/week: 14.0 standard drinks    Types: 14 Glasses of wine per week  . Drug use: No  . Sexual activity: Yes    Birth control/protection: None  Other Topics Concern  . Not on file  Social History Narrative  . Not on file   Social Determinants of Health   Financial Resource Strain: Not on file  Food Insecurity: Not on file  Transportation Needs: Not on file  Physical Activity: Not on file  Stress: Not on file  Social Connections: Not on file  Intimate Partner Violence: Not on file    Review of  Systems  Constitutional: Negative for fatigue and unexpected weight change.  Eyes: Negative for visual disturbance.  Respiratory: Negative for cough, chest tightness and shortness of breath (no new dyspnea).   Cardiovascular: Negative for chest pain, palpitations and leg swelling.  Gastrointestinal: Negative for abdominal pain and blood in stool.  Neurological: Negative for dizziness, light-headedness and headaches.     Objective:   Vitals:   05/19/21 0927  BP: 132/76  Pulse: 82  Resp: 16  Temp: 98.2 F (36.8 C)  TempSrc: Temporal  SpO2: 97%  Weight: 265 lb 6.4 oz (120.4 kg)  Height: 5\' 11"  (1.803 m)     Physical Exam Vitals reviewed.  Constitutional:      Appearance: He is well-developed.  HENT:     Head: Normocephalic and atraumatic.  Eyes:  Pupils: Pupils are equal, round, and reactive to light.  Neck:     Vascular: No carotid bruit or JVD.  Cardiovascular:     Rate and Rhythm: Normal rate and regular rhythm.     Heart sounds: Normal heart sounds. No murmur heard.   Pulmonary:     Effort: Pulmonary effort is normal.     Breath sounds: Normal breath sounds. No rales.  Musculoskeletal:     Right lower leg: No edema.     Left lower leg: No edema.     Comments: Right calf prominent/slightly enlarged compared to left, measured 44 cm on left at 15 cm below patella versus 45 0.5 cm on the right.  Negative Homans, nontender, no cords.  Skin:    General: Skin is warm and dry.     Comments: Right chest wall brown nevus, round, slight elevation, approximately 2 to 3 mm.  No surrounding erythema, no bleeding.  Neurological:     Mental Status: He is alert and oriented to person, place, and time.     Assessment & Plan:  Dre Gamino is a 65 y.o. male . Calf swelling  - appears stable, ultrasound was negative previously multiple times.  Without pain, cords, negative Homans, unlikely DVT.  Ultrasound offered but declined at this time.  RTC/ER precautions  given.  Essential hypertension  -Stable, continue same regimen  Hyperlipidemia, unspecified hyperlipidemia type  -Increase exercise planned, but check labs, consider statin if elevated.  Has cardiology follow-up planned.  Dermal nevus of chest  -Benign appearing but advised to keep follow-up with dermatology with sooner visit if any changes.  RTC precautions.  Insomnia, unspecified type  -Stable with infrequent dosing of Ambien.  No changes.  No orders of the defined types were placed in this encounter.  Patient Instructions  Keep follow-up with dermatology as planned.  If any changes to the mole on the right chest wall be seen sooner.  Without calf pain or apparent changes I think it is reasonable to hold on ultrasound for now but if you would like me to order ultrasound or any new symptoms let me know and I will be happy to place that order.  No change in blood pressure medications today.  I will check labs, cardiology should be able to see those at your upcoming appointment but you will likely hear from me as well.     Signed, Merri Ray, MD [facility]

## 2021-05-19 NOTE — Patient Instructions (Signed)
Keep follow-up with dermatology as planned.  If any changes to the mole on the right chest wall be seen sooner.  Without calf pain or apparent changes I think it is reasonable to hold on ultrasound for now but if you would like me to order ultrasound or any new symptoms let me know and I will be happy to place that order.  No change in blood pressure medications today.  I will check labs, cardiology should be able to see those at your upcoming appointment but you will likely hear from me as well.

## 2021-06-02 NOTE — Progress Notes (Signed)
Cardiology Office Note:    Date:  06/04/2021   ID:  Warren Hill, DOB 01-30-1956, MRN [id number]  PCP:  [name], MD  Cardiologist:  Elouise Munroe, MD  Electrophysiologist:  None   Referring MD: [name], MD   Chief Complaint/Reason for Referral: Follow up bilateral leg pain, HLD, HTN, DOE  History of Present Illness:    Warren Hill is a 65 y.o. male with a history of HTN, GERD, HLD who presents for follow up evaluation of DOE and HTN. Left knee surgery done. 11/24/20. Needed some antibiotics afterward for some incisional bleeding.   When he had COVID, it started with leg pain - felt like ran a marathon. Right leg was swollen. DVT u/s has been negative x 3. Nov 27, 2019 had COVID. No cough, no CP associated. Has not had respiratory symptoms.   He has 3 sons, one in special forces, one who is an Therapist, sports, and one who is a missionary with a coffee company, recently in [place].   Today, he is feeling good overall. He continues to have exertional shortness of breath that he is attributing to age and being out of shape. Lately, he is walking more and recognizes he is deconditioned. He reports his "shin-splint" pains have resolved.   His most recent blood test was 05/19/2021 but the results are pending. He is not currently taking cholesterol medication, and LDL and triglycerides have been high previously. We discussed the option of statin therapy, and patient inquires about using injectable medications for cholesterol management.  We discussed PCSK9 inhibitors.  In his family, his mother and father both had hypertension and hyperlipidemia.  He denies any chest pain, or palpitations. No headaches, lightheadedness, or syncope to report. Also has no lower extremity edema, orthopnea or PND.  Past Medical History:  Diagnosis Date   Adenomatous polyp of colon    GERD (gastroesophageal reflux disease)    Hypertension    Tinnitus of both ears     Past Surgical History:  Procedure  Laterality Date   COLONOSCOPY     TONSILLECTOMY AND ADENOIDECTOMY  1962    Current Medications: Current Meds  Medication Sig   famotidine-calcium carbonate-magnesium hydroxide (PEPCID COMPLETE) 10-800-165 MG CHEW chewable tablet Chew 1 tablet by mouth daily as needed.   ibuprofen (ADVIL) 200 MG tablet Take 200 mg by mouth every 6 (six) hours as needed.   lisinopril-hydrochlorothiazide (ZESTORETIC) 10-12.5 MG tablet Take 1 tablet by mouth daily.   Multiple Vitamins-Minerals (MULTIVITAMIN WITH MINERALS) tablet Take 1 tablet by mouth daily.   multivitamin-lutein (OCUVITE-LUTEIN) CAPS capsule Take 1 capsule by mouth daily.   zolpidem (AMBIEN) 5 MG tablet Take 1 tablet (5 mg total) by mouth at bedtime as needed for sleep.   [DISCONTINUED] cephALEXin (KEFLEX) 500 MG capsule Take 500 mg by mouth 4 (four) times daily.   Current Facility-Administered Medications for the 06/04/21 encounter (Office Visit) with Elouise Munroe, MD  Medication   0.9 %  sodium chloride infusion     Allergies:   Other, Levofloxacin, and Penicillins   Social History   Tobacco Use   Smoking status: Never   Smokeless tobacco: Never  Vaping Use   Vaping Use: Never used  Substance Use Topics   Alcohol use: Yes    Alcohol/week: 14.0 standard drinks    Types: 14 Glasses of wine per week   Drug use: No     Family History: The patient's family history includes Colon cancer in his maternal grandmother; Colon  polyps in his brother, father, and mother; Diabetes in his paternal grandmother; Hypertension in his father and mother; Liver disease in his paternal uncle; Pancreatic cancer in his paternal uncle.  ROS:   Please see the history of present illness.    (+) Shortness of breath All other systems reviewed and are negative.  EKGs/Labs/Other Studies Reviewed:    The following studies were reviewed today:  [name] LE Doppler 11/02/2020: Right: Resting right ankle-brachial index indicates noncompressible right   lower extremity arteries. The right toe-brachial index is normal.   Left: Resting left ankle-brachial index is within normal range. No  evidence of significant left lower extremity arterial disease. The left  toe-brachial index is normal.   Echo 10/26/2020: 1. Left ventricular ejection fraction, by estimation, is 60 to 65%. The  left ventricle has normal function. The left ventricle has no regional  wall motion abnormalities. There is mild left ventricular hypertrophy.  Left ventricular diastolic parameters  were normal.   2. Right ventricular systolic function is normal. The right ventricular  size is mildly enlarged. There is normal pulmonary artery systolic  pressure. The estimated right ventricular systolic pressure is 36.4 mmHg.   3. The mitral valve is normal in structure. No evidence of mitral valve  regurgitation.   4. The aortic valve was not well visualized. Aortic valve regurgitation  is trivial. No aortic stenosis is present.   5. Aortic dilatation noted. There is mild dilatation of the ascending  aorta, measuring 39 mm.   6. The inferior vena cava is normal in size with greater than 50%  respiratory variability, suggesting right atrial pressure of 3 mmHg.   [name] LE Venous Reflux 06/26/2020: Right:  - No evidence of deep vein thrombosis seen in the right lower extremity,  from the common femoral through the popliteal veins.  - No evidence of superficial venous thrombosis in the right lower  extremity.  - Deep vein reflux in the CFV.  - Superficial vein reflux in the SSV, and GSV as above.   Left:  - No evidence of deep vein thrombosis seen in the left lower extremity,  from the common femoral through the popliteal veins.  - No evidence of superficial venous thrombosis in the left lower  extremity.  - Deep vein reflux in the CFV.  - Superficial vein reflux in the SSV, SFJ, and GSV as above.   EKG: 06/04/2021: NSR, rate 71 bpm 11/30/2020: EKG was not  ordered. 10/08/2020: NSR, rate 68 bpm  Recent Labs: 09/09/2020: ALT 25; BUN 11; Creatinine, Ser 0.84; Potassium 4.3; Sodium 137  Recent Lipid Panel    Component Value Date/Time   CHOL 200 (H) 09/09/2020 1442   TRIG 217 (H) 09/09/2020 1442   HDL 50 09/09/2020 1442   CHOLHDL 4.0 09/09/2020 1442   CHOLHDL 3.3 11/03/2016 1438   VLDL 21 11/03/2016 1438   LDLCALC 112 (H) 09/09/2020 1442    Physical Exam:    VS:  BP 123/76   Pulse 75   Ht 5\' 11"  (1.803 m)   Wt 267 lb 12.8 oz (121.5 kg)   SpO2 98%   BMI 37.35 kg/m     Wt Readings from Last 5 Encounters:  06/04/21 267 lb 12.8 oz (121.5 kg)  05/19/21 265 lb 6.4 oz (120.4 kg)  11/30/20 258 lb (117 kg)  10/08/20 260 lb (117.9 kg)  09/09/20 255 lb 12.8 oz (116 kg)    Constitutional: No acute distress Eyes: sclera non-icteric, normal conjunctiva and lids ENMT: normal dentition,  moist mucous membranes Cardiovascular: regular rhythm, normal rate, no murmurs. S1 and S2 normal. Radial pulses normal bilaterally. No jugular venous distention.  Respiratory: clear to auscultation bilaterally GI : normal bowel sounds, soft and nontender. No distention.   MSK: extremities warm, well perfused. No edema.  NEURO: grossly nonfocal exam, moves all extremities. PSYCH: alert and oriented x 3, normal mood and affect.   ASSESSMENT:    1. Hypertension, unspecified type   2. Dyspnea on exertion   3. Mixed hyperlipidemia   4. Bilateral leg pain     PLAN:    Dyspnea on exertion - stable. Will see how he feels when he resumes his activities, if continued SOB or new CP, will pursue stress testing.  I have counseled him on red flag symptoms to call our office with concern of worsening symptoms.  Bilateral leg pain - LE arterial studies negative.  Pain has resolved.    Hypertension, unspecified type - BP well controlled on current therapy, continue lisinopril HCTZ.   Mixed hyperlipidemia - Plan: Lipid panel -He had given blood on 05/19/2021, but lab  results are not available.  He will contact his PCPs office to see about these results or having the study redrawn.  If LDL remains elevated and triglycerides are not well controlled, will consider initiation of statin therapy.  He is also interested in PCSK9 inhibitor therapy, and may be appropriate for follow-up with lipid clinic at his next visit if therapy is indicated.  Total time of encounter: 30 minutes total time of encounter, including 20 minutes spent in face-to-face patient care on the date of this encounter. This time includes coordination of care and counseling regarding above mentioned problem list. Remainder of non-face-to-face time involved reviewing chart documents/testing relevant to the patient encounter and documentation in the medical record. I have independently reviewed documentation from referring provider.   Cherlynn Kaiser, MD [place]  [facility]    Medication Adjustments/Labs and Tests Ordered: Current medicines are reviewed at length with the patient today.  Concerns regarding medicines are outlined above.   Orders Placed This Encounter  Procedures   EKG 12-Lead      No orders of the defined types were placed in this encounter.   Patient Instructions  Medication Instructions:   No changes  *If you need a refill on your cardiac medications before your next appointment, please call your pharmacy*   Lab Work:  Contact Dr Vonna Kotyk office for results    Testing/Procedures: Not needed   Follow-Up: At [facility], you and your health needs are our priority.  As part of our continuing mission to provide you with exceptional heart care, we have created designated Provider Care Teams.  These Care Teams include your primary Cardiologist (physician) and Advanced Practice Providers (APPs -  Physician Assistants and Nurse Practitioners) who all work together to provide you with the care you need, when you need it.     Your next appointment:   5  month(s)  The format for your next appointment:   In Person  Provider:   Cherlynn Kaiser, MD     [facility] Stumpf,acting as a scribe for Elouise Munroe, MD.,have documented all relevant documentation on the behalf of Elouise Munroe, MD,as directed by  Elouise Munroe, MD while in the presence of Elouise Munroe, MD.  I, Elouise Munroe, MD, have reviewed all documentation for this visit. The documentation on 06/04/21 for the exam, diagnosis, procedures, and orders are all accurate and complete.

## 2021-06-04 ENCOUNTER — Ambulatory Visit: Payer: BC Managed Care – PPO | Admitting: Internal Medicine

## 2021-06-04 ENCOUNTER — Telehealth: Payer: Self-pay | Admitting: Family Medicine

## 2021-06-04 ENCOUNTER — Other Ambulatory Visit: Payer: Self-pay

## 2021-06-04 VITALS — BP 123/76 | HR 75 | Ht 71.0 in | Wt 267.8 lb

## 2021-06-04 DIAGNOSIS — M79604 Pain in right leg: Secondary | ICD-10-CM | POA: Diagnosis not present

## 2021-06-04 DIAGNOSIS — I10 Essential (primary) hypertension: Secondary | ICD-10-CM | POA: Diagnosis not present

## 2021-06-04 DIAGNOSIS — M79605 Pain in left leg: Secondary | ICD-10-CM

## 2021-06-04 DIAGNOSIS — R0600 Dyspnea, unspecified: Secondary | ICD-10-CM

## 2021-06-04 DIAGNOSIS — E782 Mixed hyperlipidemia: Secondary | ICD-10-CM

## 2021-06-04 DIAGNOSIS — R0609 Other forms of dyspnea: Secondary | ICD-10-CM

## 2021-06-04 NOTE — Telephone Encounter (Signed)
Pt states that he had bw done on June 1st no results in the system and the orders states needs to be collected please advise, he needs the results for his cardiology appt which is today.

## 2021-06-04 NOTE — Telephone Encounter (Signed)
Pt did have bw done, [facility] is aware of this

## 2021-06-04 NOTE — Telephone Encounter (Signed)
Lab tech [employer] Called and advised patient need to come in to redraw lab work. He stated he was on vacation and would call to come in after.

## 2021-06-04 NOTE — Telephone Encounter (Signed)
Spoke with Santiago Glad at the lab. They have no record of ever receiving any labs for this patient and orders in the system were never released. Please advise on what to do

## 2021-06-04 NOTE — Telephone Encounter (Signed)
Did patient state he has bloodwork done? Can you please have Jackelyn Poling look into this

## 2021-06-04 NOTE — Patient Instructions (Signed)
Medication Instructions:   No changes  *If you need a refill on your cardiac medications before your next appointment, please call your pharmacy*   Lab Work:  Contact Dr Vonna Kotyk office for results    Testing/Procedures: Not needed   Follow-Up: At [facility], you and your health needs are our priority.  As part of our continuing mission to provide you with exceptional heart care, we have created designated Provider Care Teams.  These Care Teams include your primary Cardiologist (physician) and Advanced Practice Providers (APPs -  Physician Assistants and Nurse Practitioners) who all work together to provide you with the care you need, when you need it.     Your next appointment:   5 month(s)  The format for your next appointment:   In Person  Provider:   Cherlynn Kaiser, MD

## 2021-09-17 ENCOUNTER — Encounter: Payer: BC Managed Care – PPO | Admitting: Family Medicine

## 2021-09-20 ENCOUNTER — Ambulatory Visit (INDEPENDENT_AMBULATORY_CARE_PROVIDER_SITE_OTHER): Payer: BC Managed Care – PPO

## 2021-09-20 ENCOUNTER — Other Ambulatory Visit: Payer: Self-pay

## 2021-09-20 ENCOUNTER — Ambulatory Visit: Payer: BC Managed Care – PPO | Admitting: Podiatry

## 2021-09-20 ENCOUNTER — Encounter: Payer: Self-pay | Admitting: Podiatry

## 2021-09-20 DIAGNOSIS — S99921A Unspecified injury of right foot, initial encounter: Secondary | ICD-10-CM

## 2021-09-20 DIAGNOSIS — S90111A Contusion of right great toe without damage to nail, initial encounter: Secondary | ICD-10-CM | POA: Diagnosis not present

## 2021-09-20 NOTE — Progress Notes (Signed)
  Subjective:  Patient ID: Warren Hill, male    DOB: [birth year],   MRN: [id number]  Chief Complaint  Patient presents with   Foot Injury    Right foot injury x 1.5 weeks. Pt states he hit the bottom of his foot on a [place] in the [place]. Pt states injury cause a open wound under his foot between the 4th and 5th toe, bruising and edema.     65 y.o. male presents for right foot injury that occurred a week and a half ago. States he was in the ocean and tripped and injured the foot and also had a laceration on the bottom. He has been soaking and treating the wound with hydrogen peroxide and neosporin. Relates continued pain over the top of the outside toes . Denies any other pedal complaints. Denies n/v/f/c.   Past Medical History:  Diagnosis Date   Adenomatous polyp of colon    GERD (gastroesophageal reflux disease)    Hypertension    Tinnitus of both ears     Objective:  Physical Exam: Vascular: DP/PT pulses 2/4 bilateral. CFT <3 seconds. Normal hair growth on digits. No edema.  Skin. 3 inch laceration noted to plantar right foot mostly healed. Small area of bruising noted distally over plantar fourth digit.  Musculoskeletal: MMT 5/5 bilateral lower extremities in DF, PF, Inversion and Eversion. Deceased ROM in DF of ankle joint. Tenderness over fourth metatarsophalangeal joint. No tenderness to wound area.  Neurological: Sensation intact to light touch.   Assessment:   1. Contusion of right great toe without damage to nail, initial encounter      Plan:  Patient was evaluated and treated and all questions answered. -Xrays reviewed no acute fractures or dislocations noted.  -Discussed treatement options for contusion of toe vs sprain of joint; risks, alternatives, and benefits explained. -Discussed that the pain will likely improve over the coming weeks.  -Wound on bottom of foot was washed and bandaid applied.  -Instructed to continue with bandaid and neosporin until healed.   -Recommend protection, rest, ice, elevation daily until symptoms improve -Rx pain med/antinflammatories as needed -Patient to return to office in 4 weeks for serial x-rays to assess healing  or sooner if condition worsens.   Lorenda Peck, DPM

## 2021-10-08 ENCOUNTER — Encounter: Payer: Self-pay | Admitting: Family Medicine

## 2021-10-08 ENCOUNTER — Ambulatory Visit (INDEPENDENT_AMBULATORY_CARE_PROVIDER_SITE_OTHER): Payer: BC Managed Care – PPO | Admitting: Family Medicine

## 2021-10-08 ENCOUNTER — Other Ambulatory Visit: Payer: Self-pay

## 2021-10-08 VITALS — BP 124/66 | HR 72 | Temp 98.0°F | Resp 15 | Ht 71.0 in | Wt 263.0 lb

## 2021-10-08 DIAGNOSIS — M2550 Pain in unspecified joint: Secondary | ICD-10-CM

## 2021-10-08 DIAGNOSIS — R208 Other disturbances of skin sensation: Secondary | ICD-10-CM

## 2021-10-08 DIAGNOSIS — I10 Essential (primary) hypertension: Secondary | ICD-10-CM | POA: Diagnosis not present

## 2021-10-08 DIAGNOSIS — R12 Heartburn: Secondary | ICD-10-CM

## 2021-10-08 DIAGNOSIS — Z131 Encounter for screening for diabetes mellitus: Secondary | ICD-10-CM

## 2021-10-08 DIAGNOSIS — G4700 Insomnia, unspecified: Secondary | ICD-10-CM

## 2021-10-08 DIAGNOSIS — Z130 Encounter for screening for diseases of the blood and blood-forming organs and certain disorders involving the immune mechanism: Secondary | ICD-10-CM | POA: Diagnosis not present

## 2021-10-08 DIAGNOSIS — Z0000 Encounter for general adult medical examination without abnormal findings: Secondary | ICD-10-CM | POA: Diagnosis not present

## 2021-10-08 DIAGNOSIS — Z0001 Encounter for general adult medical examination with abnormal findings: Secondary | ICD-10-CM

## 2021-10-08 DIAGNOSIS — E785 Hyperlipidemia, unspecified: Secondary | ICD-10-CM

## 2021-10-08 DIAGNOSIS — Z125 Encounter for screening for malignant neoplasm of prostate: Secondary | ICD-10-CM | POA: Diagnosis not present

## 2021-10-08 LAB — COMPREHENSIVE METABOLIC PANEL
ALT: 31 U/L (ref 0–53)
AST: 26 U/L (ref 0–37)
Albumin: 4.4 g/dL (ref 3.5–5.2)
Alkaline Phosphatase: 59 U/L (ref 39–117)
BUN: 11 mg/dL (ref 6–23)
CO2: 26 mEq/L (ref 19–32)
Calcium: 9.5 mg/dL (ref 8.4–10.5)
Chloride: 102 mEq/L (ref 96–112)
Creatinine, Ser: 0.89 mg/dL (ref 0.40–1.50)
GFR: 89.48 mL/min (ref >60.00)
Glucose, Bld: 90 mg/dL (ref 70–99)
Potassium: 4.3 mEq/L (ref 3.5–5.1)
Sodium: 137 mEq/L (ref 135–145)
Total Bilirubin: 1.2 mg/dL (ref 0.2–1.2)
Total Protein: 7.2 g/dL (ref 6.0–8.3)

## 2021-10-08 LAB — CBC
HCT: 44.6 % (ref 39.0–52.0)
Hemoglobin: 15.1 g/dL (ref 13.0–17.0)
MCHC: 34 g/dL (ref 30.0–36.0)
MCV: 93.6 fl (ref 78.0–100.0)
Platelets: 141 10*3/uL — ABNORMAL LOW (ref 150.0–400.0)
RBC: 4.76 Mil/uL (ref 4.22–5.81)
RDW: 13.4 % (ref 11.5–15.5)
WBC: 6.7 10*3/uL (ref 4.0–10.5)

## 2021-10-08 LAB — LIPID PANEL
Cholesterol: 187 mg/dL (ref 0–200)
HDL: 42.4 mg/dL (ref >39.00)
LDL Cholesterol: 104 mg/dL — ABNORMAL HIGH (ref 0–99)
NonHDL: 144.49
Total CHOL/HDL Ratio: 4
Triglycerides: 200 mg/dL — ABNORMAL HIGH (ref 0.0–149.0)
VLDL: 40 mg/dL (ref 0.0–40.0)

## 2021-10-08 LAB — PSA: PSA: 0.81 ng/mL (ref 0.10–4.00)

## 2021-10-08 LAB — HEMOGLOBIN A1C: Hgb A1c MFr Bld: 5.5 % (ref 4.6–6.5)

## 2021-10-08 MED ORDER — LISINOPRIL-HYDROCHLOROTHIAZIDE 10-12.5 MG PO TABS: 1.0000 | ORAL_TABLET | Freq: Every day | ORAL | 2 refills | Status: DC

## 2021-10-08 MED ORDER — ZOLPIDEM TARTRATE 5 MG PO TABS: 5.0000 mg | ORAL_TABLET | Freq: Every evening | ORAL | 1 refills | Status: DC | PRN

## 2021-10-08 NOTE — Patient Instructions (Addendum)
Try stretching before bed to see if less issues in the morning.  Pepcid AC is fine for heartburn, see foods to avoid below.  Let me know if you would like to consider meds for leg symptoms or see neuro for nerve testing.  No change in BP med today.  If cholesterol elevated, it may be worth meeting with lipid clinic or discuss with cardiology.  Thanks for coming in today.   Food Choices for Gastroesophageal Reflux Disease, Adult When you have gastroesophageal reflux disease (GERD), the foods you eat and your eating habits are very important. Choosing the right foods can help ease your discomfort. Think about working with a food expert (dietitian) to help you make good choices. What are tips for following this plan? Reading food labels Look for foods that are low in saturated fat. Foods that may help with your symptoms include: Foods that have less than 5% of daily value (DV) of fat. Foods that have 0 grams of trans fat. Cooking Do not fry your food. Cook your food by baking, steaming, grilling, or broiling. These are all methods that do not need a lot of fat for cooking. To add flavor, try to use herbs that are low in spice and acidity. Meal planning  Choose healthy foods that are low in fat, such as: Fruits and vegetables. Whole grains. Low-fat dairy products. Lean meats, fish, and poultry. Eat small meals often instead of eating 3 large meals each day. Eat your meals slowly in a place where you are relaxed. Avoid bending over or lying down until 2-3 hours after eating. Limit high-fat foods such as fatty meats or fried foods. Limit your intake of fatty foods, such as oils, butter, and shortening. Avoid the following as told by your doctor: Foods that cause symptoms. These may be different for different people. Keep a food diary to keep track of foods that cause symptoms. Alcohol. Drinking a lot of liquid with meals. Eating meals during the 2-3 hours before bed. Lifestyle Stay at a  healthy weight. Ask your doctor what weight is healthy for you. If you need to lose weight, work with your doctor to do so safely. Exercise for at least 30 minutes on 5 or more days each week, or as told by your doctor. Wear loose-fitting clothes. Do not smoke or use any products that contain nicotine or tobacco. If you need help quitting, ask your doctor. Sleep with the head of your bed higher than your feet. Use a wedge under the mattress or blocks under the bed frame to raise the head of the bed. Chew sugar-free gum after meals. What foods should eat? Eat a healthy, well-balanced diet of fruits, vegetables, whole grains, low-fat dairy products, lean meats, fish, and poultry. Each person is different. Foods that may cause symptoms in one person may not cause any symptoms in another person. Work with your doctor to find foods that are safe for you. The items listed above may not be a complete list of what you can eat and drink. Contact a food expert for more options. What foods should I avoid? Limiting some of these foods may help in managing the symptoms of GERD. Everyone is different. Talk with a food expert or your doctor to help you find the exact foods to avoid, if any. Fruits Any fruits prepared with added fat. Any fruits that cause symptoms. For some people, this may include citrus fruits, such as oranges, grapefruit, pineapple, and lemons. Vegetables Deep-fried vegetables. [place] fries. Any  vegetables prepared with added fat. Any vegetables that cause symptoms. For some people, this may include tomatoes and tomato products, chili peppers, onions and garlic, and horseradish. Grains Pastries or quick breads with added fat. Meats and other proteins High-fat meats, such as fatty beef or pork, hot dogs, ribs, ham, sausage, salami, and bacon. Fried meat or protein, including fried fish and fried chicken. Nuts and nut butters, in large amounts. Dairy Whole milk and chocolate milk. Sour cream.  Cream. Ice cream. Cream cheese. Milkshakes. Fats and oils Butter. Margarine. Shortening. Ghee. Beverages Coffee and tea, with or without caffeine. Carbonated beverages. Sodas. Energy drinks. Fruit juice made with acidic fruits, such as orange or grapefruit. Tomato juice. Alcoholic drinks. Sweets and desserts Chocolate and cocoa. Donuts. Seasonings and condiments Pepper. Peppermint and spearmint. Added salt. Any condiments, herbs, or seasonings that cause symptoms. For some people, this may include curry, hot sauce, or vinegar-based salad dressings. The items listed above may not be a complete list of what you should not eat and drink. Contact a food expert for more options. Questions to ask your doctor Diet and lifestyle changes are often the first steps that are taken to manage symptoms of GERD. If diet and lifestyle changes do not help, talk with your doctor about taking medicines. Where to find more information [employer]: aboutgerd.org Summary When you have GERD, food and lifestyle choices are very important in easing your symptoms. Eat small meals often instead of 3 large meals a day. Eat your meals slowly and in a place where you are relaxed. Avoid bending over or lying down until 2-3 hours after eating. Limit high-fat foods such as fatty meats or fried foods. This information is not intended to replace advice given to you by your health care provider. Make sure you discuss any questions you have with your health care provider. Document Revised: 06/15/2020 Document Reviewed: 06/15/2020 Elsevier Patient Education  [place] 65 Years and Older, Male Preventive care refers to lifestyle choices and visits with your health care provider that can promote health and wellness. This includes: A yearly physical exam. This is also called an annual wellness visit. Regular dental and eye exams. Immunizations. Screening for  certain conditions. Healthy lifestyle choices, such as: Eating a healthy diet. Getting regular exercise. Not using drugs or products that contain nicotine and tobacco. Limiting alcohol use. What can I expect for my preventive care visit? Physical exam Your health care provider will check your: Height and weight. These may be used to calculate your BMI (body mass index). BMI is a measurement that tells if you are at a healthy weight. Heart rate and blood pressure. Body temperature. Skin for abnormal spots. Counseling Your health care provider may ask you questions about your: Past medical problems. Family's medical history. Alcohol, tobacco, and drug use. Emotional well-being. Home life and relationship well-being. Sexual activity. Diet, exercise, and sleep habits. History of falls. Memory and ability to understand (cognition). Work and work Statistician. Access to firearms. What immunizations do I need? Vaccines are usually given at various ages, according to a schedule. Your health care provider will recommend vaccines for you based on your age, medical history, and lifestyle or other factors, such as travel or where you work. What tests do I need? Blood tests Lipid and cholesterol levels. These may be checked every 5 years, or more often depending on your overall health. Hepatitis C test. Hepatitis B test. Screening Lung cancer screening. You  may have this screening every year starting at age 30 if you have a 30-pack-year history of smoking and currently smoke or have quit within the past 15 years. Colorectal cancer screening. All adults should have this screening starting at age 7 and continuing until age 4. Your health care provider may recommend screening at age 22 if you are at increased risk. You will have tests every 1-10 years, depending on your results and the type of screening test. Prostate cancer screening. Recommendations will vary depending on your family  history and other risks. Genital exam to check for testicular cancer or hernias. Diabetes screening. This is done by checking your blood sugar (glucose) after you have not eaten for a while (fasting). You may have this done every 1-3 years. Abdominal aortic aneurysm (AAA) screening. You may need this if you are a current or former smoker. STD (sexually transmitted disease) testing, if you are at risk. Follow these instructions at home: Eating and drinking  Eat a diet that includes fresh fruits and vegetables, whole grains, lean protein, and low-fat dairy products. Limit your intake of foods with high amounts of sugar, saturated fats, and salt. Take vitamin and mineral supplements as recommended by your health care provider. Do not drink alcohol if your health care provider tells you not to drink. If you drink alcohol: Limit how much you have to 0-2 drinks a day. Be aware of how much alcohol is in your drink. In the [place] one drink equals one 12 oz bottle of beer (355 mL), one 5 oz glass of wine (148 mL), or one 1 oz glass of hard liquor (44 mL). Lifestyle Take daily care of your teeth and gums. Brush your teeth every morning and night with fluoride toothpaste. Floss one time each day. Stay active. Exercise for at least 30 minutes 5 or more days each week. Do not use any products that contain nicotine or tobacco, such as cigarettes, e-cigarettes, and chewing tobacco. If you need help quitting, ask your health care provider. Do not use drugs. If you are sexually active, practice safe sex. Use a condom or other form of protection to prevent STIs (sexually transmitted infections). Talk with your health care provider about taking a low-dose aspirin or statin. Find healthy ways to cope with stress, such as: Meditation, yoga, or listening to music. Journaling. Talking to a trusted person. Spending time with friends and family. Safety Always wear your seat belt while driving or riding in a  vehicle. Do not drive: If you have been drinking alcohol. Do not ride with someone who has been drinking. When you are tired or distracted. While texting. Wear a helmet and other protective equipment during sports activities. If you have firearms in your house, make sure you follow all gun safety procedures. What's next? Visit your health care provider once a year for an annual wellness visit. Ask your health care provider how often you should have your eyes and teeth checked. Stay up to date on all vaccines. This information is not intended to replace advice given to you by your health care provider. Make sure you discuss any questions you have with your health care provider. Document Revised: 02/12/2021 Document Reviewed: 11/29/2018 Elsevier Patient Education  2022 [employer].

## 2021-10-08 NOTE — Progress Notes (Signed)
Subjective:  Patient ID: Warren Hill, male    DOB: 1956-11-21  Age: 65 y.o. MRN: [id number]  CC:  Chief Complaint  Patient presents with   Annual Exam    Pt here for annual exam, pt notes some more cramping or stiffness in his muscles, has been occurring over the last 3 months better recently can monitor and return for future visit.    Medication Refill    Pt is in need of ambien and lisinopril HCTZ    HPI Warren Hill presents for   Annual physical exam, concerns as above  Son Warren Hill still working with [employer]. Soon to be engaged.  Retiring in December.   Care team PCP, me Cardiology Dr.Acharya Podiatry Dr. Blenda Mounts  Muscle cramping/stiffness See previous notes regarding calf pain/swelling.  Multiple negative previous ultrasounds.  Status post vascular eval.  Thought to be soft tissue issue, stretching recommended, minimal improvement with PT.  Left knee surgery December 2021.Improved at June visit.  Not on statin.  Arthralgias with COVID infection previously. Noticed some cramps or muscle aches in am. Better during day, improves with stretching after 5 min. Shoulders, upper chest, upper back. Less past few weeks. Legs still feel like has compression socks on. No swelling. Toes/feet ok. Symptoms since covid infection.  No pain. Not burning.  Denies treatment at this time.   Hypertension: Lisinopril HCTZ 10/12.5 mg daily No CP/dyspnea. Hiking for exercise on the weekends. Walking dog during week 1 hr/day. No soda, sweet tea. Occasional fast food. Plans to lose weight. Should be easier with less travel.  Wt Readings from Last 3 Encounters:  10/08/21 263 lb (119.3 kg)  06/04/21 267 lb 12.8 oz (121.5 kg)  05/19/21 265 lb 6.4 oz (120.4 kg)  Home readings: none. No new med side effects.  BP Readings from Last 3 Encounters:  10/08/21 124/66  06/04/21 123/76  05/19/21 132/76   Lab Results  Component Value Date   CREATININE 0.84 09/09/2020   GERD: Controlled with pepcid AC 5  days per week - notes with lying down in bed.  No blood in stool, abd pain, weight loss or blood in stool.   Hyperlipidemia: Diet/exercise approach planned initially.  No recent labs.  Also discussed with cardiology and option of PCSK9 inhibitor or lipid clinic discussion.  Dyspnea on exertion discussed with cardiology in June, plan for stress testing if continued shortness of breath or any chest pains. Lab Results  Component Value Date   CHOL 200 (H) 09/09/2020   HDL 50 09/09/2020   LDLCALC 112 (H) 09/09/2020   TRIG 217 (H) 09/09/2020   CHOLHDL 4.0 09/09/2020   Lab Results  Component Value Date   ALT 25 09/09/2020   AST 24 09/09/2020   ALKPHOS 59 09/09/2020   BILITOT 1.2 09/09/2020   Insomnia Situational, especially with travel, has worked well with taking Ambien intermittently. None recetly.  Controlled substance database (PDMP) reviewed. No concerns appreciated.  Last filled 09/09/20.   Fall screening Fall Risk  10/08/2021 05/19/2021 09/09/2020 07/30/2020 07/20/2020  Falls in the past year? 0 0 0 0 0  Number falls in past yr: 0 - 0 - -  Injury with Fall? 0 - 0 - -  Risk for fall due to : No Fall Risks - - - -  Follow up Falls evaluation completed Falls evaluation completed Falls evaluation completed Falls evaluation completed Falls evaluation completed   Lighting in home:adequate Loose rugs/carpets/pets: dog at home.  Stairs:with handrails.  Grab bars in bathroom: none.  Timed up and go:7 seconds, normal gait, no instability.   Depression Screening: Depression screen [facility] 2/9 10/08/2021 05/19/2021 09/09/2020 07/30/2020 07/20/2020  Decreased Interest 1 1 0 0 0  Down, Depressed, Hopeless 0 1 0 0 0  PHQ - 2 Score 1 2 0 0 0  Altered sleeping 2 1 - - -  Tired, decreased energy 2 1 - - -  Change in appetite 0 1 - - -  Feeling bad or failure about yourself  0 0 - - -  Trouble concentrating 0 0 - - -  Moving slowly or fidgety/restless 0 0 - - -  Suicidal thoughts 0 0 - - -  PHQ-9  Score 5 5 - - -  Denies depression. Some loss of interest in things with age - no recent changes.  Cancer Screening: Colonoscopy 04/19/17, repeat 5 years No FH of prostate CA.  The natural history of prostate cancer and ongoing controversy regarding screening and potential treatment outcomes of prostate cancer has been discussed with the patient. The meaning of a false positive PSA and a false negative PSA has been discussed. He indicates understanding of the limitations of this screening test and wishes to proceed with screening PSA testing. Lab Results  Component Value Date   PSA1 0.9 09/09/2020   PSA1 0.8 07/05/2019   PSA1 0.8 03/08/2018   PSA 0.6 11/03/2016   PSA 0.94 10/16/2015   PSA 1.17 08/06/2014    Immunization History  Administered Date(s) Administered   Influenza Split 09/22/2012   Influenza, Quadrivalent, Recombinant, Inj, Pf 08/26/2018   Influenza,inj,Quad PF,6+ Mos 08/06/2014, 10/16/2015, 10/08/2016, 08/26/2017, 08/17/2019   Moderna Sars-Covid-2 Vaccination 03/09/2020, 04/06/2020, 12/04/2020   Pneumococcal Polysaccharide-23 09/09/2020   Tdap 12/19/2010   Zoster Recombinat (Shingrix) 07/07/2019, 07/31/2019  Flu vaccine:  last month.  Bivalent COVID booster: scheduled today.  Due for 2nd shingrix, plans on getting at pharmacy.   Functional Status Survey: Is the patient deaf or have difficulty hearing?: No (hearing aids) Does the patient have difficulty seeing, even when wearing glasses/contacts?: No (glasses correct) Does the patient have difficulty concentrating, remembering, or making decisions?: No Does the patient have difficulty walking or climbing stairs?: No Does the patient have difficulty dressing or bathing?: No Does the patient have difficulty doing errands alone such as visiting a doctor's office or shopping?: No  Memory Screen: 6CIT Screen 10/08/2021 09/09/2020  What Year? 0 points 0 points  What month? 0 points 0 points  What time? 0 points 0 points   Count back from 20 0 points 2 points  Months in reverse 0 points 0 points  Repeat phrase 0 points 2 points  Total Score 0 4    Alcohol Screening: [place] Office Visit from 10/08/2021 in [place]  AUDIT-C Score 0     2 glasses wine per day.   Tobacco: none  Vision Screening   Right eye Left eye Both eyes  Without correction     With correction 20/30-1 20/30-1 20/30   Optho/optometry: [place] - recent visit. Macular degeneration.   Dental: Every 6 months.   Exercise: As above.   Advanced Directives:  has healthcare power of attorney, living will   History Patient Active Problem List   Diagnosis Date Noted   HTN (hypertension) 01/18/2012   Insomnia 01/18/2012   Tinnitus 01/18/2012   GERD 05/28/2008   COLITIS 05/28/2008   COLONIC POLYPS, ADENOMATOUS, HX OF 05/28/2008   Past Medical History:  Diagnosis Date   Adenomatous polyp of  colon    GERD (gastroesophageal reflux disease)    Hypertension    Tinnitus of both ears    Past Surgical History:  Procedure Laterality Date   COLONOSCOPY     KNEE ARTHROSCOPY Left 11/2020   TONSILLECTOMY AND ADENOIDECTOMY  12/19/1960   Allergies  Allergen Reactions   Other Hives and Rash   Levofloxacin Hives and Rash    Skin got like leather   Penicillins Rash   Prior to Admission medications   Medication Sig Start Date End Date Taking? Authorizing Provider  famotidine-calcium carbonate-magnesium hydroxide (PEPCID COMPLETE) [phone] MG CHEW chewable tablet Chew 1 tablet by mouth daily as needed.   Yes [provider]  lisinopril-hydrochlorothiazide (ZESTORETIC) 10-12.5 MG tablet Take 1 tablet by mouth daily. 05/19/21  Yes [name], MD  multivitamin-lutein [facility]) CAPS capsule Take 1 capsule by mouth daily.   Yes [provider]  zolpidem (AMBIEN) 5 MG tablet Take 1 tablet (5 mg total) by mouth at bedtime as needed for sleep. 09/09/20  Yes  [name], MD   Social History   Socioeconomic History   Marital status: Married    Spouse name: Not on file   Number of children: 3   Years of education: Not on file   Highest education level: Not on file  Occupational History   Occupation: sales    Employer: [employer]  Tobacco Use   Smoking status: Never   Smokeless tobacco: Never  Vaping Use   Vaping Use: Never used  Substance and Sexual Activity   Alcohol use: Yes    Alcohol/week: 14.0 standard drinks    Types: 14 Glasses of wine per week   Drug use: No   Sexual activity: Yes    Birth control/protection: None  Other Topics Concern   Not on file  Social History Narrative   Not on file   Social Determinants of Health   Financial Resource Strain: Not on file  Food Insecurity: Not on file  Transportation Needs: Not on file  Physical Activity: Not on file  Stress: Not on file  Social Connections: Not on file  Intimate Partner Violence: Not on file    Review of Systems 13 point review of systems per patient health survey noted.  Negative other than as indicated above or in HPI.    Objective:   Vitals:   10/08/21 0803  BP: 124/66  Pulse: 72  Resp: 15  Temp: 98 F (36.7 C)  TempSrc: Temporal  SpO2: 98%  Weight: 263 lb (119.3 kg)  Height: 5\' 11"  (1.803 m)     Physical Exam Vitals reviewed.  Constitutional:      Appearance: He is well-developed.     Comments: Overweight.  HENT:     Head: Normocephalic and atraumatic.     Right Ear: External ear normal.     Left Ear: External ear normal.  Eyes:     Conjunctiva/sclera: Conjunctivae normal.     Pupils: Pupils are equal, round, and reactive to light.  Neck:     Thyroid: No thyromegaly.  Cardiovascular:     Rate and Rhythm: Normal rate and regular rhythm.     Heart sounds: Normal heart sounds.  Pulmonary:     Effort: Pulmonary effort is normal. No respiratory distress.     Breath sounds: Normal breath sounds. No wheezing.  Abdominal:      General: There is no distension.     Palpations: Abdomen is soft.     Tenderness: There is  no abdominal tenderness.  Musculoskeletal:        General: No tenderness. Normal range of motion.     Cervical back: Normal range of motion and neck supple.     Comments: From of shoulders No focal calf ttp/cords.   Lymphadenopathy:     Cervical: No cervical adenopathy.  Skin:    General: Skin is warm and dry.  Neurological:     Mental Status: He is alert and oriented to person, place, and time.     Deep Tendon Reflexes: Reflexes are normal and symmetric.  Psychiatric:        Mood and Affect: Mood normal.        Behavior: Behavior normal.      Assessment & Plan:  Hartford Maulden is a 65 y.o. male . Wellness examination - Plan: Lipid panel, CBC, Hemoglobin A1c, Comprehensive metabolic panel  - - anticipatory guidance as below in AVS, screening labs if needed. Health maintenance items as above in HPI discussed/recommended as applicable.  - no concerning responses on depression, fall, or functional status screening. Any positive responses noted as above. Advanced directives discussed as in CHL.   Hyperlipidemia, unspecified hyperlipidemia type - Plan: Lipid panel, Comprehensive metabolic panel  - check labs, then possible lipid clinic eval for options.   Essential hypertension - Plan: Comprehensive metabolic panel, lisinopril-hydrochlorothiazide (ZESTORETIC) 10-12.5 MG tablet  -  Stable, tolerating current regimen. Medications refilled. Labs pending as above.   Screening for deficiency anemia - CBC  Screening for diabetes mellitus - Plan: Hemoglobin A1c, Comprehensive metabolic panel  Insomnia, unspecified type - Plan: zolpidem (AMBIEN) 5 MG tablet  - infrequent use of Ambien for intermittent symptoms - stable.   Dysesthesia  - of legs, ongoing, possible symptoms related to initial covid infection. Declines further workup/treatment at this time  Heartburn  - trigger avoidance, handout  given. Pepcid AC ok. Rtc precautions.   Arthralgia, unspecified joint  - reassuring exam. Add nighttime stretching with rtc precautions. Possible degenerative joint disease.   Screening for malignant neoplasm of prostate - Plan: PSA   Meds ordered this encounter  Medications   lisinopril-hydrochlorothiazide (ZESTORETIC) 10-12.5 MG tablet    Sig: Take 1 tablet by mouth daily.    Dispense:  90 tablet    Refill:  2   zolpidem (AMBIEN) 5 MG tablet    Sig: Take 1 tablet (5 mg total) by mouth at bedtime as needed for sleep.    Dispense:  15 tablet    Refill:  1   Patient Instructions  Try stretching before bed to see if less issues in the morning.  Pepcid AC is fine for heartburn, see foods to avoid below.  Let me know if you would like to consider meds for leg symptoms or see neuro for nerve testing.  No change in BP med today.  If cholesterol elevated, it may be worth meeting with lipid clinic or discuss with cardiology.  Thanks for coming in today.   Food Choices for Gastroesophageal Reflux Disease, Adult When you have gastroesophageal reflux disease (GERD), the foods you eat and your eating habits are very important. Choosing the right foods can help ease your discomfort. Think about working with a food expert (dietitian) to help you make good choices. What are tips for following this plan? Reading food labels Look for foods that are low in saturated fat. Foods that may help with your symptoms include: Foods that have less than 5% of daily value (DV) of fat. Foods that have  0 grams of trans fat. Cooking Do not fry your food. Cook your food by baking, steaming, grilling, or broiling. These are all methods that do not need a lot of fat for cooking. To add flavor, try to use herbs that are low in spice and acidity. Meal planning  Choose healthy foods that are low in fat, such as: Fruits and vegetables. Whole grains. Low-fat dairy products. Lean meats, fish, and poultry. Eat  small meals often instead of eating 3 large meals each day. Eat your meals slowly in a place where you are relaxed. Avoid bending over or lying down until 2-3 hours after eating. Limit high-fat foods such as fatty meats or fried foods. Limit your intake of fatty foods, such as oils, butter, and shortening. Avoid the following as told by your doctor: Foods that cause symptoms. These may be different for different people. Keep a food diary to keep track of foods that cause symptoms. Alcohol. Drinking a lot of liquid with meals. Eating meals during the 2-3 hours before bed. Lifestyle Stay at a healthy weight. Ask your doctor what weight is healthy for you. If you need to lose weight, work with your doctor to do so safely. Exercise for at least 30 minutes on 5 or more days each week, or as told by your doctor. Wear loose-fitting clothes. Do not smoke or use any products that contain nicotine or tobacco. If you need help quitting, ask your doctor. Sleep with the head of your bed higher than your feet. Use a wedge under the mattress or blocks under the bed frame to raise the head of the bed. Chew sugar-free gum after meals. What foods should eat? Eat a healthy, well-balanced diet of fruits, vegetables, whole grains, low-fat dairy products, lean meats, fish, and poultry. Each person is different. Foods that may cause symptoms in one person may not cause any symptoms in another person. Work with your doctor to find foods that are safe for you. The items listed above may not be a complete list of what you can eat and drink. Contact a food expert for more options. What foods should I avoid? Limiting some of these foods may help in managing the symptoms of GERD. Everyone is different. Talk with a food expert or your doctor to help you find the exact foods to avoid, if any. Fruits Any fruits prepared with added fat. Any fruits that cause symptoms. For some people, this may include citrus fruits, such as  oranges, grapefruit, pineapple, and lemons. Vegetables Deep-fried vegetables. [place] fries. Any vegetables prepared with added fat. Any vegetables that cause symptoms. For some people, this may include tomatoes and tomato products, chili peppers, onions and garlic, and horseradish. Grains Pastries or quick breads with added fat. Meats and other proteins High-fat meats, such as fatty beef or pork, hot dogs, ribs, ham, sausage, salami, and bacon. Fried meat or protein, including fried fish and fried chicken. Nuts and nut butters, in large amounts. Dairy Whole milk and chocolate milk. Sour cream. Cream. Ice cream. Cream cheese. Milkshakes. Fats and oils Butter. Margarine. Shortening. Ghee. Beverages Coffee and tea, with or without caffeine. Carbonated beverages. Sodas. Energy drinks. Fruit juice made with acidic fruits, such as orange or grapefruit. Tomato juice. Alcoholic drinks. Sweets and desserts Chocolate and cocoa. Donuts. Seasonings and condiments Pepper. Peppermint and spearmint. Added salt. Any condiments, herbs, or seasonings that cause symptoms. For some people, this may include curry, hot sauce, or vinegar-based salad dressings. The items listed above may not  be a complete list of what you should not eat and drink. Contact a food expert for more options. Questions to ask your doctor Diet and lifestyle changes are often the first steps that are taken to manage symptoms of GERD. If diet and lifestyle changes do not help, talk with your doctor about taking medicines. Where to find more information International Foundation for Gastrointestinal Disorders: aboutgerd.org Summary When you have GERD, food and lifestyle choices are very important in easing your symptoms. Eat small meals often instead of 3 large meals a day. Eat your meals slowly and in a place where you are relaxed. Avoid bending over or lying down until 2-3 hours after eating. Limit high-fat foods such as fatty meats or  fried foods. This information is not intended to replace advice given to you by your health care provider. Make sure you discuss any questions you have with your health care provider. Document Revised: 06/15/2020 Document Reviewed: 06/15/2020 Elsevier Patient Education  [place] 65 Years and Older, Male Preventive care refers to lifestyle choices and visits with your health care provider that can promote health and wellness. This includes: A yearly physical exam. This is also called an annual wellness visit. Regular dental and eye exams. Immunizations. Screening for certain conditions. Healthy lifestyle choices, such as: Eating a healthy diet. Getting regular exercise. Not using drugs or products that contain nicotine and tobacco. Limiting alcohol use. What can I expect for my preventive care visit? Physical exam Your health care provider will check your: Height and weight. These may be used to calculate your BMI (body mass index). BMI is a measurement that tells if you are at a healthy weight. Heart rate and blood pressure. Body temperature. Skin for abnormal spots. Counseling Your health care provider may ask you questions about your: Past medical problems. Family's medical history. Alcohol, tobacco, and drug use. Emotional well-being. Home life and relationship well-being. Sexual activity. Diet, exercise, and sleep habits. History of falls. Memory and ability to understand (cognition). Work and work Statistician. Access to firearms. What immunizations do I need? Vaccines are usually given at various ages, according to a schedule. Your health care provider will recommend vaccines for you based on your age, medical history, and lifestyle or other factors, such as travel or where you work. What tests do I need? Blood tests Lipid and cholesterol levels. These may be checked every 5 years, or more often depending on your overall health. Hepatitis C  test. Hepatitis B test. Screening Lung cancer screening. You may have this screening every year starting at age 35 if you have a 30-pack-year history of smoking and currently smoke or have quit within the past 15 years. Colorectal cancer screening. All adults should have this screening starting at age 11 and continuing until age 35. Your health care provider may recommend screening at age 20 if you are at increased risk. You will have tests every 1-10 years, depending on your results and the type of screening test. Prostate cancer screening. Recommendations will vary depending on your family history and other risks. Genital exam to check for testicular cancer or hernias. Diabetes screening. This is done by checking your blood sugar (glucose) after you have not eaten for a while (fasting). You may have this done every 1-3 years. Abdominal aortic aneurysm (AAA) screening. You may need this if you are a current or former smoker. STD (sexually transmitted disease) testing, if you are at risk. Follow these instructions at home: Eating  and drinking  Eat a diet that includes fresh fruits and vegetables, whole grains, lean protein, and low-fat dairy products. Limit your intake of foods with high amounts of sugar, saturated fats, and salt. Take vitamin and mineral supplements as recommended by your health care provider. Do not drink alcohol if your health care provider tells you not to drink. If you drink alcohol: Limit how much you have to 0-2 drinks a day. Be aware of how much alcohol is in your drink. In the [place] one drink equals one 12 oz bottle of beer (355 mL), one 5 oz glass of wine (148 mL), or one 1 oz glass of hard liquor (44 mL). Lifestyle Take daily care of your teeth and gums. Brush your teeth every morning and night with fluoride toothpaste. Floss one time each day. Stay active. Exercise for at least 30 minutes 5 or more days each week. Do not use any products that contain nicotine  or tobacco, such as cigarettes, e-cigarettes, and chewing tobacco. If you need help quitting, ask your health care provider. Do not use drugs. If you are sexually active, practice safe sex. Use a condom or other form of protection to prevent STIs (sexually transmitted infections). Talk with your health care provider about taking a low-dose aspirin or statin. Find healthy ways to cope with stress, such as: Meditation, yoga, or listening to music. Journaling. Talking to a trusted person. Spending time with friends and family. Safety Always wear your seat belt while driving or riding in a vehicle. Do not drive: If you have been drinking alcohol. Do not ride with someone who has been drinking. When you are tired or distracted. While texting. Wear a helmet and other protective equipment during sports activities. If you have firearms in your house, make sure you follow all gun safety procedures. What's next? Visit your health care provider once a year for an annual wellness visit. Ask your health care provider how often you should have your eyes and teeth checked. Stay up to date on all vaccines. This information is not intended to replace advice given to you by your health care provider. Make sure you discuss any questions you have with your health care provider. Document Revised: 02/12/2021 Document Reviewed: 11/29/2018 Elsevier Patient Education  2022 [place],   Merri Ray, MD [place] 10/08/21 8:56 AM

## 2021-10-19 ENCOUNTER — Other Ambulatory Visit: Payer: Self-pay | Admitting: Family Medicine

## 2021-10-19 DIAGNOSIS — D696 Thrombocytopenia, unspecified: Secondary | ICD-10-CM

## 2021-10-26 ENCOUNTER — Encounter: Payer: Self-pay | Admitting: Family Medicine

## 2021-10-26 ENCOUNTER — Other Ambulatory Visit: Payer: Self-pay | Admitting: Family Medicine

## 2021-10-26 DIAGNOSIS — M2550 Pain in unspecified joint: Secondary | ICD-10-CM

## 2021-10-26 NOTE — Telephone Encounter (Signed)
See message.

## 2021-11-02 ENCOUNTER — Encounter: Payer: Self-pay | Admitting: Physical Medicine and Rehabilitation

## 2021-11-03 NOTE — Progress Notes (Signed)
Cardiology Office Note:    Date:  11/08/2021   ID:  Warren Hill, DOB November 17, 1956, MRN [id number]  PCP:  [name], MD  Cardiologist:  Elouise Munroe, MD  Electrophysiologist:  None   Referring MD: [name], MD   Chief Complaint/Reason for Referral: Follow up bilateral leg pain, HLD, HTN, DOE  History of Present Illness:    Warren Hill is a 64 y.o. male with a history of HTN, GERD, HLD who presents for follow up of DOE and HTN. Left knee surgery done 11/24/20. Needed some antibiotics afterward for some incisional bleeding.   He is working but is retiring at the end of December. He recently spoke with a Music therapist and discussed his new insurance plan. Due to the plan changes to Medicare, he had to undergo several blood tests. He was worried about his platelets being low at 141,000. We discussed that this is mild. His LDL was elevated at 104 mg/dL and his triglycerides 200. We discussed cholesterol management in detail today including statins, PCSK9I and inclisiran.   Since his last visit, he has been told he has macular degeneration, and he is concerned about this being associated with cardiovascular disease. His aunt and father also had macular degeneration. We discussed possible etiologies however I disclosed that I am unaware of strong 1:1 relationship however this is outside the scope of my expertise.   He received a blood pressure cuff from his insurance and plans to record his blood pressure at home. He has never taken a cholesterol medicine. We discussed dietary modifications primarily given only mild elevations in cholesterol. He consumes cheese but notes that he uses olive oil. He minimizes eating sweets. He wants to try improving his diet and exercise. He believes he has long COVID.  He is looking forward to his 3 sons are coming home for the holidays.  The patient denies chest pain, chest pressure, dyspnea at rest or with exertion, PND, orthopnea, or leg  swelling. Denies cough, fever, chills, nausea, or vomiting. Denies syncope, presyncope, or snoring. Denies dizziness or lightheadedness.   Prior visits: When he had COVID, it started with leg pain - felt like ran a marathon. Right leg was swollen. DVT u/s has been negative x 3. Nov 27, 2019 had COVID. No cough, no CP associated. Has not had respiratory symptoms.   He has 3 sons, one in special forces, one who is an Therapist, sports, and one who is a missionary with a coffee company.  At his last visit, he continued to have exertional shortness of breath that he attributed to age and being out of shape. He was walking more and recognized he is deconditioned. He reported his "shin-splint" pains have resolved.   His blood test was 05/19/2021. He was not taking cholesterol medication, and LDL and triglycerides have been high previously. We discussed the option of statin therapy, and patient inquired about using injectable medications for cholesterol management.  We discussed PCSK9 inhibitors.  In his family, his mother and father both had hypertension and hyperlipidemia.  Past Medical History:  Diagnosis Date   Adenomatous polyp of colon    GERD (gastroesophageal reflux disease)    Hypertension    Tinnitus of both ears     Past Surgical History:  Procedure Laterality Date   COLONOSCOPY     KNEE ARTHROSCOPY Left 11/2020   TONSILLECTOMY AND ADENOIDECTOMY  12/19/1960    Current Medications: Current Meds  Medication Sig   famotidine-calcium carbonate-magnesium hydroxide (PEPCID COMPLETE) [phone]  MG CHEW chewable tablet Chew 1 tablet by mouth daily as needed.   lisinopril-hydrochlorothiazide (ZESTORETIC) 10-12.5 MG tablet Take 1 tablet by mouth daily.   multivitamin-lutein (OCUVITE-LUTEIN) CAPS capsule Take 1 capsule by mouth daily.   Current Facility-Administered Medications for the 11/08/21 encounter (Office Visit) with Elouise Munroe, MD  Medication   0.9 %  sodium chloride infusion      Allergies:   Other, Levofloxacin, and Penicillins   Social History   Tobacco Use   Smoking status: Never   Smokeless tobacco: Never  Vaping Use   Vaping Use: Never used  Substance Use Topics   Alcohol use: Yes    Alcohol/week: 14.0 standard drinks    Types: 14 Glasses of wine per week   Drug use: No     Family History: The patient's family history includes Colon cancer in his maternal grandmother; Colon polyps in his brother, father, and mother; Diabetes in his paternal grandmother; Hypertension in his father and mother; Liver disease in his paternal uncle; Pancreatic cancer in his paternal uncle.  ROS:   Please see the history of present illness.    All other systems reviewed and are negative.  EKGs/Labs/Other Studies Reviewed:    The following studies were reviewed today: [name] LE Doppler 11/02/2020: Right: Resting right ankle-brachial index indicates noncompressible right  lower extremity arteries. The right toe-brachial index is normal.  Left: Resting left ankle-brachial index is within normal range. No  evidence of significant left lower extremity arterial disease. The left  toe-brachial index is normal.   Echo 10/26/2020: 1. Left ventricular ejection fraction, by estimation, is 60 to 65%. The  left ventricle has normal function. The left ventricle has no regional  wall motion abnormalities. There is mild left ventricular hypertrophy.  Left ventricular diastolic parameters  were normal.   2. Right ventricular systolic function is normal. The right ventricular  size is mildly enlarged. There is normal pulmonary artery systolic  pressure. The estimated right ventricular systolic pressure is 31.5 mmHg.   3. The mitral valve is normal in structure. No evidence of mitral valve  regurgitation.   4. The aortic valve was not well visualized. Aortic valve regurgitation  is trivial. No aortic stenosis is present.   5. Aortic dilatation noted. There is mild dilatation of the  ascending  aorta, measuring 39 mm.   6. The inferior vena cava is normal in size with greater than 50%  respiratory variability, suggesting right atrial pressure of 3 mmHg.   [name] LE Venous Reflux 06/26/2020: Right:  - No evidence of deep vein thrombosis seen in the right lower extremity,  from the common femoral through the popliteal veins.  - No evidence of superficial venous thrombosis in the right lower  extremity.  - Deep vein reflux in the CFV.  - Superficial vein reflux in the SSV, and GSV as above.  Left:  - No evidence of deep vein thrombosis seen in the left lower extremity,  from the common femoral through the popliteal veins.  - No evidence of superficial venous thrombosis in the left lower  extremity.  - Deep vein reflux in the CFV.  - Superficial vein reflux in the SSV, SFJ, and GSV as above.   EKG: 11/08/21: NSR, rate 74 bpm 06/04/2021: NSR, rate 71 bpm 10/08/2020: NSR, rate 68 bpm  Recent Labs: 10/08/2021: ALT 31; BUN 11; Creatinine, Ser 0.89; Hemoglobin 15.1; Platelets 141.0; Potassium 4.3; Sodium 137  Recent Lipid Panel    Component Value Date/Time  CHOL 187 10/08/2021 0859   CHOL 200 (H) 09/09/2020 1442   TRIG 200.0 (H) 10/08/2021 0859   HDL 42.40 10/08/2021 0859   HDL 50 09/09/2020 1442   CHOLHDL 4 10/08/2021 0859   VLDL 40.0 10/08/2021 0859   LDLCALC 104 (H) 10/08/2021 0859   LDLCALC 112 (H) 09/09/2020 1442    Physical Exam:    VS:  BP 106/72 (BP Location: Left Arm, Patient Position: Sitting, Cuff Size: Large)   Pulse 74   Ht 5\' 11"  (1.803 m)   Wt 263 lb 6.4 oz (119.5 kg)   SpO2 99%   BMI 36.74 kg/m     Wt Readings from Last 5 Encounters:  11/08/21 263 lb 6.4 oz (119.5 kg)  10/08/21 263 lb (119.3 kg)  06/04/21 267 lb 12.8 oz (121.5 kg)  05/19/21 265 lb 6.4 oz (120.4 kg)  11/30/20 258 lb (117 kg)    Constitutional: No acute distress Eyes: sclera non-icteric, normal conjunctiva and lids ENMT: normal dentition, moist mucous  membranes Cardiovascular: regular rhythm, normal rate, no murmurs. S1 and S2 normal. Radial pulses normal bilaterally. No jugular venous distention.  Respiratory: clear to auscultation bilaterally GI : normal bowel sounds, soft and nontender. No distention.   MSK: extremities warm, well perfused. No edema.  NEURO: grossly nonfocal exam, moves all extremities. PSYCH: alert and oriented x 3, normal mood and affect.   ASSESSMENT:    1. Hypertension, unspecified type   2. Dilation of aorta (HCC)   3. Mixed hyperlipidemia   4. Bilateral leg pain     PLAN:    Bilateral leg pain - LE arterial studies negative.  Pain continues but has been thoroughly investigated. He will continue to monitor.  Hypertension, unspecified type - BP well controlled on current therapy, continue lisinopril HCTZ.   Mixed hyperlipidemia - Plan: Lipid panel -LDL and trig above goal, LDL is 104 and Trig 200. Not currently on a statin. He would like to try diet and lifestyle first and if still elevated at next check, would start statin. Could follow up with [facility] lipid clinic while I am out of office since he is interested in his eligibility for PCSK9I, however we discussed that most patients require first line therapy with statins prior to pursing the injectable cholesterol therapies. Labs in 3 mo, follow up with me in 6 mo.  - consider Ct coronary calcium score next year per patient preference if lipids still elevated to help determine need for statin.   Total time of encounter: 30 minutes total time of encounter, including 25 minutes spent in face-to-face patient care on the date of this encounter. This time includes coordination of care and counseling regarding above mentioned problem list. Remainder of non-face-to-face time involved reviewing chart documents/testing relevant to the patient encounter and documentation in the medical record. I have independently reviewed documentation from referring provider.   Cherlynn Kaiser, MD [place]  [facility]    Medication Adjustments/Labs and Tests Ordered: Current medicines are reviewed at length with the patient today.  Concerns regarding medicines are outlined above.   Orders Placed This Encounter  Procedures   EKG 12-Lead     No orders of the defined types were placed in this encounter.   Patient Instructions  Medication Instructions:  No Changes In Medications at this time.  *If you need a refill on your cardiac medications before your next appointment, please call your pharmacy*  Lab Work: PLEASE RETURN IN 3 MONTHS FOR BLOOD WORK- YOU WILL NEED  TO FAST- NO APPOINTMENT NEEDED - LAB HOURS ARE Monday-Friday from 8am-4pm If you have labs (blood work) drawn today and your tests are completely normal, you will receive your results only by: MyChart Message (if you have MyChart) OR A paper copy in the mail If you have any lab test that is abnormal or we need to change your treatment, we will call you to review the results.  Testing/Procedures: Your physician has requested that you have an echocardiogram IN 3 MONTHS. Echocardiography is a painless test that uses sound waves to create images of your heart. It provides your doctor with information about the size and shape of your heart and how well your heart's chambers and valves are working. You may receive an ultrasound enhancing agent through an IV if needed to better visualize your heart during the echo.This procedure takes approximately one hour. There are no restrictions for this procedure. This will take place at the [address], [address].   Follow-Up: At [facility], you and your health needs are our priority.  As part of our continuing mission to provide you with exceptional heart care, we have created designated Provider Care Teams.  These Care Teams include your primary Cardiologist (physician) and Advanced Practice Providers (APPs -  Physician Assistants and Nurse Practitioners)  who all work together to provide you with the care you need, when you need it.  Your next appointment:   6 month(s)  The format for your next appointment:   In Person  Provider:   Elouise Munroe, MD     Wilhemina Bonito as a scribe for Elouise Munroe, MD.,have documented all relevant documentation on the behalf of Elouise Munroe, MD,as directed by  Elouise Munroe, MD while in the presence of Elouise Munroe, MD.  I, Elouise Munroe, MD, have reviewed all documentation for this visit. The documentation on 11/08/21 for the exam, diagnosis, procedures, and orders are all accurate and complete.

## 2021-11-08 ENCOUNTER — Ambulatory Visit: Payer: BC Managed Care – PPO | Admitting: Internal Medicine

## 2021-11-08 ENCOUNTER — Other Ambulatory Visit: Payer: Self-pay

## 2021-11-08 ENCOUNTER — Encounter: Payer: Self-pay | Admitting: Internal Medicine

## 2021-11-08 VITALS — BP 106/72 | HR 74 | Ht 71.0 in | Wt 263.4 lb

## 2021-11-08 DIAGNOSIS — I77819 Aortic ectasia, unspecified site: Secondary | ICD-10-CM | POA: Diagnosis not present

## 2021-11-08 DIAGNOSIS — I10 Essential (primary) hypertension: Secondary | ICD-10-CM

## 2021-11-08 DIAGNOSIS — M79604 Pain in right leg: Secondary | ICD-10-CM | POA: Diagnosis not present

## 2021-11-08 DIAGNOSIS — M79605 Pain in left leg: Secondary | ICD-10-CM

## 2021-11-08 DIAGNOSIS — E782 Mixed hyperlipidemia: Secondary | ICD-10-CM

## 2021-11-08 NOTE — Patient Instructions (Signed)
Medication Instructions:  No Changes In Medications at this time.  *If you need a refill on your cardiac medications before your next appointment, please call your pharmacy*  Lab Work: [place] 3 MONTHS FOR BLOOD WORK- YOU WILL NEED TO FAST- NO APPOINTMENT NEEDED - LAB HOURS ARE Monday-Friday from 8am-4pm If you have labs (blood work) drawn today and your tests are completely normal, you will receive your results only by: [place] (if you have MyChart) OR A paper copy in the mail If you have any lab test that is abnormal or we need to change your treatment, we will call you to review the results.  Testing/Procedures: Your physician has requested that you have an echocardiogram IN 3 MONTHS. Echocardiography is a painless test that uses sound waves to create images of your heart. It provides your doctor with information about the size and shape of your heart and how well your heart's chambers and valves are working. You may receive an ultrasound enhancing agent through an IV if needed to better visualize your heart during the echo.This procedure takes approximately one hour. There are no restrictions for this procedure. This will take place at the [address].   Follow-Up: At [facility], you and your health needs are our priority.  As part of our continuing mission to provide you with exceptional heart care, we have created designated Provider Care Teams.  These Care Teams include your primary Cardiologist (physician) and Advanced Practice Providers (APPs -  Physician Assistants and Nurse Practitioners) who all work together to provide you with the care you need, when you need it.  Your next appointment:   6 month(s)  The format for your next appointment:   In Person  Provider:   Elouise Munroe, MD

## 2022-01-11 ENCOUNTER — Other Ambulatory Visit: Payer: Self-pay | Admitting: Internal Medicine

## 2022-01-11 DIAGNOSIS — E782 Mixed hyperlipidemia: Secondary | ICD-10-CM

## 2022-01-11 DIAGNOSIS — I10 Essential (primary) hypertension: Secondary | ICD-10-CM

## 2022-02-04 ENCOUNTER — Encounter
Payer: Medicare Other | Attending: Physical Medicine and Rehabilitation | Admitting: Physical Medicine and Rehabilitation

## 2022-02-04 ENCOUNTER — Encounter: Payer: Self-pay | Admitting: Physical Medicine and Rehabilitation

## 2022-02-04 ENCOUNTER — Other Ambulatory Visit: Payer: Self-pay

## 2022-02-04 VITALS — BP 114/78 | HR 76 | Ht 71.0 in | Wt 263.0 lb

## 2022-02-04 DIAGNOSIS — M7989 Other specified soft tissue disorders: Secondary | ICD-10-CM | POA: Insufficient documentation

## 2022-02-04 DIAGNOSIS — M792 Neuralgia and neuritis, unspecified: Secondary | ICD-10-CM | POA: Insufficient documentation

## 2022-02-04 NOTE — Progress Notes (Signed)
Subjective:    Patient ID: Warren Hill, male    DOB: May 07, 1956, 66 y.o.   MRN: [id number]  HPI  Pt is a 66 yr old male with HTN, and GERD, tinnitus Here for evaluation of shin pain and RLE swelling.      Has been having pain since COVID.  Only calves and shins- and knees were a little swollen.  Got COVID 11/2019 right AFTER had started having pain in legs.   Had 3 Dopplers, and all were negative. Stopped Eliquis they started. Did ~ 2 weeks of it.   Shin splints have now gone away- since 4-6 months ago- got progressively better.   Never have lost the compression sock feeling.   Had [place] booster in November 2022- and pain came back and more swelling in R >L legs- RLE swelled, both hurt.   Pain has gone into toes- toes ache- started around the same time-   Comes and goes- q2-3 weeks- but just intermittent.  Main issue is still mild swelling of RLE and compression stocking feeling. And not wearing compression  Has ECHO of heart due to thinking might be cardiac-  Does have mild dilation of aorta- to get second ECHO 3/23. Also to check Calcium score in March.    No trauma, no falls. To cause this pain.  No color changes to legs/feet.  Discomfort doesn't really get worse with activity-  Can get a little sore with activity, but occurred before as well.  Still uses chain saw; no balance issues, no falling.  Has some associated muscle spasms-- usually in chest/back, not in legs usually- is new since COVID.   When Had COVID- light flu- fever, lost taste/smell- no breathing issues.  Saw Dr Para March- no arthritis in knees and ankles.  L knee- 11/21- did arthroscopy. To clean up cartilage.    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  Pain Inventory Average Pain 2 Pain Right Now 1 My pain is constant, burning, stabbing, and aching  In the last 24 hours, has pain interfered with the following? General activity 1 Relation with  others 0 Enjoyment of life 1 What TIME of day is your pain at its worst? varies Sleep (in general) Good  Pain is worse with: walking, bending, sitting, inactivity, standing, some activites, and 24/7 Pain improves with: medication Relief from Meds: 6  walk without assistance how many minutes can you walk? 60 ability to climb steps?  yes do you drive?  yes Do you have any goals in this area?  yes  retired  spasms  CT/MRI  Any changes since last visit?  no    Family History  Problem Relation Age of Onset   Colon polyps Mother    Hypertension Mother    Colon polyps Father    Hypertension Father    Pancreatic cancer Paternal Uncle    Liver disease Paternal Uncle    Colon cancer Maternal Grandmother    Colon polyps Brother    Diabetes Paternal Grandmother    Social History   Socioeconomic History   Marital status: Married    Spouse name: Not on file   Number of children: 3   Years of education: Not on file   Highest education level: Not on file  Occupational History   Occupation: Scientist, clinical (histocompatibility and immunogenetics): [employer]  Tobacco Use   Smoking status: Never   Smokeless tobacco: Never  Vaping Use   Vaping Use: Never used  Substance and Sexual Activity   Alcohol use: Yes    Alcohol/week: 14.0 standard drinks    Types: 14 Glasses of wine per week   Drug use: No   Sexual activity: Yes    Birth control/protection: None  Other Topics Concern   Not on file  Social History Narrative   Not on file   Social Determinants of Health   Financial Resource Strain: Not on file  Food Insecurity: Not on file  Transportation Needs: Not on file  Physical Activity: Not on file  Stress: Not on file  Social Connections: Not on file   Past Surgical History:  Procedure Laterality Date   COLONOSCOPY     KNEE ARTHROSCOPY Left 11/2020   TONSILLECTOMY AND ADENOIDECTOMY  12/19/1960   Past Medical History:  Diagnosis Date   Adenomatous polyp of colon    GERD (gastroesophageal reflux  disease)    Hypertension    Tinnitus of both ears    BP 114/78    Pulse 76    Ht 5\' 11"  (1.803 m)    Wt 263 lb (119.3 kg)    SpO2 96%    BMI 36.68 kg/m   Opioid Risk Score:   Fall Risk Score:  `1  Depression screen PHQ 2/9  Depression screen [facility] 2/9 02/04/2022 10/08/2021 05/19/2021 09/09/2020 07/30/2020 07/20/2020 06/03/2020  Decreased Interest 0 1 1 0 0 0 0  Down, Depressed, Hopeless 0 0 1 0 0 0 0  PHQ - 2 Score 0 1 2 0 0 0 0  Altered sleeping 1 2 1  - - - -  Tired, decreased energy 1 2 1  - - - -  Change in appetite 0 0 1 - - - -  Feeling bad or failure about yourself  0 0 0 - - - -  Trouble concentrating 0 0 0 - - - -  Moving slowly or fidgety/restless 0 0 0 - - - -  Suicidal thoughts 0 0 0 - - - -  PHQ-9 Score 2 5 5  - - - -  Difficult doing work/chores Somewhat difficult - - - - - -     Review of Systems  Constitutional: Negative.   HENT: Negative.    Eyes: Negative.   Respiratory: Negative.    Cardiovascular:  Positive for leg swelling.  Gastrointestinal: Negative.   Endocrine: Negative.   Genitourinary: Negative.   Musculoskeletal: Negative.   Skin: Negative.   Allergic/Immunologic: Negative.   Neurological: Negative.   Hematological: Negative.   Psychiatric/Behavioral: Negative.        Objective:   Physical Exam Awake, alert, appropriate, sitting on table, NAD R Calf size 18 1/4 inches; L calf size- both at largest point 17 1/4 inches- so 1 inch difference  Neuro Decreased to light touch in L3/L4/ dermatomes on R only- but same in L1,2 and L5 dermatomes  MS: HF/KE/KF DF and PF B/L  5/5 Palpable trigger points in gastrocs.       Assessment & Plan:   Pt is a 66 yr old R handed  male with HTN, and GERD, tinnitus Here for evaluation of shin pain and RLE swelling.    Suggest foam roller or rolling pin- roll up towards heart- no more than 15 minutes/day- honestly it works better if wife can help. Thinks it's related to his COVID and have physical evidence of  Sx's- with 1 inch difference swelling in R calf over L calf.  If pain gets worse again and rolling pin doesn't work; can try  low dose gabapentin. 200-300 mg nightly=- max 2x/day. Would only do if pain comes back.  4 .  I can prescribe for up to 6 months after this appointment, but PCP would need to after that.   5. F/U prn  I spent a total of  34  minutes on total care today- >50% coordination of care- due to education on treatment and full exam/history taking.

## 2022-02-04 NOTE — Patient Instructions (Signed)
Pt is a 66 yr old R handed  male with HTN, and GERD, tinnitus Here for evaluation of shin pain and RLE swelling.    Suggest foam roller or rolling pin- roll up towards heart- no more than 15 minutes/day- honestly it works better if wife can help. Thinks it's related to his COVID and have physical evidence of Sx's- with 1 inch difference swelling in R calf over L calf.  If pain gets worse again and rolling pin doesn't work; can try low dose gabapentin. 200-300 mg nightly=- max 2x/day. Would only do if pain comes back.  4 .  I can prescribe for up to 6 months after this appointment, but PCP would need to after that.   5. F/U prn

## 2022-02-23 ENCOUNTER — Other Ambulatory Visit: Payer: Self-pay

## 2022-02-23 ENCOUNTER — Ambulatory Visit (INDEPENDENT_AMBULATORY_CARE_PROVIDER_SITE_OTHER)
Admission: RE | Admit: 2022-02-23 | Discharge: 2022-02-23 | Disposition: A | Discharge status: Home / Self Care | Payer: Self-pay | Source: Ambulatory Visit | Attending: Internal Medicine | Admitting: Internal Medicine

## 2022-02-23 ENCOUNTER — Ambulatory Visit (HOSPITAL_COMMUNITY): Payer: Medicare Other | Attending: Cardiovascular Disease

## 2022-02-23 DIAGNOSIS — E785 Hyperlipidemia, unspecified: Secondary | ICD-10-CM | POA: Insufficient documentation

## 2022-02-23 DIAGNOSIS — E669 Obesity, unspecified: Secondary | ICD-10-CM | POA: Insufficient documentation

## 2022-02-23 DIAGNOSIS — I10 Essential (primary) hypertension: Secondary | ICD-10-CM

## 2022-02-23 DIAGNOSIS — I77819 Aortic ectasia, unspecified site: Secondary | ICD-10-CM | POA: Diagnosis not present

## 2022-02-23 DIAGNOSIS — E782 Mixed hyperlipidemia: Secondary | ICD-10-CM

## 2022-02-23 LAB — ECHOCARDIOGRAM COMPLETE
Area-P 1/2: 2.91 cm2
S' Lateral: 3.2 cm

## 2022-03-01 ENCOUNTER — Other Ambulatory Visit: Payer: Self-pay

## 2022-03-01 DIAGNOSIS — E782 Mixed hyperlipidemia: Secondary | ICD-10-CM

## 2022-03-01 DIAGNOSIS — R931 Abnormal findings on diagnostic imaging of heart and coronary circulation: Secondary | ICD-10-CM

## 2022-03-08 ENCOUNTER — Other Ambulatory Visit: Payer: Self-pay

## 2022-03-08 DIAGNOSIS — R931 Abnormal findings on diagnostic imaging of heart and coronary circulation: Secondary | ICD-10-CM | POA: Diagnosis not present

## 2022-03-08 DIAGNOSIS — E782 Mixed hyperlipidemia: Secondary | ICD-10-CM

## 2022-03-09 LAB — COMPREHENSIVE METABOLIC PANEL
ALT: 21 IU/L (ref 0–44)
AST: 21 IU/L (ref 0–40)
Albumin/Globulin Ratio: 1.8 (ref 1.2–2.2)
Albumin: 4.7 g/dL (ref 3.8–4.8)
Alkaline Phosphatase: 68 IU/L (ref 44–121)
BUN/Creatinine Ratio: 14 (ref 10–24)
BUN: 12 mg/dL (ref 8–27)
Bilirubin Total: 1 mg/dL (ref 0.0–1.2)
CO2: 24 mmol/L (ref 20–29)
Calcium: 9.4 mg/dL (ref 8.6–10.2)
Chloride: 102 mmol/L (ref 96–106)
Creatinine, Ser: 0.88 mg/dL (ref 0.76–1.27)
Globulin, Total: 2.6 g/dL (ref 1.5–4.5)
Glucose: 87 mg/dL (ref 70–99)
Potassium: 4.4 mmol/L (ref 3.5–5.2)
Sodium: 142 mmol/L (ref 134–144)
Total Protein: 7.3 g/dL (ref 6.0–8.5)
eGFR: 95 mL/min/{1.73_m2} (ref >59)

## 2022-03-09 LAB — LIPID PANEL
Chol/HDL Ratio: 4 ratio (ref 0.0–5.0)
Cholesterol, Total: 183 mg/dL (ref 100–199)
HDL: 46 mg/dL (ref >39)
LDL Chol Calc (NIH): 110 mg/dL — ABNORMAL HIGH (ref 0–99)
Triglycerides: 154 mg/dL — ABNORMAL HIGH (ref 0–149)
VLDL Cholesterol Cal: 27 mg/dL (ref 5–40)

## 2022-03-09 LAB — APOLIPOPROTEIN B: Apolipoprotein B: 105 mg/dL — ABNORMAL HIGH (ref <90)

## 2022-03-09 LAB — LIPOPROTEIN A (LPA): Lipoprotein (a): <8.4 nmol/L

## 2022-04-11 ENCOUNTER — Ambulatory Visit (INDEPENDENT_AMBULATORY_CARE_PROVIDER_SITE_OTHER): Payer: Medicare Other | Admitting: Family Medicine

## 2022-04-11 ENCOUNTER — Encounter: Payer: Self-pay | Admitting: Family Medicine

## 2022-04-11 VITALS — BP 116/78 | HR 68 | Temp 97.9°F | Resp 16 | Ht 71.0 in | Wt 262.6 lb

## 2022-04-11 DIAGNOSIS — I10 Essential (primary) hypertension: Secondary | ICD-10-CM

## 2022-04-11 DIAGNOSIS — Z23 Encounter for immunization: Secondary | ICD-10-CM | POA: Diagnosis not present

## 2022-04-11 DIAGNOSIS — M2550 Pain in unspecified joint: Secondary | ICD-10-CM

## 2022-04-11 DIAGNOSIS — E785 Hyperlipidemia, unspecified: Secondary | ICD-10-CM

## 2022-04-11 DIAGNOSIS — G4700 Insomnia, unspecified: Secondary | ICD-10-CM | POA: Diagnosis not present

## 2022-04-11 MED ORDER — ZOLPIDEM TARTRATE 5 MG PO TABS: 5.0000 mg | ORAL_TABLET | Freq: Every evening | ORAL | 1 refills | Status: DC | PRN

## 2022-04-11 MED ORDER — LISINOPRIL-HYDROCHLOROTHIAZIDE 10-12.5 MG PO TABS: 1.0000 | ORAL_TABLET | Freq: Every day | ORAL | 2 refills | Status: DC

## 2022-04-11 NOTE — Patient Instructions (Addendum)
No med changes today.  Foam roller and stretching may be helpful.  Watch for any new bleeding on the aspirin.  Let me know if we need to discuss that further.  I did send a refill of the Ambien when needed.  Continue same dose blood pressure medicine for now.  Follow-up with cardiology as planned for discussion of statin medication.  Follow-up with me in 6 months for physical but let me know if there are any questions or new symptoms during that time. ? ?

## 2022-04-11 NOTE — Progress Notes (Signed)
? ?Subjective:  ?Patient ID: Warren Hill, male    DOB: 08-Feb-1956  Age: 66 y.o. MRN: [id number] ? ?CC:  ?Chief Complaint  ?Patient presents with  ? Insomnia  ?  Pt reports has not had to take [place] notes has about 15 tabs at home but has been doing well  ? Hypertension  ?  Pt notes some light headaches but unsure if related, notes no other sxs does not take BP at home   ? Hyperlipidemia  ?  Due for recheck, borderline last visit   ? ? ?HPI ?[name] presents for follow up.  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  ?Son Myriam Jacobson will be getting married next year, his fiancee done with travel nursing. ? ? ?Insomnia ?Intermittent, usually associated with travel previously.  Has Ambien, no recent need. ?Traveling to [place] in September.  ? ?Calf pain/swelling: ?See prior notes and workup. Saw PMR on 02/04/22. Trigger points? Foam roller/massage recommended - using massage chair and ASA. Option of gabapentin - not needed.  Controlled with ASA BID ('81mg'$ ). Considering PT.  ? ?Hypertension: ?Lisinopril HCTZ 10/12.5 mg daily. ?No Home readings.  Occasional slight headache but unsure if related to blood pressure. Every few weeks, resolves, no worsening, resolves with ASA once.  ?Taking ASA '81mg'$  BID.  ?Some fatigue over the years. No acute changes.  ?BP Readings from Last 3 Encounters:  ?04/11/22 116/78  ?02/04/22 114/78  ?11/08/21 106/72  ? ?Lab Results  ?Component Value Date  ? CREATININE 0.88 03/08/2022  ? ?Hyperlipidemia: ?Has been followed by cardiology, Dr. Margaretann Loveless.  ?Note reviewed from November.  Diet/exercise approach planned initially.  Triglycerides 200, LDL 104 in October 2022.  Echo March 8, no worrisome findings.  Aorta measurements were borderline abnormal, plan for discussion at follow-up.  Coronary calcium scoring on March 8 with reading of 76, 40th percentile.  Elevated readings noted in the LAD, left circumflex. ?Normal lipoprotein a, ApoB 105. ?Appointment with cardiology May 8th.  Considering statin at that time.  ?The 10-year ASCVD risk score (Arnett DK, et al., 2019) is: 13.3% ?  Values used to calculate the score: ?    Age: 41 years ?    Sex: Male ?    Is Non-Hispanic African American: No ?    Diabetic: No ?    Tobacco smoker: No ?    Systolic Blood Pressure: 409 mmHg ?    Is BP treated: Yes ?    HDL Cholesterol: 46 mg/dL ?    Total Cholesterol: 183 mg/dL ? ? ?Lab Results  ?Component Value Date  ? CHOL 183 03/08/2022  ? HDL 46 03/08/2022  ? LDLCALC 110 (H) 03/08/2022  ? TRIG 154 (H) 03/08/2022  ? CHOLHDL 4.0 03/08/2022  ? ?Lab Results  ?Component Value Date  ? ALT 21 03/08/2022  ? AST 21 03/08/2022  ? ALKPHOS 68 03/08/2022  ? BILITOT 1.0 03/08/2022  ? ?HM: ?Prevnar 20 today.  ?Had bivalent covid vaccine last year.  ?Td recommended at pharmacy.  ? ? ?History ?Patient Active Problem List  ? Diagnosis Date Noted  ? Calf swelling 02/04/2022  ? Nerve pain 02/04/2022  ? HTN (hypertension) 01/18/2012  ? Insomnia 01/18/2012  ? Tinnitus 01/18/2012  ? GERD 05/28/2008  ? COLITIS 05/28/2008  ? COLONIC POLYPS, ADENOMATOUS, HX OF 05/28/2008  ? ?Past Medical History:  ?Diagnosis Date  ? Adenomatous polyp of colon   ? GERD (gastroesophageal reflux disease)   ? Hypertension   ? Tinnitus of both ears   ? ?  Past Surgical History:  ?Procedure Laterality Date  ? COLONOSCOPY    ? KNEE ARTHROSCOPY Left 11/2020  ? TONSILLECTOMY AND ADENOIDECTOMY  12/19/1960  ? ?Allergies  ?Allergen Reactions  ? Other Hives and Rash  ? Levofloxacin Hives and Rash  ?  Skin got like leather  ? Penicillins Rash  ? ?Prior to Admission medications   ?Medication Sig Start Date End Date Taking? Authorizing Provider  ?aspirin 81 MG EC tablet Take 81 mg by mouth daily. Swallow whole.   Yes [provider]  ?famotidine-calcium carbonate-magnesium hydroxide (PEPCID COMPLETE) 10-800-165 MG CHEW chewable tablet Chew 1 tablet by mouth daily as needed.   Yes [provider]  ?lisinopril-hydrochlorothiazide (ZESTORETIC) 10-12.5  MG tablet Take 1 tablet by mouth daily. 10/08/21  Yes [name], MD  ?multivitamin-lutein [facility]) CAPS capsule Take 2 capsules by mouth daily.   Yes [provider]  ?zolpidem (AMBIEN) 5 MG tablet Take 1 tablet (5 mg total) by mouth at bedtime as needed for sleep. 10/08/21  Yes [name], MD  ?hydrocortisone 2.5 % cream Apply 1 application topically 2 (two) times daily as needed. ?Patient not taking: Reported on 11/08/2021 08/16/21   [provider]  ? ?Social History  ? ?Socioeconomic History  ? Marital status: Married  ?  Spouse name: Not on file  ? Number of children: 3  ? Years of education: Not on file  ? Highest education level: Not on file  ?Occupational History  ? Occupation: Press photographer  ?  Employer: [employer]  ?Tobacco Use  ? Smoking status: Never  ? Smokeless tobacco: Never  ?Vaping Use  ? Vaping Use: Never used  ?Substance and Sexual Activity  ? Alcohol use: Yes  ?  Alcohol/week: 14.0 standard drinks  ?  Types: 14 Glasses of wine per week  ? Drug use: No  ? Sexual activity: Yes  ?  Birth control/protection: None  ?Other Topics Concern  ? Not on file  ?Social History Narrative  ? Not on file  ? ?Social Determinants of Health  ? ?Financial Resource Strain: Not on file  ?Food Insecurity: Not on file  ?Transportation Needs: Not on file  ?Physical Activity: Not on file  ?Stress: Not on file  ?Social Connections: Not on file  ?Intimate Partner Violence: Not on file  ? ? ?Review of Systems  ?Constitutional:  Negative for fatigue (as older. no acute changes.) and unexpected weight change.  ?Eyes:  Negative for visual disturbance.  ?Respiratory:  Negative for cough, chest tightness and shortness of breath.   ?Cardiovascular:  Negative for chest pain, palpitations and leg swelling.  ?Gastrointestinal:  Negative for abdominal pain and blood in stool.  ?Neurological:  Positive for headaches (rare, fleeting.). Negative for dizziness and light-headedness.  ? ? ?Objective:   ? ?Vitals:  ? 04/11/22 0840  ?BP: 116/78  ?Pulse: 68  ?Resp: 16  ?Temp: 97.9 ?F (36.6 ?C)  ?TempSrc: Temporal  ?SpO2: 98%  ?Weight: 262 lb 9.6 oz (119.1 kg)  ?Height: '5\' 11"'$  (1.803 m)  ? ? ? ?Physical Exam ?Vitals reviewed.  ?Constitutional:   ?   Appearance: He is well-developed.  ?HENT:  ?   Head: Normocephalic and atraumatic.  ?Neck:  ?   Vascular: No carotid bruit or JVD.  ?Cardiovascular:  ?   Rate and Rhythm: Normal rate and regular rhythm.  ?   Heart sounds: Normal heart sounds. No murmur heard. ?Pulmonary:  ?   Effort: Pulmonary effort is normal.  ?  Breath sounds: Normal breath sounds. No rales.  ?Musculoskeletal:  ?   Right lower leg: No edema.  ?   Left lower leg: No edema.  ?Skin: ?   General: Skin is warm and dry.  ?Neurological:  ?   General: No focal deficit present.  ?   Mental Status: He is alert and oriented to person, place, and time. Mental status is at baseline.  ?Psychiatric:     ?   Mood and Affect: Mood normal.     ?   Behavior: Behavior normal.  ? ? ? ? ? ?Assessment & Plan:  ?Shylo Dillenbeck is a 66 y.o. male . ?Hyperlipidemia, unspecified hyperlipidemia type ? -With elevated coronary calcium score, apolipoprotein B.  Has follow-up May 8 with cardiology to discuss low-dose statin.  Recent labs noted. ? ?Essential hypertension - Plan: lisinopril-hydrochlorothiazide (ZESTORETIC) 10-12.5 MG tablet ? -Well-controlled, continue same regimen.  Fleeting headaches.  RTC precautions if more recurrent or worsening. ? ?Insomnia, unspecified type - Plan: zolpidem (AMBIEN) 5 MG tablet ? -Stable, intermittent with travel primarily.  Ambien refilled if needed. ? ?Need for pneumococcal vaccination - Plan: Pneumococcal conjugate vaccine 20-valent (Prevnar 20) ? ?Calf pain/arthralgia.  Seen by physical medicine and rehab as above.  Potentially related to Prior COVID infection.  Continue massage, foam roller discussed per her note.  Aspirin low-dose has been helpful for now.  Considering physical therapy.  RTC  precautions.  Decided against gabapentin. ? ?Meds ordered this encounter  ?Medications  ? lisinopril-hydrochlorothiazide (ZESTORETIC) 10-12.5 MG tablet  ?  Sig: Take 1 tablet by mouth daily.  ?  Dispense:  90 tablet

## 2022-04-13 DIAGNOSIS — H2513 Age-related nuclear cataract, bilateral: Secondary | ICD-10-CM | POA: Diagnosis not present

## 2022-04-13 DIAGNOSIS — H353132 Nonexudative age-related macular degeneration, bilateral, intermediate dry stage: Secondary | ICD-10-CM | POA: Diagnosis not present

## 2022-04-13 DIAGNOSIS — H02834 Dermatochalasis of left upper eyelid: Secondary | ICD-10-CM | POA: Diagnosis not present

## 2022-04-13 DIAGNOSIS — H02831 Dermatochalasis of right upper eyelid: Secondary | ICD-10-CM | POA: Diagnosis not present

## 2022-04-25 ENCOUNTER — Encounter: Payer: Self-pay | Admitting: Internal Medicine

## 2022-04-25 ENCOUNTER — Ambulatory Visit: Payer: Medicare Other | Admitting: Internal Medicine

## 2022-04-25 VITALS — BP 118/76 | HR 64 | Ht 71.0 in | Wt 262.2 lb

## 2022-04-25 DIAGNOSIS — R0609 Other forms of dyspnea: Secondary | ICD-10-CM | POA: Diagnosis not present

## 2022-04-25 DIAGNOSIS — I10 Essential (primary) hypertension: Secondary | ICD-10-CM

## 2022-04-25 DIAGNOSIS — I77819 Aortic ectasia, unspecified site: Secondary | ICD-10-CM

## 2022-04-25 DIAGNOSIS — E782 Mixed hyperlipidemia: Secondary | ICD-10-CM | POA: Diagnosis not present

## 2022-04-25 DIAGNOSIS — Z79899 Other long term (current) drug therapy: Secondary | ICD-10-CM

## 2022-04-25 DIAGNOSIS — R931 Abnormal findings on diagnostic imaging of heart and coronary circulation: Secondary | ICD-10-CM | POA: Diagnosis not present

## 2022-04-25 DIAGNOSIS — M79605 Pain in left leg: Secondary | ICD-10-CM

## 2022-04-25 DIAGNOSIS — M79604 Pain in right leg: Secondary | ICD-10-CM

## 2022-04-25 MED ORDER — ROSUVASTATIN CALCIUM 5 MG PO TABS: 5.0000 mg | ORAL_TABLET | Freq: Every day | ORAL | 3 refills | Status: DC

## 2022-04-25 MED ORDER — METOPROLOL TARTRATE 50 MG PO TABS: 50.0000 mg | ORAL_TABLET | Freq: Once | ORAL | 0 refills | Status: DC

## 2022-04-25 NOTE — Patient Instructions (Addendum)
Medication Instructions:  ?METOPROLOL '50mg'$  TWO HOURS PRIOR TO CCTA SCAN  ? ?START: ROSUVASTATIN '5mg'$  ONCE DAILY  ? ? ?*If you need a refill on your cardiac medications before your next appointment, please call your pharmacy* ? ?Lab Work: ? ?BMET- TODAY  ? ?Please return for FASTING Blood Work in [place] . No appointment needed, lab here at the office is open Monday-Friday from 8AM to 4PM and closed daily for lunch from 12:45-1:45.  ? ?If you have labs (blood work) drawn today and your tests are completely normal, you will receive your results only by: ?MyChart Message (if you have MyChart) OR ?A paper copy in the mail ?If you have any lab test that is abnormal or we need to change your treatment, we will call you to review the results. ? ?Testing/Procedures: ?Your physician has requested that you have cardiac CT. Cardiac computed tomography (CT) is a painless test that uses an x-ray machine to take clear, detailed pictures of your heart. For further information please visit [email or web address]. Please follow instruction sheet as given. ? ?Follow-Up: ?At [facility], you and your health needs are our priority.  As part of our continuing mission to provide you with exceptional heart care, we have created designated Provider Care Teams.  These Care Teams include your primary Cardiologist (physician) and Advanced Practice Providers (APPs -  Physician Assistants and Nurse Practitioners) who all work together to provide you with the care you need, when you need it. ? ?Your next appointment:   ?6 month(s) ? ?The format for your next appointment:   ?In Person ? ?Provider:   ?Elouise Munroe, MD   ? ?Other Instructions ? ? ?Your cardiac CT will be scheduled at one of the below locations:  ? ?[facility] ?[address] ?[place], [place] [address] ?[phone] ? ?If scheduled at [facility], please arrive at the [facility] Entrance (Entrance C2) of [facility] 30 minutes prior  to test start time. ?You can use the FREE valet parking offered at entrance C (encouraged to control the heart rate for the test)  ?Proceed to the [facility] Radiology Department (first floor) to check-in and test prep. ? ?All radiology patients and guests should use entrance C2 at [facility], accessed from [facility], even though the hospital's physical address listed is [address] ? ? ? ? ?Please follow these instructions carefully (unless otherwise directed): ? ?Hold all erectile dysfunction medications at least 3 days (72 hrs) prior to test. ? ?On the Night Before the Test: ?Be sure to Drink plenty of water. ?Do not consume any caffeinated/decaffeinated beverages or chocolate 12 hours prior to your test. ?Do not take any antihistamines 12 hours prior to your test. ? ?On the Day of the Test: ?Drink plenty of water until 1 hour prior to the test. ?Do not eat any food 4 hours prior to the test. ?You may take your regular medications prior to the test.  ?Take metoprolol (Lopressor) '50mg'$  two hours prior to test. ?HOLD Furosemide/Hydrochlorothiazide morning of the test. ?FEMALES- please wear underwire-free bra if available, avoid dresses & tight clothing ? ?After the Test: ?Drink plenty of water. ?After receiving IV contrast, you may experience a mild flushed feeling. This is normal. ?On occasion, you may experience a mild rash up to 24 hours after the test. This is not dangerous. If this occurs, you can take Benadryl 25 mg and increase your fluid intake. ?If you experience trouble  breathing, this can be serious. If it is severe call 911 IMMEDIATELY. If it is mild, please call our office. ?If you take any of these medications: Glipizide/Metformin, Avandament, Glucavance, please do not take 48 hours after completing test unless otherwise instructed. ? ?We will call to schedule your test 2-4 weeks out understanding that some insurance companies will need an authorization prior to the  service being performed.  ? ?For non-scheduling related questions, please contact the cardiac imaging nurse navigator should you have any questions/concerns: ?Marchia Bond, Cardiac Imaging Nurse Navigator ?Gordy Clement, Cardiac Imaging Nurse Navigator ?[facility] ?Direct Office Dial: [phone]  ? ?For scheduling needs, including cancellations and rescheduling, please call [place], [phone]. ? ? ? ? ? ? ?  ?

## 2022-04-25 NOTE — Progress Notes (Signed)
Cardiology Office Note:    Date:  04/25/2022   ID:  Warren Hill, DOB 1956-04-05, MRN [id number]  PCP:  Shade Flood, MD  Cardiologist:  Parke Poisson, MD  Electrophysiologist:  None   Referring MD: Shade Flood, MD   Chief Complaint/Reason for Referral: Follow up bilateral leg pain, HLD, HTN, DOE  History of Present Illness:    Warren Hill is a 66 y.o. male with a history of HTN, GERD, HLD who presents for follow up of DOE and HTN.   Short winded chopping with an axe. Walking 1 mile on Wednesdays and no symptoms.   We reviewed coronary calcium scoring of 76 in the context of DOE. Also reviewed echo which was grossly normal aside from mild aorta dilation which we will follow. Reviewed lipids which have improved but remain with suboptimal control.  The patient denies chest pain, chest pressure, dyspnea at rest, palpitations, PND, orthopnea, or leg swelling. Denies cough, fever, chills. Denies nausea, vomiting. Denies syncope or presyncope. Denies dizziness or lightheadedness.    Past Medical History:  Diagnosis Date   Adenomatous polyp of colon    GERD (gastroesophageal reflux disease)    Hypertension    Tinnitus of both ears     Past Surgical History:  Procedure Laterality Date   COLONOSCOPY     KNEE ARTHROSCOPY Left 11/2020   TONSILLECTOMY AND ADENOIDECTOMY  12/19/1960    Current Medications: Current Meds  Medication Sig   aspirin 81 MG EC tablet Take 81 mg by mouth daily. Swallow whole.   famotidine-calcium carbonate-magnesium hydroxide (PEPCID COMPLETE) 10-800-165 MG CHEW chewable tablet Chew 1 tablet by mouth daily as needed.   lisinopril-hydrochlorothiazide (ZESTORETIC) 10-12.5 MG tablet Take 1 tablet by mouth daily.   metoprolol tartrate (LOPRESSOR) 50 MG tablet Take 1 tablet (50 mg total) by mouth once for 1 dose. PLEASE TAKE METOPROLOL 2  HOURS PRIOR TO CTA SCAN.   multivitamin-lutein (OCUVITE-LUTEIN) CAPS capsule Take 2 capsules by mouth daily.    rosuvastatin (CRESTOR) 5 MG tablet Take 1 tablet (5 mg total) by mouth daily.   zolpidem (AMBIEN) 5 MG tablet Take 1 tablet (5 mg total) by mouth at bedtime as needed for sleep. Ok to place on hold if needed.   Current Facility-Administered Medications for the 04/25/22 encounter (Office Visit) with Parke Poisson, MD  Medication   0.9 %  sodium chloride infusion     Allergies:   Other, Levofloxacin, and Penicillins   Social History   Tobacco Use   Smoking status: Never   Smokeless tobacco: Never  Vaping Use   Vaping Use: Never used  Substance Use Topics   Alcohol use: Yes    Alcohol/week: 14.0 standard drinks    Types: 14 Glasses of wine per week   Drug use: No     Family History: The patient's family history includes Colon cancer in his maternal grandmother; Colon polyps in his brother, father, and mother; Diabetes in his paternal grandmother; Hypertension in his father and mother; Liver disease in his paternal uncle; Pancreatic cancer in his paternal uncle.  ROS:   Please see the history of present illness.    All other systems reviewed and are negative.  EKGs/Labs/Other Studies Reviewed:    The following studies were reviewed today: [name] LE Doppler 11/02/2020: Right: Resting right ankle-brachial index indicates noncompressible right  lower extremity arteries. The right toe-brachial index is normal.  Left: Resting left ankle-brachial index is within normal range. No  evidence of significant left  lower extremity arterial disease. The left  toe-brachial index is normal.   Echo 10/26/2020: 1. Left ventricular ejection fraction, by estimation, is 60 to 65%. The  left ventricle has normal function. The left ventricle has no regional  wall motion abnormalities. There is mild left ventricular hypertrophy.  Left ventricular diastolic parameters  were normal.   2. Right ventricular systolic function is normal. The right ventricular  size is mildly enlarged. There is normal  pulmonary artery systolic  pressure. The estimated right ventricular systolic pressure is 23.2 mmHg.   3. The mitral valve is normal in structure. No evidence of mitral valve  regurgitation.   4. The aortic valve was not well visualized. Aortic valve regurgitation  is trivial. No aortic stenosis is present.   5. Aortic dilatation noted. There is mild dilatation of the ascending  aorta, measuring 39 mm.   6. The inferior vena cava is normal in size with greater than 50%  respiratory variability, suggesting right atrial pressure of 3 mmHg.   [name] LE Venous Reflux 06/26/2020: Right:  - No evidence of deep vein thrombosis seen in the right lower extremity,  from the common femoral through the popliteal veins.  - No evidence of superficial venous thrombosis in the right lower  extremity.  - Deep vein reflux in the CFV.  - Superficial vein reflux in the SSV, and GSV as above.  Left:  - No evidence of deep vein thrombosis seen in the left lower extremity,  from the common femoral through the popliteal veins.  - No evidence of superficial venous thrombosis in the left lower  extremity.  - Deep vein reflux in the CFV.  - Superficial vein reflux in the SSV, SFJ, and GSV as above.   EKG: 11/08/21: NSR, rate 74 bpm 06/04/2021: NSR, rate 71 bpm 10/08/2020: NSR, rate 68 bpm  Recent Labs: 10/08/2021: Hemoglobin 15.1; Platelets 141.0 03/08/2022: ALT 21; BUN 12; Creatinine, Ser 0.88; Potassium 4.4; Sodium 142  Recent Lipid Panel    Component Value Date/Time   CHOL 183 03/08/2022 1041   TRIG 154 (H) 03/08/2022 1041   HDL 46 03/08/2022 1041   CHOLHDL 4.0 03/08/2022 1041   CHOLHDL 4 10/08/2021 0859   VLDL 40.0 10/08/2021 0859   LDLCALC 110 (H) 03/08/2022 1041    Physical Exam:    VS:  BP 118/76   Pulse 64   Ht 5\' 11"  (1.803 m)   Wt 262 lb 3.2 oz (118.9 kg)   SpO2 96%   BMI 36.57 kg/m     Wt Readings from Last 5 Encounters:  04/25/22 262 lb 3.2 oz (118.9 kg)  04/11/22 262 lb 9.6 oz  (119.1 kg)  02/04/22 263 lb (119.3 kg)  11/08/21 263 lb 6.4 oz (119.5 kg)  10/08/21 263 lb (119.3 kg)    Constitutional: No acute distress Eyes: sclera non-icteric, normal conjunctiva and lids ENMT: normal dentition, moist mucous membranes Cardiovascular: regular rhythm, normal rate, no murmurs. S1 and S2 normal. Radial pulses normal bilaterally. No jugular venous distention.  Respiratory: clear to auscultation bilaterally GI : normal bowel sounds, soft and nontender. No distention.   MSK: extremities warm, well perfused. No edema.  NEURO: grossly nonfocal exam, moves all extremities. PSYCH: alert and oriented x 3, normal mood and affect.   ASSESSMENT:    1. Elevated coronary artery calcium score   2. Dyspnea on exertion   3. Mixed hyperlipidemia   4. Dilation of aorta (HCC)   5. Hypertension, unspecified type   6. Bilateral  leg pain   7. Medication management      PLAN:    Mixed hyperlipidemia CAC DOE Med management -LDL and trig above goal, LDL is 110 and Trig 154. Not currently on a statin. Improved with diet and lifestyle however still not at goal of LDL < 70 and Trig <150.  - cor cal CT elevated, now with DOE.  - recommend CCTA for DOE, will help to address symptoms and tailor medication therapy.  - start crestor 5 mg daily and Lipids/LFTs in 3 mo  Bilateral leg pain - LE arterial studies negative.  Pain continues but has been thoroughly investigated. He will continue to monitor.  Hypertension, unspecified type - BP well controlled on current therapy, continue lisinopril HCTZ.   Total time of encounter: 30 minutes total time of encounter, including 20 minutes spent in face-to-face patient care on the date of this encounter. This time includes coordination of care and counseling regarding above mentioned problem list. Remainder of non-face-to-face time involved reviewing chart documents/testing relevant to the patient encounter and documentation in the medical record. I  have independently reviewed documentation from referring provider.   Weston Brass, MD, [facility] [place]  [facility]     Medication Adjustments/Labs and Tests Ordered: Current medicines are reviewed at length with the patient today.  Concerns regarding medicines are outlined above.   Orders Placed This Encounter  Procedures   CT CORONARY MORPH W/CTA COR W/SCORE W/CA W/CM &/OR WO/CM   Basic metabolic panel   Lipid panel   Hepatic function panel   EKG 12-Lead     Meds ordered this encounter  Medications   metoprolol tartrate (LOPRESSOR) 50 MG tablet    Sig: Take 1 tablet (50 mg total) by mouth once for 1 dose. PLEASE TAKE METOPROLOL 2  HOURS PRIOR TO CTA SCAN.    Dispense:  1 tablet    Refill:  0   rosuvastatin (CRESTOR) 5 MG tablet    Sig: Take 1 tablet (5 mg total) by mouth daily.    Dispense:  90 tablet    Refill:  3     Patient Instructions  Medication Instructions:  METOPROLOL 50mg  TWO HOURS PRIOR TO CCTA SCAN   START: ROSUVASTATIN 5mg  ONCE DAILY    *If you need a refill on your cardiac medications before your next appointment, please call your pharmacy*  Lab Work:  BMET- TODAY   Please return for FASTING Blood Work in 3 MONTHS . No appointment needed, lab here at the office is open Monday-Friday from 8AM to 4PM and closed daily for lunch from 12:45-1:45.   If you have labs (blood work) drawn today and your tests are completely normal, you will receive your results only by: MyChart Message (if you have MyChart) OR A paper copy in the mail If you have any lab test that is abnormal or we need to change your treatment, we will call you to review the results.  Testing/Procedures: Your physician has requested that you have cardiac CT. Cardiac computed tomography (CT) is a painless test that uses an x-ray machine to take clear, detailed pictures of your heart. For further information please visit [email or web address] Please follow instruction sheet as  given.  Follow-Up: At [facility], you and your health needs are our priority.  As part of our continuing mission to provide you with exceptional heart care, we have created designated Provider Care Teams.  These Care Teams include your primary Cardiologist (physician) and Advanced Practice Providers (APPs -  Physician  Assistants and Nurse Practitioners) who all work together to provide you with the care you need, when you need it.  Your next appointment:   6 month(s)  The format for your next appointment:   In Person  Provider:   Parke Poisson, MD    Other Instructions   Your cardiac CT will be scheduled at one of the below locations:   [facility] [address] [place], [place] [address] [phone]  If scheduled at [facility], please arrive at the [facility] and Children's Entrance (Entrance C2) of [facility] 30 minutes prior to test start time. You can use the FREE valet parking offered at entrance C (encouraged to control the heart rate for the test)  Proceed to the [facility] Radiology Department (first floor) to check-in and test prep.  All radiology patients and guests should use entrance C2 at [facility], accessed from [facility], even though the hospital's physical address listed is [address]     Please follow these instructions carefully (unless otherwise directed):  Hold all erectile dysfunction medications at least 3 days (72 hrs) prior to test.  On the Night Before the Test: Be sure to Drink plenty of water. Do not consume any caffeinated/decaffeinated beverages or chocolate 12 hours prior to your test. Do not take any antihistamines 12 hours prior to your test.  On the Day of the Test: Drink plenty of water until 1 hour prior to the test. Do not eat any food 4 hours prior to the test. You may take your regular medications prior to the test.  Take metoprolol (Lopressor) 50mg  two hours  prior to test. HOLD Furosemide/Hydrochlorothiazide morning of the test. FEMALES- please wear underwire-free bra if available, avoid dresses & tight clothing  After the Test: Drink plenty of water. After receiving IV contrast, you may experience a mild flushed feeling. This is normal. On occasion, you may experience a mild rash up to 24 hours after the test. This is not dangerous. If this occurs, you can take Benadryl 25 mg and increase your fluid intake. If you experience trouble breathing, this can be serious. If it is severe call 911 IMMEDIATELY. If it is mild, please call our office. If you take any of these medications: Glipizide/Metformin, Avandament, Glucavance, please do not take 48 hours after completing test unless otherwise instructed.  We will call to schedule your test 2-4 weeks out understanding that some insurance companies will need an authorization prior to the service being performed.   For non-scheduling related questions, please contact the cardiac imaging nurse navigator should you have any questions/concerns: Rockwell Alexandria, Cardiac Imaging Nurse Navigator Larey Brick, Cardiac Imaging Nurse Navigator [facility] Direct Office Dial: [phone]   For scheduling needs, including cancellations and rescheduling, please call [place], [phone].

## 2022-05-02 DIAGNOSIS — R0609 Other forms of dyspnea: Secondary | ICD-10-CM | POA: Diagnosis not present

## 2022-05-02 DIAGNOSIS — E782 Mixed hyperlipidemia: Secondary | ICD-10-CM | POA: Diagnosis not present

## 2022-05-02 DIAGNOSIS — R931 Abnormal findings on diagnostic imaging of heart and coronary circulation: Secondary | ICD-10-CM | POA: Diagnosis not present

## 2022-05-03 ENCOUNTER — Telehealth (HOSPITAL_COMMUNITY): Payer: Self-pay | Admitting: *Deleted

## 2022-05-03 LAB — BASIC METABOLIC PANEL
BUN/Creatinine Ratio: 17 (ref 10–24)
BUN: 14 mg/dL (ref 8–27)
CO2: 20 mmol/L (ref 20–29)
Calcium: 9 mg/dL (ref 8.6–10.2)
Chloride: 106 mmol/L (ref 96–106)
Creatinine, Ser: 0.84 mg/dL (ref 0.76–1.27)
Glucose: 134 mg/dL — ABNORMAL HIGH (ref 70–99)
Potassium: 4.1 mmol/L (ref 3.5–5.2)
Sodium: 141 mmol/L (ref 134–144)
eGFR: 96 mL/min/{1.73_m2} (ref >59)

## 2022-05-03 NOTE — Telephone Encounter (Signed)
Reaching out to patient to offer assistance regarding upcoming cardiac imaging study; pt verbalizes understanding of appt date/time, parking situation and where to check in, pre-test NPO status and medications ordered, and verified current allergies; name and call back number provided for further questions should they arise ? ?Warren Clement RN Navigator Cardiac Imaging ?[facility] ?[phone] office ?[phone] cell ? ?Patient to take '50mg'$  metoprolol tartrate two hours prior to his cardiac CT scan.  He is aware to arrive at 7:15am. ?

## 2022-05-05 ENCOUNTER — Ambulatory Visit (HOSPITAL_COMMUNITY)
Admission: RE | Admit: 2022-05-05 | Discharge: 2022-05-05 | Disposition: A | Discharge status: Home / Self Care | Payer: Medicare Other | Source: Ambulatory Visit | Attending: Internal Medicine | Admitting: Internal Medicine

## 2022-05-05 ENCOUNTER — Ambulatory Visit (HOSPITAL_BASED_OUTPATIENT_CLINIC_OR_DEPARTMENT_OTHER)
Admission: RE | Admit: 2022-05-05 | Discharge: 2022-05-05 | Disposition: A | Discharge status: Home / Self Care | Payer: Medicare Other | Source: Ambulatory Visit | Attending: Internal Medicine | Admitting: Internal Medicine

## 2022-05-05 DIAGNOSIS — R0609 Other forms of dyspnea: Secondary | ICD-10-CM | POA: Insufficient documentation

## 2022-05-05 DIAGNOSIS — I2510 Atherosclerotic heart disease of native coronary artery without angina pectoris: Secondary | ICD-10-CM | POA: Diagnosis not present

## 2022-05-05 DIAGNOSIS — R931 Abnormal findings on diagnostic imaging of heart and coronary circulation: Secondary | ICD-10-CM | POA: Diagnosis not present

## 2022-05-05 DIAGNOSIS — I517 Cardiomegaly: Secondary | ICD-10-CM | POA: Insufficient documentation

## 2022-05-05 DIAGNOSIS — I700 Atherosclerosis of aorta: Secondary | ICD-10-CM | POA: Insufficient documentation

## 2022-05-05 MED ORDER — NITROGLYCERIN 0.4 MG SL SUBL
0.8000 mg | SUBLINGUAL_TABLET | Freq: Once | SUBLINGUAL | Status: AC
  Administered 2022-05-05: 0.8 mg via SUBLINGUAL

## 2022-05-05 MED ORDER — NITROGLYCERIN 0.4 MG SL SUBL
SUBLINGUAL_TABLET | SUBLINGUAL | Status: AC
  Filled 2022-05-05: qty 2

## 2022-05-05 MED ORDER — IOHEXOL 350 MG/ML SOLN
100.0000 mL | Freq: Once | INTRAVENOUS | Status: AC | PRN
  Administered 2022-05-05: 100 mL via INTRAVENOUS

## 2022-05-09 ENCOUNTER — Encounter: Payer: Self-pay | Admitting: Internal Medicine

## 2022-05-23 DIAGNOSIS — H02831 Dermatochalasis of right upper eyelid: Secondary | ICD-10-CM | POA: Diagnosis not present

## 2022-05-23 DIAGNOSIS — H02834 Dermatochalasis of left upper eyelid: Secondary | ICD-10-CM | POA: Diagnosis not present

## 2022-05-23 DIAGNOSIS — H2513 Age-related nuclear cataract, bilateral: Secondary | ICD-10-CM | POA: Diagnosis not present

## 2022-05-23 DIAGNOSIS — H353132 Nonexudative age-related macular degeneration, bilateral, intermediate dry stage: Secondary | ICD-10-CM | POA: Diagnosis not present

## 2022-06-12 ENCOUNTER — Encounter: Payer: Self-pay | Admitting: Gastroenterology

## 2022-09-01 ENCOUNTER — Encounter: Payer: Self-pay | Admitting: Family Medicine

## 2022-09-01 ENCOUNTER — Other Ambulatory Visit: Payer: Self-pay

## 2022-09-01 ENCOUNTER — Ambulatory Visit (INDEPENDENT_AMBULATORY_CARE_PROVIDER_SITE_OTHER): Payer: Medicare Other | Admitting: Family Medicine

## 2022-09-01 VITALS — BP 122/68 | HR 69 | Temp 98.1°F | Resp 18 | Ht 71.0 in | Wt 250.6 lb

## 2022-09-01 DIAGNOSIS — R0981 Nasal congestion: Secondary | ICD-10-CM

## 2022-09-01 DIAGNOSIS — Z8669 Personal history of other diseases of the nervous system and sense organs: Secondary | ICD-10-CM | POA: Diagnosis not present

## 2022-09-01 DIAGNOSIS — H938X1 Other specified disorders of right ear: Secondary | ICD-10-CM | POA: Diagnosis not present

## 2022-09-01 MED ORDER — FLUTICASONE PROPIONATE 50 MCG/ACT NA SUSP: 2.0000 | Freq: Every day | NASAL | 6 refills | Status: DC

## 2022-09-01 MED ORDER — AZITHROMYCIN 250 MG PO TABS: ORAL_TABLET | ORAL | 0 refills | Status: AC

## 2022-09-01 MED ORDER — AZELASTINE HCL 0.1 % NA SOLN: 1.0000 | Freq: Two times a day (BID) | NASAL | 6 refills | Status: DC

## 2022-09-01 NOTE — Progress Notes (Signed)
Subjective:  Patient ID: Warren Hill, male    DOB: [birth year]  Age: 66 y.o. MRN: [id number]  CC:  Chief Complaint  Patient presents with   Ear Problem    Patient states he feels like his ear is clogged but also some pain.    HPI Trig Mcbryar presents for   R ear: Pain on and off. Feels clogged in am - almost every morning past few weeks, tried nasal saline and decongestants - opens up some with meds and self insufflation. No ear d/c, no fever. Wears hearing aids.  Has taken antihistamine as well in past - advised by prior ENT of alternating decongestant and antihistamine. Some nasal congestion. No sneezing. PND with cough at times. No dyspnea.   Will be traveling out of country for 10 days next week. Concnerned about possible infection.   History Patient Active Problem List   Diagnosis Date Noted   Calf swelling 02/04/2022   Nerve pain 02/04/2022   HTN (hypertension) 01/18/2012   Insomnia 01/18/2012   Tinnitus 01/18/2012   GERD 05/28/2008   COLITIS 05/28/2008   COLONIC POLYPS, ADENOMATOUS, HX OF 05/28/2008   Past Medical History:  Diagnosis Date   Adenomatous polyp of colon    GERD (gastroesophageal reflux disease)    Hypertension    Tinnitus of both ears    Past Surgical History:  Procedure Laterality Date   COLONOSCOPY     KNEE ARTHROSCOPY Left 11/2020   TONSILLECTOMY AND ADENOIDECTOMY  12/19/1960   Allergies  Allergen Reactions   Levofloxacin Hives and Rash    Skin got like leather   Penicillins Rash   Prior to Admission medications   Medication Sig Start Date End Date Taking? Authorizing Provider  aspirin 81 MG EC tablet Take 81 mg by mouth daily. Swallow whole.   Yes [provider]  famotidine-calcium carbonate-magnesium hydroxide (PEPCID COMPLETE) 10-800-165 MG CHEW chewable tablet Chew 1 tablet by mouth daily as needed.   Yes [provider]  lisinopril-hydrochlorothiazide (ZESTORETIC) 10-12.5 MG tablet Take 1 tablet by mouth daily.  04/11/22  Yes [name], MD  multivitamin-lutein [facility]) CAPS capsule Take 2 capsules by mouth daily.   Yes [provider]  rosuvastatin (CRESTOR) 5 MG tablet Take 1 tablet (5 mg total) by mouth daily. 04/25/22  Yes Elouise Munroe, MD  zolpidem (AMBIEN) 5 MG tablet Take 1 tablet (5 mg total) by mouth at bedtime as needed for sleep. Ok to place on hold if needed. 04/11/22  Yes [name], MD  metoprolol tartrate (LOPRESSOR) 50 MG tablet Take 1 tablet (50 mg total) by mouth once for 1 dose. PLEASE TAKE METOPROLOL 2  HOURS PRIOR TO CTA SCAN. 04/25/22 04/25/22  Elouise Munroe, MD   Social History   Socioeconomic History   Marital status: Married    Spouse name: Not on file   Number of children: 3   Years of education: Not on file   Highest education level: Not on file  Occupational History   Occupation: sales    Employer: [employer]  Tobacco Use   Smoking status: Never   Smokeless tobacco: Never  Vaping Use   Vaping Use: Never used  Substance and Sexual Activity   Alcohol use: Yes    Alcohol/week: 14.0 standard drinks of alcohol    Types: 14 Glasses of wine per week   Drug use: No   Sexual activity: Yes    Birth control/protection: None  Other Topics Concern   Not on  file  Social History Narrative   Not on file   Social Determinants of Health   Financial Resource Strain: Not on file  Food Insecurity: Not on file  Transportation Needs: Not on file  Physical Activity: Not on file  Stress: Not on file  Social Connections: Not on file  Intimate Partner Violence: Not on file    Review of Systems   Objective:   Vitals:   09/01/22 0755  BP: 122/68  Pulse: 69  Resp: 18  Temp: 98.1 F (36.7 C)  TempSrc: Temporal  SpO2: 99%  Weight: 250 lb 9.6 oz (113.7 kg)  Height: '5\' 11"'$  (1.803 m)     Physical Exam Vitals reviewed.  Constitutional:      Appearance: He is well-developed.  HENT:     Head: Normocephalic and atraumatic.     Right  Ear: Ear canal and external ear normal.     Left Ear: Tympanic membrane, ear canal and external ear normal.     Ears:     Comments: Small amt clear fluid at base on R, no erythema, injection. Canal clear, no d/c or significant cerumen.     Nose: No rhinorrhea.     Comments: Min edema bilat turbinates.     Mouth/Throat:     Pharynx: No oropharyngeal exudate or posterior oropharyngeal erythema.  Eyes:     Conjunctiva/sclera: Conjunctivae normal.     Pupils: Pupils are equal, round, and reactive to light.  Cardiovascular:     Rate and Rhythm: Normal rate and regular rhythm.     Heart sounds: Normal heart sounds. No murmur heard. Pulmonary:     Effort: Pulmonary effort is normal.     Breath sounds: Normal breath sounds. No wheezing, rhonchi or rales.  Abdominal:     Palpations: Abdomen is soft.     Tenderness: There is no abdominal tenderness.  Musculoskeletal:     Cervical back: Neck supple.  Lymphadenopathy:     Cervical: No cervical adenopathy.  Skin:    General: Skin is warm and dry.     Findings: No rash.  Neurological:     Mental Status: He is alert and oriented to person, place, and time.  Psychiatric:        Behavior: Behavior normal.        Assessment & Plan:  Warren Hill is a 66 y.o. male . Ear congestion, right  Nasal congestion  History of otitis media  Canal was clear, no sign of otitis externa or media at this time.  Has experienced in the past.  Will be traveling [place] next week.  Currently appears to be likely serous otitis or component of eustachian tube dysfunction from sinus/nasal congestion.  Continue saline nasal spray, add Flonase nasal spray, or Astelin nasal spray.  Short-term Afrin only if needed with potential risk discussed.  Azithromycin prescribed if signs/symptoms of otitis media when out of country with potential risks of antibiotics discussed.  RTC precautions, and option of ENT follow-up if persistent difficulties.  Meds ordered this  encounter  Medications   fluticasone (FLONASE) 50 MCG/ACT nasal spray    Sig: Place 2 sprays into both nostrils daily.    Dispense:  16 g    Refill:  6   azithromycin (ZITHROMAX) 250 MG tablet    Sig: Take 2 tablets on day 1, then 1 tablet daily on days 2 through 5. Start if symptoms of ear infection.    Dispense:  6 tablet    Refill:  0  azelastine (ASTELIN) 0.1 % nasal spray    Sig: Place 1 spray into both nostrils 2 (two) times daily. Use in each nostril as directed as needed.    Dispense:  30 mL    Refill:  6   Patient Instructions  Ear congestion/discomfort likely related to congestion behind the eardrum, but I do not see any sign of infection.  That often can be due to allergies or nasal congestion.  Try Flonase nasal spray, or the Astelin nasal spray, whichever one works better for you.  Okay to use occasional over-the-counter antihistamine such as Claritin.  Saline nasal spray is fine.  If you do use Afrin, do not use that for more than 3 days.  If increasing pain in the ear or symptoms of ear infection when you are overseas, can start azithromycin.  If you continue to have issues, let me know and I will refer you to ENT.  Return to the clinic or go to the nearest emergency room if any of your symptoms worsen or new symptoms occur.     Signed,   Merri Ray, MD [place] 09/01/22 8:17 AM

## 2022-09-01 NOTE — Patient Instructions (Signed)
Ear congestion/discomfort likely related to congestion behind the eardrum, but I do not see any sign of infection.  That often can be due to allergies or nasal congestion.  Try Flonase nasal spray, or the Astelin nasal spray, whichever one works better for you.  Okay to use occasional over-the-counter antihistamine such as Claritin.  Saline nasal spray is fine.  If you do use Afrin, do not use that for more than 3 days.  If increasing pain in the ear or symptoms of ear infection when you are overseas, can start azithromycin.  If you continue to have issues, let me know and I will refer you to ENT.  Return to the clinic or go to the nearest emergency room if any of your symptoms worsen or new symptoms occur.

## 2022-09-30 ENCOUNTER — Other Ambulatory Visit: Payer: Self-pay

## 2022-09-30 DIAGNOSIS — E782 Mixed hyperlipidemia: Secondary | ICD-10-CM

## 2022-10-07 DIAGNOSIS — H353132 Nonexudative age-related macular degeneration, bilateral, intermediate dry stage: Secondary | ICD-10-CM | POA: Diagnosis not present

## 2022-10-07 DIAGNOSIS — H02831 Dermatochalasis of right upper eyelid: Secondary | ICD-10-CM | POA: Diagnosis not present

## 2022-10-07 DIAGNOSIS — H2512 Age-related nuclear cataract, left eye: Secondary | ICD-10-CM | POA: Diagnosis not present

## 2022-10-07 DIAGNOSIS — H02834 Dermatochalasis of left upper eyelid: Secondary | ICD-10-CM | POA: Diagnosis not present

## 2022-10-12 ENCOUNTER — Encounter: Payer: Self-pay | Admitting: Family Medicine

## 2022-10-12 ENCOUNTER — Ambulatory Visit (INDEPENDENT_AMBULATORY_CARE_PROVIDER_SITE_OTHER): Payer: Medicare Other | Admitting: Family Medicine

## 2022-10-12 VITALS — BP 118/60 | HR 65 | Temp 97.9°F | Ht 72.0 in | Wt 250.8 lb

## 2022-10-12 DIAGNOSIS — Z125 Encounter for screening for malignant neoplasm of prostate: Secondary | ICD-10-CM

## 2022-10-12 DIAGNOSIS — I10 Essential (primary) hypertension: Secondary | ICD-10-CM

## 2022-10-12 DIAGNOSIS — Z131 Encounter for screening for diabetes mellitus: Secondary | ICD-10-CM

## 2022-10-12 DIAGNOSIS — Z0000 Encounter for general adult medical examination without abnormal findings: Secondary | ICD-10-CM | POA: Diagnosis not present

## 2022-10-12 DIAGNOSIS — Z6834 Body mass index (BMI) 34.0-34.9, adult: Secondary | ICD-10-CM

## 2022-10-12 DIAGNOSIS — E785 Hyperlipidemia, unspecified: Secondary | ICD-10-CM

## 2022-10-12 DIAGNOSIS — E669 Obesity, unspecified: Secondary | ICD-10-CM

## 2022-10-12 MED ORDER — LISINOPRIL-HYDROCHLOROTHIAZIDE 10-12.5 MG PO TABS: 1.0000 | ORAL_TABLET | Freq: Every day | ORAL | 2 refills | Status: DC

## 2022-10-12 NOTE — Progress Notes (Signed)
Subjective:  Patient ID: Warren Hill, male    DOB: 22-Dec-1955  Age: 66 y.o. MRN: [id number]  CC:  Chief Complaint  Patient presents with   Annual Exam    Pt states all is well    HPI Warren Hill presents for Annual Exam  Care team: PCP, me Cardiology, Dr. Alveda Reasons.  History of DOE, mild aortic dilation on echo, coronary calcium score of 76.  CT fractional flow reserve without evidence of significant functional stenosis in May.  Started Crestor in May.  Podiatry, Dr. Blenda Mounts ENT Dr. Lucia Gaskins Vascular Dr. Donzetta Matters  Hypertension: Lisinopril HCTZ 10/12.5 mg daily. No new side effects with meds.  Home readings: none BP Readings from Last 3 Encounters:  10/12/22 118/60  09/01/22 122/68  05/05/22 102/61   Lab Results  Component Value Date   CREATININE 0.84 05/02/2022   Intermittent insomnia For merrily with travel, has used Ambien 5 mg as needed.  Controlled substance database reviewed last filled #[id number] on 04/11/2022, previously 12/16/2021. Only needed 3 since last rx.   Hyperlipidemia: Crestor 5 mg started May 8. Not fasting today. Appt in 2 weeks.  Lab Results  Component Value Date   CHOL 183 03/08/2022   HDL 46 03/08/2022   LDLCALC 110 (H) 03/08/2022   TRIG 154 (H) 03/08/2022   CHOLHDL 4.0 03/08/2022   Lab Results  Component Value Date   ALT 21 03/08/2022   AST 21 03/08/2022   ALKPHOS 68 03/08/2022   BILITOT 1.0 03/08/2022       10/12/2022    8:01 AM 09/01/2022    7:56 AM 04/11/2022    8:40 AM 02/04/2022    9:42 AM 10/08/2021    8:14 AM  Depression screen PHQ 2/9  Decreased Interest 0 0 0 0 1  Down, Depressed, Hopeless 0 0 0 0 0  PHQ - 2 Score 0 0 0 0 1  Altered sleeping 0 '1 1 1 2  '$ Tired, decreased energy '1 1 1 1 2  '$ Change in appetite 0 0 0 0 0  Feeling bad or failure about yourself  0 0 0 0 0  Trouble concentrating 0 1 0 0 0  Moving slowly or fidgety/restless 0 0 0 0 0  Suicidal thoughts 0 0 0 0 0  PHQ-9 Score '1 3 2 2 5  '$ Difficult doing work/chores  Not  difficult at all  Somewhat difficult     Health Maintenance  Topic Date Due   COVID-19 Vaccine (5 - Moderna risk series) 10/28/2022 (Originally 12/03/2021)   Medicare Annual Wellness (AWV)  12/16/2022 (Originally 1956/04/17)   COLONOSCOPY (Pts 45-6yr Insurance coverage will need to be confirmed)  09/02/2023 (Originally 04/19/2022)   TETANUS/TDAP  08/19/2032   Pneumonia Vaccine 67 Years old  Completed   INFLUENZA VACCINE  Completed   Hepatitis C Screening  Completed   Zoster Vaccines- Shingrix  Completed   HPV VACCINES  Aged Out  Colonoscopy, Dr. NSilverio Decamp 04/19/2017, single precancerous polyp removed, diverticulosis.  Repeat 5 years Prostate: does not have family history of prostate cancer The natural history of prostate cancer and ongoing controversy regarding screening and potential treatment outcomes of prostate cancer has been discussed with the patient. The meaning of a false positive PSA and a false negative PSA has been discussed. He indicates understanding of the limitations of this screening test and wishes to proceed with screening PSA testing. Lab Results  Component Value Date   PSA1 0.9 09/09/2020   PSA1 0.8 07/05/2019   PSA1 0.8 03/08/2018  PSA 0.81 10/08/2021   PSA 0.6 11/03/2016   PSA 0.94 10/16/2015      Immunization History  Administered Date(s) Administered   Fluad Quad(high Dose 65+) 08/19/2022   Influenza Split 09/22/2012   Influenza, Quadrivalent, Recombinant, Inj, Pf 08/26/2018   Influenza,inj,Quad PF,6+ Mos 08/06/2014, 10/16/2015, 10/08/2016, 08/26/2017, 08/17/2019   Moderna Sars-Covid-2 Vaccination 03/09/2020, 04/06/2020, 12/04/2020   PNEUMOCOCCAL CONJUGATE-20 04/11/2022   Pfizer Covid-19 Vaccine Bivalent Booster 38yr & up 10/08/2021   Pneumococcal Polysaccharide-23 09/09/2020   Tdap 12/19/2010, 08/19/2022   Zoster Recombinat (Shingrix) 07/07/2019, 07/31/2019  Updated COVID booster - spouse had infection 1 month ago, he did not have any symptoms.   No  results found. Cataract surgery x2 in November.   Dental: every 6 months.   Alcohol: red wine - 3 bottles per week. Few glasses per day.   Tobacco: none.   Exercise/obesity Body mass index is 34.01 kg/m. Wt Readings from Last 3 Encounters:  10/12/22 250 lb 12.8 oz (113.8 kg)  09/01/22 250 lb 9.6 oz (113.7 kg)  04/25/22 262 lb 3.2 oz (118.9 kg)  Walking 3 miles per day at least 4 days per week, plan for other exercises when spouse retires. Leg sx's improved with exercise.  Lab Results  Component Value Date   HGBA1C 5.5 10/08/2021      History Patient Active Problem List   Diagnosis Date Noted   Calf swelling 02/04/2022   Nerve pain 02/04/2022   HTN (hypertension) 01/18/2012   Insomnia 01/18/2012   Tinnitus 01/18/2012   GERD 05/28/2008   COLONIC POLYPS, ADENOMATOUS, HX OF 05/28/2008   Past Medical History:  Diagnosis Date   Adenomatous polyp of colon    GERD (gastroesophageal reflux disease)    Hypertension    Tinnitus of both ears    Past Surgical History:  Procedure Laterality Date   COLONOSCOPY     KNEE ARTHROSCOPY Left 11/2020   TONSILLECTOMY AND ADENOIDECTOMY  12/19/1960   Allergies  Allergen Reactions   Levofloxacin Hives and Rash    Skin got like leather   Penicillins Rash   Prior to Admission medications   Medication Sig Start Date End Date Taking? Authorizing Provider  aspirin 81 MG EC tablet Take 81 mg by mouth daily. Swallow whole.   Yes [provider]  famotidine-calcium carbonate-magnesium hydroxide (PEPCID COMPLETE) 10-800-165 MG CHEW chewable tablet Chew 1 tablet by mouth daily as needed.   Yes [provider]  lisinopril-hydrochlorothiazide (ZESTORETIC) 10-12.5 MG tablet Take 1 tablet by mouth daily. 04/11/22  Yes GWendie Agreste MD  multivitamin-lutein ([facility] CAPS capsule Take 2 capsules by mouth daily.   Yes [provider]  rosuvastatin (CRESTOR) 5 MG tablet Take 1 tablet (5 mg total) by mouth daily.  04/25/22  Yes AElouise Munroe MD  zolpidem (AMBIEN) 5 MG tablet Take 1 tablet (5 mg total) by mouth at bedtime as needed for sleep. Ok to place on hold if needed. Patient not taking: Reported on 10/12/2022 04/11/22   GWendie Agreste MD   Social History   Socioeconomic History   Marital status: Married    Spouse name: Not on file   Number of children: 3   Years of education: Not on file   Highest education level: Not on file  Occupational History   Occupation: sales    Employer: [employer]  Tobacco Use   Smoking status: Never   Smokeless tobacco: Never  Vaping Use   Vaping Use: Never used  Substance and Sexual Activity  Alcohol use: Yes    Alcohol/week: 14.0 standard drinks of alcohol    Types: 14 Glasses of wine per week   Drug use: No   Sexual activity: Yes    Birth control/protection: None  Other Topics Concern   Not on file  Social History Narrative   Not on file   Social Determinants of Health   Financial Resource Strain: Not on file  Food Insecurity: Not on file  Transportation Needs: Not on file  Physical Activity: Not on file  Stress: Not on file  Social Connections: Not on file  Intimate Partner Violence: Not on file    Review of Systems 13 point review of systems per patient health survey noted.  Negative other than as indicated above or in HPI.    Objective:   Vitals:   10/12/22 0803  BP: 118/60  Pulse: 65  Temp: 97.9 F (36.6 C)  SpO2: 98%  Weight: 250 lb 12.8 oz (113.8 kg)  Height: 6' (1.829 m)     Physical Exam Vitals reviewed.  Constitutional:      Appearance: He is well-developed.  HENT:     Head: Normocephalic and atraumatic.     Right Ear: External ear normal.     Left Ear: External ear normal.  Eyes:     Conjunctiva/sclera: Conjunctivae normal.     Pupils: Pupils are equal, round, and reactive to light.  Neck:     Thyroid: No thyromegaly.  Cardiovascular:     Rate and Rhythm: Normal rate and regular rhythm.     Heart  sounds: Normal heart sounds.  Pulmonary:     Effort: Pulmonary effort is normal. No respiratory distress.     Breath sounds: Normal breath sounds. No wheezing.  Abdominal:     General: There is no distension.     Palpations: Abdomen is soft.     Tenderness: There is no abdominal tenderness.  Musculoskeletal:        General: No tenderness. Normal range of motion.     Cervical back: Normal range of motion and neck supple.  Lymphadenopathy:     Cervical: No cervical adenopathy.  Skin:    General: Skin is warm and dry.  Neurological:     Mental Status: He is alert and oriented to person, place, and time.     Deep Tendon Reflexes: Reflexes are normal and symmetric.  Psychiatric:        Behavior: Behavior normal.        Assessment & Plan:  Warren Hill is a 66 y.o. male . Annual physical exam  - -anticipatory guidance as below in AVS, screening labs above. Health maintenance items as above in HPI discussed/recommended as applicable. Schedule colonoscopy and plans covid booster at pharmacy.   Essential hypertension - Plan: lisinopril-hydrochlorothiazide (ZESTORETIC) 10-12.5 MG tablet  -  Stable, tolerating current regimen. Medications refilled. Labs pending as above.   Screening for prostate cancer - Plan: PSA, Medicare ( [place] only), CANCELED: PSA  Hyperlipidemia, unspecified hyperlipidemia type - Plan: Lipid panel, Comprehensive metabolic panel  - tolerating crestor. Check labs, follow up with cardiology as planned.   Class 1 obesity with serious comorbidity and body mass index (BMI) of 34.0 to 34.9 in adult, unspecified obesity type -   - contineu exercise, watch diet, portions .  Screening for diabetes mellitus - Plan: CMP. A1c nl last year.  Meds ordered this encounter  Medications   lisinopril-hydrochlorothiazide (ZESTORETIC) 10-12.5 MG tablet    Sig: Take 1 tablet by  mouth daily.    Dispense:  90 tablet    Refill:  2   Patient Instructions  Call  gastroenterology as you appear to be due for repeat colonoscopy and ok to have the covid booster. Keep up the good work with exercise. Watch calories and portion sizes.  Take care!  Keeping you healthy  Get these tests Blood pressure- Have your blood pressure checked once a year by your healthcare provider.  Normal blood pressure is 120/80 Weight- Have your body mass index (BMI) calculated to screen for obesity.  BMI is a measure of body fat based on height and weight. You can also calculate your own BMI at [email or web address]. Cholesterol- Have your cholesterol checked every year. Diabetes- Have your blood sugar checked regularly if you have high blood pressure, high cholesterol, have a family history of diabetes or if you are overweight. Screening for Colon Cancer- Colonoscopy starting at age 56.  Screening may begin sooner depending on your family history and other health conditions. Follow up colonoscopy as directed by your Gastroenterologist. Screening for Prostate Cancer- Both blood work (PSA) and a rectal exam help screen for Prostate Cancer.  Screening begins at age 33 with African-American men and at age 48 with Caucasian men.  Screening may begin sooner depending on your family history.  Take these medicines Aspirin- One aspirin daily can help prevent Heart disease and Stroke. Flu shot- Every fall. Tetanus- Every 10 years. Zostavax- Once after the age of 35 to prevent Shingles. Pneumonia shot- Once after the age of 47; if you are younger than 35, ask your healthcare provider if you need a Pneumonia shot.  Take these steps Don't smoke- If you do smoke, talk to your doctor about quitting.  For tips on how to quit, go to www.smokefree.gov or call 1-800-QUIT-NOW. Be physically active- Exercise 5 days a week for at least 30 minutes.  If you are not already physically active start slow and gradually work up to 30 minutes of moderate physical activity.  Examples of moderate activity  include walking briskly, mowing the yard, dancing, swimming, bicycling, etc. Eat a healthy diet- Eat a variety of healthy food such as fruits, vegetables, low fat milk, low fat cheese, yogurt, lean meant, poultry, fish, beans, tofu, etc. For more information go to www.thenutritionsource.org Drink alcohol in moderation- Limit alcohol intake to less than two drinks a day. Never drink and drive. Dentist- Brush and floss twice daily; visit your dentist twice a year. Depression- Your emotional health is as important as your physical health. If you're feeling down, or losing interest in things you would normally enjoy please talk to your healthcare provider. Eye exam- Visit your eye doctor every year. Safe sex- If you may be exposed to a sexually transmitted infection, use a condom. Seat belts- Seat belts can save your life; always wear one. Smoke/Carbon Monoxide detectors- These detectors need to be installed on the appropriate level of your home.  Replace batteries at least once a year. Skin cancer- When out in the sun, cover up and use sunscreen 15 SPF or higher. Violence- If anyone is threatening you, please tell your healthcare provider. Living Will/ Health care power of attorney- Speak with your healthcare provider and family.        Signed,   Merri Ray, MD [place], [facility] 10/12/22 8:31 AM

## 2022-10-12 NOTE — Patient Instructions (Addendum)
Call gastroenterology as you appear to be due for repeat colonoscopy and ok to have the covid booster. Keep up the good work with exercise. Watch calories and portion sizes.  Take care!  Keeping you healthy  Get these tests Blood pressure- Have your blood pressure checked once a year by your healthcare provider.  Normal blood pressure is 120/80 Weight- Have your body mass index (BMI) calculated to screen for obesity.  BMI is a measure of body fat based on height and weight. You can also calculate your own BMI at ViewBanking[employer][email or web address]. Cholesterol- Have your cholesterol checked every year. Diabetes- Have your blood sugar checked regularly if you have high blood pressure, high cholesterol, have a family history of diabetes or if you are overweight. Screening for Colon Cancer- Colonoscopy starting at age 67.  Screening may begin sooner depending on your family history and other health conditions. Follow up colonoscopy as directed by your Gastroenterologist. Screening for Prostate Cancer- Both blood work (PSA) and a rectal exam help screen for Prostate Cancer.  Screening begins at age 20 with African-American men and at age 11 with Caucasian men.  Screening may begin sooner depending on your family history.  Take these medicines Aspirin- One aspirin daily can help prevent Heart disease and Stroke. Flu shot- Every fall. Tetanus- Every 10 years. Zostavax- Once after the age of 69 to prevent Shingles. Pneumonia shot- Once after the age of 54; if you are younger than 89, ask your healthcare provider if you need a Pneumonia shot.  Take these steps Don't smoke- If you do smoke, talk to your doctor about quitting.  For tips on how to quit, go to www.smokefree.gov or call 1-800-QUIT-NOW. Be physically active- Exercise 5 days a week for at least 30 minutes.  If you are not already physically active start slow and gradually work up to 30 minutes of moderate physical activity.  Examples of moderate  activity include walking briskly, mowing the yard, dancing, swimming, bicycling, etc. Eat a healthy diet- Eat a variety of healthy food such as fruits, vegetables, low fat milk, low fat cheese, yogurt, lean meant, poultry, fish, beans, tofu, etc. For more information go to www.thenutritionsource.org Drink alcohol in moderation- Limit alcohol intake to less than two drinks a day. Never drink and drive. Dentist- Brush and floss twice daily; visit your dentist twice a year. Depression- Your emotional health is as important as your physical health. If you're feeling down, or losing interest in things you would normally enjoy please talk to your healthcare provider. Eye exam- Visit your eye doctor every year. Safe sex- If you may be exposed to a sexually transmitted infection, use a condom. Seat belts- Seat belts can save your life; always wear one. Smoke/Carbon Monoxide detectors- These detectors need to be installed on the appropriate level of your home.  Replace batteries at least once a year. Skin cancer- When out in the sun, cover up and use sunscreen 15 SPF or higher. Violence- If anyone is threatening you, please tell your healthcare provider. Living Will/ Health care power of attorney- Speak with your healthcare provider and family.

## 2022-10-17 ENCOUNTER — Other Ambulatory Visit (INDEPENDENT_AMBULATORY_CARE_PROVIDER_SITE_OTHER): Payer: Medicare Other

## 2022-10-17 DIAGNOSIS — E785 Hyperlipidemia, unspecified: Secondary | ICD-10-CM | POA: Diagnosis not present

## 2022-10-17 DIAGNOSIS — Z131 Encounter for screening for diabetes mellitus: Secondary | ICD-10-CM

## 2022-10-17 DIAGNOSIS — Z125 Encounter for screening for malignant neoplasm of prostate: Secondary | ICD-10-CM

## 2022-10-17 LAB — PSA, MEDICARE: PSA: 0.47 ng/ml (ref 0.10–4.00)

## 2022-10-17 LAB — COMPREHENSIVE METABOLIC PANEL
ALT: 24 U/L (ref 0–53)
AST: 25 U/L (ref 0–37)
Albumin: 4.4 g/dL (ref 3.5–5.2)
Alkaline Phosphatase: 55 U/L (ref 39–117)
BUN: 15 mg/dL (ref 6–23)
CO2: 28 mEq/L (ref 19–32)
Calcium: 9.2 mg/dL (ref 8.4–10.5)
Chloride: 101 mEq/L (ref 96–112)
Creatinine, Ser: 0.8 mg/dL (ref 0.40–1.50)
GFR: 91.75 mL/min (ref >60.00)
Glucose, Bld: 78 mg/dL (ref 70–99)
Potassium: 4.6 mEq/L (ref 3.5–5.1)
Sodium: 137 mEq/L (ref 135–145)
Total Bilirubin: 1.4 mg/dL — ABNORMAL HIGH (ref 0.2–1.2)
Total Protein: 6.8 g/dL (ref 6.0–8.3)

## 2022-10-17 LAB — LIPID PANEL
Cholesterol: 110 mg/dL (ref 0–200)
HDL: 40.5 mg/dL (ref >39.00)
LDL Cholesterol: 51 mg/dL (ref 0–99)
NonHDL: 69.44
Total CHOL/HDL Ratio: 3
Triglycerides: 90 mg/dL (ref 0.0–149.0)
VLDL: 18 mg/dL (ref 0.0–40.0)

## 2022-10-27 DIAGNOSIS — H2512 Age-related nuclear cataract, left eye: Secondary | ICD-10-CM | POA: Diagnosis not present

## 2022-10-31 ENCOUNTER — Ambulatory Visit: Payer: Medicare Other | Admitting: Internal Medicine

## 2022-11-03 DIAGNOSIS — L02423 Furuncle of right upper limb: Secondary | ICD-10-CM | POA: Diagnosis not present

## 2022-11-03 DIAGNOSIS — L814 Other melanin hyperpigmentation: Secondary | ICD-10-CM | POA: Diagnosis not present

## 2022-11-03 DIAGNOSIS — L821 Other seborrheic keratosis: Secondary | ICD-10-CM | POA: Diagnosis not present

## 2022-11-03 DIAGNOSIS — D225 Melanocytic nevi of trunk: Secondary | ICD-10-CM | POA: Diagnosis not present

## 2022-11-04 ENCOUNTER — Encounter: Payer: Self-pay | Admitting: Internal Medicine

## 2022-11-04 ENCOUNTER — Ambulatory Visit: Payer: Medicare Other | Attending: Internal Medicine | Admitting: Internal Medicine

## 2022-11-04 VITALS — BP 124/72 | HR 64 | Ht 71.0 in | Wt 249.8 lb

## 2022-11-04 DIAGNOSIS — E782 Mixed hyperlipidemia: Secondary | ICD-10-CM

## 2022-11-04 NOTE — Progress Notes (Signed)
Cardiology Office Note:    Date:  11/04/2022   ID:  Hodari Chuba, DOB 29-Jan-1956, MRN [id number]  PCP:  [name], MD  Cardiologist:  Elouise Munroe, MD  Electrophysiologist:  None   Referring MD: [name], MD   Chief Complaint/Reason for Referral: Follow up bilateral leg pain, HLD, HTN, DOE  History of Present Illness:    Warren Hill is a 66 y.o. male with a history of HTN, GERD, HLD who presents for follow up of DOE and HTN.   Overall doing well. No new concerns. Walking more and feeling well.  The patient denies chest pain, chest pressure, dyspnea at rest or with exertion, palpitations, PND, orthopnea, or leg swelling. Denies cough, fever, chills. Denies nausea, vomiting. Denies syncope or presyncope. Denies dizziness or lightheadedness.  Past Medical History:  Diagnosis Date   Adenomatous polyp of colon    GERD (gastroesophageal reflux disease)    Hypertension    Tinnitus of both ears     Past Surgical History:  Procedure Laterality Date   COLONOSCOPY     KNEE ARTHROSCOPY Left 11/2020   TONSILLECTOMY AND ADENOIDECTOMY  12/19/1960    Current Medications: Current Meds  Medication Sig   aspirin 81 MG EC tablet Take 81 mg by mouth daily. Swallow whole.   famotidine-calcium carbonate-magnesium hydroxide (PEPCID COMPLETE) 10-800-165 MG CHEW chewable tablet Chew 1 tablet by mouth daily as needed.   lisinopril-hydrochlorothiazide (ZESTORETIC) 10-12.5 MG tablet Take 1 tablet by mouth daily.   multivitamin-lutein (OCUVITE-LUTEIN) CAPS capsule Take 2 capsules by mouth daily.   rosuvastatin (CRESTOR) 5 MG tablet Take 1 tablet (5 mg total) by mouth daily.   zolpidem (AMBIEN) 5 MG tablet Take 1 tablet (5 mg total) by mouth at bedtime as needed for sleep. Ok to place on hold if needed.   Current Facility-Administered Medications for the 11/04/22 encounter (Office Visit) with Elouise Munroe, MD  Medication   0.9 %  sodium chloride infusion      Allergies:   Levofloxacin and Penicillins   Social History   Tobacco Use   Smoking status: Never   Smokeless tobacco: Never  Vaping Use   Vaping Use: Never used  Substance Use Topics   Alcohol use: Yes    Alcohol/week: 14.0 standard drinks of alcohol    Types: 14 Glasses of wine per week   Drug use: No     Family History: The patient's family history includes Colon cancer in his maternal grandmother; Colon polyps in his brother, father, and mother; Diabetes in his paternal grandmother; Hypertension in his father and mother; Liver disease in his paternal uncle; Pancreatic cancer in his paternal uncle.  ROS:   Please see the history of present illness.    All other systems reviewed and are negative.  EKGs/Labs/Other Studies Reviewed:    The following studies were reviewed today: [name] LE Doppler 11/02/2020: Right: Resting right ankle-brachial index indicates noncompressible right  lower extremity arteries. The right toe-brachial index is normal.  Left: Resting left ankle-brachial index is within normal range. No  evidence of significant left lower extremity arterial disease. The left  toe-brachial index is normal.   Echo 10/26/2020: 1. Left ventricular ejection fraction, by estimation, is 60 to 65%. The  left ventricle has normal function. The left ventricle has no regional  wall motion abnormalities. There is mild left ventricular hypertrophy.  Left ventricular diastolic parameters  were normal.   2. Right ventricular systolic function is normal. The right ventricular  size  is mildly enlarged. There is normal pulmonary artery systolic  pressure. The estimated right ventricular systolic pressure is 25.9 mmHg.   3. The mitral valve is normal in structure. No evidence of mitral valve  regurgitation.   4. The aortic valve was not well visualized. Aortic valve regurgitation  is trivial. No aortic stenosis is present.   5. Aortic dilatation noted. There is mild dilatation of  the ascending  aorta, measuring 39 mm.   6. The inferior vena cava is normal in size with greater than 50%  respiratory variability, suggesting right atrial pressure of 3 mmHg.   [name] LE Venous Reflux 06/26/2020: Right:  - No evidence of deep vein thrombosis seen in the right lower extremity,  from the common femoral through the popliteal veins.  - No evidence of superficial venous thrombosis in the right lower  extremity.  - Deep vein reflux in the CFV.  - Superficial vein reflux in the SSV, and GSV as above.  Left:  - No evidence of deep vein thrombosis seen in the left lower extremity,  from the common femoral through the popliteal veins.  - No evidence of superficial venous thrombosis in the left lower  extremity.  - Deep vein reflux in the CFV.  - Superficial vein reflux in the SSV, SFJ, and GSV as above.   EKG: 11/08/21: NSR, rate 74 bpm 06/04/2021: NSR, rate 71 bpm 10/08/2020: NSR, rate 68 bpm  Recent Labs: 10/17/2022: ALT 24; BUN 15; Creatinine, Ser 0.80; Potassium 4.6; Sodium 137  Recent Lipid Panel    Component Value Date/Time   CHOL 110 10/17/2022 0805   CHOL 183 03/08/2022 1041   TRIG 90.0 10/17/2022 0805   HDL 40.50 10/17/2022 0805   HDL 46 03/08/2022 1041   CHOLHDL 3 10/17/2022 0805   VLDL 18.0 10/17/2022 0805   LDLCALC 51 10/17/2022 0805   LDLCALC 110 (H) 03/08/2022 1041    Physical Exam:    VS:  BP 124/72   Pulse 64   Ht '5\' 11"'$  (1.803 m)   Wt 249 lb 12.8 oz (113.3 kg)   SpO2 99%   BMI 34.84 kg/m     Wt Readings from Last 5 Encounters:  11/04/22 249 lb 12.8 oz (113.3 kg)  10/12/22 250 lb 12.8 oz (113.8 kg)  09/01/22 250 lb 9.6 oz (113.7 kg)  04/25/22 262 lb 3.2 oz (118.9 kg)  04/11/22 262 lb 9.6 oz (119.1 kg)    Constitutional: No acute distress Eyes: sclera non-icteric, normal conjunctiva and lids ENMT: normal dentition, moist mucous membranes Cardiovascular: regular rhythm, normal rate, no murmurs. S1 and S2 normal. Radial pulses normal  bilaterally. No jugular venous distention.  Respiratory: clear to auscultation bilaterally GI : normal bowel sounds, soft and nontender. No distention.   MSK: extremities warm, well perfused. No edema.  NEURO: grossly nonfocal exam, moves all extremities. PSYCH: alert and oriented x 3, normal mood and affect.   ASSESSMENT:    1. Mixed hyperlipidemia       PLAN:    Mixed hyperlipidemia CAC DOE Med management -LDL and trig at goal, LDL is 51 and Trig 90. On crestor 5 mg daily.  -CCTA showed mild-moderate nonobstructive CAD. Continues on ASA 81 mg daily. Discussed guideline recommendations (II-b) for ASA and recent recommendations for ASA if high risk. Patient can continue ASA if preferred but not required given stable CAD.   Bilateral leg pain - LE arterial studies negative.  Pain improved and has been thoroughly investigated. He will continue to  monitor.  Hypertension, unspecified type - BP well controlled on current therapy, continue lisinopril HCTZ.   Total time of encounter: 30 minutes total time of encounter, including 20 minutes spent in face-to-face patient care on the date of this encounter. This time includes coordination of care and counseling regarding above mentioned problem list. Remainder of non-face-to-face time involved reviewing chart documents/testing relevant to the patient encounter and documentation in the medical record. I have independently reviewed documentation from referring provider.   Cherlynn Kaiser, MD, [facility]     Medication Adjustments/Labs and Tests Ordered: Current medicines are reviewed at length with the patient today.  Concerns regarding medicines are outlined above.   Orders Placed This Encounter  Procedures   EKG 12-Lead     No orders of the defined types were placed in this encounter.    Patient Instructions  Medication Instructions:  No Changes In Medications at this time.  *If you need a refill on  your cardiac medications before your next appointment, please call your pharmacy*  Follow-Up: At [facility], you and your health needs are our priority.  As part of our continuing mission to provide you with exceptional heart care, we have created designated Provider Care Teams.  These Care Teams include your primary Cardiologist (physician) and Advanced Practice Providers (APPs -  Physician Assistants and Nurse Practitioners) who all work together to provide you with the care you need, when you need it.  Your next appointment:   6 month(s)  The format for your next appointment:   In Person  Provider:   Elouise Munroe, MD

## 2022-11-04 NOTE — Patient Instructions (Signed)
Medication Instructions:  No Changes In Medications at this time.  *If you need a refill on your cardiac medications before your next appointment, please call your pharmacy*  Follow-Up: At [facility], you and your health needs are our priority.  As part of our continuing mission to provide you with exceptional heart care, we have created designated Provider Care Teams.  These Care Teams include your primary Cardiologist (physician) and Advanced Practice Providers (APPs -  Physician Assistants and Nurse Practitioners) who all work together to provide you with the care you need, when you need it.  Your next appointment:   6 month(s)  The format for your next appointment:   In Person  Provider:   Gayatri A Acharya, MD          

## 2022-11-14 DIAGNOSIS — H2511 Age-related nuclear cataract, right eye: Secondary | ICD-10-CM | POA: Diagnosis not present

## 2022-11-17 DIAGNOSIS — H2511 Age-related nuclear cataract, right eye: Secondary | ICD-10-CM | POA: Diagnosis not present

## 2022-11-18 HISTORY — PX: CATARACT EXTRACTION: SUR2

## 2022-12-22 DIAGNOSIS — H269 Unspecified cataract: Secondary | ICD-10-CM | POA: Insufficient documentation

## 2022-12-30 DIAGNOSIS — H43392 Other vitreous opacities, left eye: Secondary | ICD-10-CM | POA: Insufficient documentation

## 2023-01-16 DIAGNOSIS — K080 Exfoliation of teeth due to systemic causes: Secondary | ICD-10-CM | POA: Diagnosis not present

## 2023-01-25 ENCOUNTER — Ambulatory Visit (INDEPENDENT_AMBULATORY_CARE_PROVIDER_SITE_OTHER): Payer: Medicare Other

## 2023-01-25 VITALS — Wt 245.0 lb

## 2023-01-25 DIAGNOSIS — Z0000 Encounter for general adult medical examination without abnormal findings: Secondary | ICD-10-CM | POA: Diagnosis not present

## 2023-01-25 NOTE — Patient Instructions (Signed)
Warren Hill , Thank you for taking time to come for your Medicare Wellness Visit. I appreciate your ongoing commitment to your health goals. Please review the following plan we discussed and let me know if I can assist you in the future.   These are the goals we discussed:  Goals      Patient Stated     Continue exercising, try to lose weight        This is a list of the screening recommended for you and due dates:  Health Maintenance  Topic Date Due   COVID-19 Vaccine (5 - 2023-24 season) 08/19/2022   Medicare Annual Wellness Visit  01/26/2024   Colon Cancer Screening  04/19/2024   DTaP/Tdap/Td vaccine (3 - Td or Tdap) 08/19/2032   Pneumonia Vaccine  Completed   Flu Shot  Completed   Hepatitis C Screening: USPSTF Recommendation to screen - Ages 18-79 yo.  Completed   Zoster (Shingles) Vaccine  Completed   HPV Vaccine  Aged Out    Advanced directives: Please bring a copy of your health care power of attorney and living will to the office to be added to your chart at your convenience.   Conditions/risks identified: Aim for 30 minutes of exercise or brisk walking, 6-8 glasses of water, and 5 servings of fruits and vegetables each day.   Next appointment: Follow up in one year for your annual wellness visit.   Preventive Care 19 Years and Older, Male  Preventive care refers to lifestyle choices and visits with your health care provider that can promote health and wellness. What does preventive care include? A yearly physical exam. This is also called an annual well check. Dental exams once or twice a year. Routine eye exams. Ask your health care provider how often you should have your eyes checked. Personal lifestyle choices, including: Daily care of your teeth and gums. Regular physical activity. Eating a healthy diet. Avoiding tobacco and drug use. Limiting alcohol use. Practicing safe sex. Taking low doses of aspirin every day. Taking vitamin and mineral supplements as  recommended by your health care provider. What happens during an annual well check? The services and screenings done by your health care provider during your annual well check will depend on your age, overall health, lifestyle risk factors, and family history of disease. Counseling  Your health care provider may ask you questions about your: Alcohol use. Tobacco use. Drug use. Emotional well-being. Home and relationship well-being. Sexual activity. Eating habits. History of falls. Memory and ability to understand (cognition). Work and work Statistician. Screening  You may have the following tests or measurements: Height, weight, and BMI. Blood pressure. Lipid and cholesterol levels. These may be checked every 5 years, or more frequently if you are over 12 years old. Skin check. Lung cancer screening. You may have this screening every year starting at age 55 if you have a 30-pack-year history of smoking and currently smoke or have quit within the past 15 years. Fecal occult blood test (FOBT) of the stool. You may have this test every year starting at age 47. Flexible sigmoidoscopy or colonoscopy. You may have a sigmoidoscopy every 5 years or a colonoscopy every 10 years starting at age 62. Prostate cancer screening. Recommendations will vary depending on your family history and other risks. Hepatitis C blood test. Hepatitis B blood test. Sexually transmitted disease (STD) testing. Diabetes screening. This is done by checking your blood sugar (glucose) after you have not eaten for a while (fasting). You  may have this done every 1-3 years. Abdominal aortic aneurysm (AAA) screening. You may need this if you are a current or former smoker. Osteoporosis. You may be screened starting at age 18 if you are at high risk. Talk with your health care provider about your test results, treatment options, and if necessary, the need for more tests. Vaccines  Your health care provider may recommend  certain vaccines, such as: Influenza vaccine. This is recommended every year. Tetanus, diphtheria, and acellular pertussis (Tdap, Td) vaccine. You may need a Td booster every 10 years. Zoster vaccine. You may need this after age 3. Pneumococcal 13-valent conjugate (PCV13) vaccine. One dose is recommended after age 34. Pneumococcal polysaccharide (PPSV23) vaccine. One dose is recommended after age 28. Talk to your health care provider about which screenings and vaccines you need and how often you need them. This information is not intended to replace advice given to you by your health care provider. Make sure you discuss any questions you have with your health care provider. Document Released: 01/01/2016 Document Revised: 08/24/2016 Document Reviewed: 10/06/2015 Elsevier Interactive Patient Education  2017 [place] Prevention in the Home Falls can cause injuries. They can happen to people of all ages. There are many things you can do to make your home safe and to help prevent falls. What can I do on the outside of my home? Regularly fix the edges of walkways and driveways and fix any cracks. Remove anything that might make you trip as you walk through a door, such as a raised step or threshold. Trim any bushes or trees on the path to your home. Use bright outdoor lighting. Clear any walking paths of anything that might make someone trip, such as rocks or tools. Regularly check to see if handrails are loose or broken. Make sure that both sides of any steps have handrails. Any raised decks and porches should have guardrails on the edges. Have any leaves, snow, or ice cleared regularly. Use sand or salt on walking paths during winter. Clean up any spills in your garage right away. This includes oil or grease spills. What can I do in the bathroom? Use night lights. Install grab bars by the toilet and in the tub and shower. Do not use towel bars as grab bars. Use non-skid mats or  decals in the tub or shower. If you need to sit down in the shower, use a plastic, non-slip stool. Keep the floor dry. Clean up any water that spills on the floor as soon as it happens. Remove soap buildup in the tub or shower regularly. Attach bath mats securely with double-sided non-slip rug tape. Do not have throw rugs and other things on the floor that can make you trip. What can I do in the bedroom? Use night lights. Make sure that you have a light by your bed that is easy to reach. Do not use any sheets or blankets that are too big for your bed. They should not hang down onto the floor. Have a firm chair that has side arms. You can use this for support while you get dressed. Do not have throw rugs and other things on the floor that can make you trip. What can I do in the kitchen? Clean up any spills right away. Avoid walking on wet floors. Keep items that you use a lot in easy-to-reach places. If you need to reach something above you, use a strong step stool that has a grab bar. Keep electrical  cords out of the way. Do not use floor polish or wax that makes floors slippery. If you must use wax, use non-skid floor wax. Do not have throw rugs and other things on the floor that can make you trip. What can I do with my stairs? Do not leave any items on the stairs. Make sure that there are handrails on both sides of the stairs and use them. Fix handrails that are broken or loose. Make sure that handrails are as long as the stairways. Check any carpeting to make sure that it is firmly attached to the stairs. Fix any carpet that is loose or worn. Avoid having throw rugs at the top or bottom of the stairs. If you do have throw rugs, attach them to the floor with carpet tape. Make sure that you have a light switch at the top of the stairs and the bottom of the stairs. If you do not have them, ask someone to add them for you. What else can I do to help prevent falls? Wear shoes that: Do not  have high heels. Have rubber bottoms. Are comfortable and fit you well. Are closed at the toe. Do not wear sandals. If you use a stepladder: Make sure that it is fully opened. Do not climb a closed stepladder. Make sure that both sides of the stepladder are locked into place. Ask someone to hold it for you, if possible. Clearly mark and make sure that you can see: Any grab bars or handrails. First and last steps. Where the edge of each step is. Use tools that help you move around (mobility aids) if they are needed. These include: Canes. Walkers. Scooters. Crutches. Turn on the lights when you go into a dark area. Replace any light bulbs as soon as they burn out. Set up your furniture so you have a clear path. Avoid moving your furniture around. If any of your floors are uneven, fix them. If there are any pets around you, be aware of where they are. Review your medicines with your doctor. Some medicines can make you feel dizzy. This can increase your chance of falling. Ask your doctor what other things that you can do to help prevent falls. This information is not intended to replace advice given to you by your health care provider. Make sure you discuss any questions you have with your health care provider. Document Released: 10/01/2009 Document Revised: 05/12/2016 Document Reviewed: 01/09/2015 Elsevier Interactive Patient Education  2017 [employer].

## 2023-01-25 NOTE — Progress Notes (Signed)
Subjective:   Warren Hill is a 67 y.o. male who presents for Medicare Annual/Subsequent preventive examination.   Patient Medicare AWV questionnaire was completed by the patient on 01/20/23; I have confirmed that all information answered by patient is correct and no changes since this date.     I connected with  [name] on 01/25/23 by a audio enabled telemedicine application and verified that I am speaking with the correct person using two identifiers.  Patient Location: Home  Provider Location: Home Office  I discussed the limitations of evaluation and management by telemedicine. The patient expressed understanding and agreed to proceed.   Review of Systems     Cardiac Risk Factors include: advanced age (>47mn, >>12women);hypertension;male gender;obesity (BMI >30kg/m2)     Objective:    Today's Vitals   01/25/23 0907  Weight: 245 lb (111.1 kg)   Body mass index is 34.17 kg/m.     01/25/2023    9:10 AM 10/08/2021    8:11 AM 09/09/2020    1:22 PM 06/26/2020    2:02 PM 03/08/2018    3:19 PM 04/07/2017   10:42 AM  Advanced Directives  Does Patient Have a Medical Advance Directive? Yes Yes Yes Yes Yes Yes  Type of AParamedicof [place]iberaLiving will   [place]abrook BeachLiving will Living will [place]Living will  Does patient want to make changes to medical advance directive?  Yes (ED - send information to MCementon Yes (ED - Information included in AVS) No - Patient declined No - Patient declined   Copy of HWhiteman AFBin Chart? No - copy requested     No - copy requested    Current Medications (verified) Outpatient Encounter Medications as of 01/25/2023  Medication Sig   aspirin 81 MG EC tablet Take 81 mg by mouth daily. Swallow whole.   famotidine-calcium carbonate-magnesium hydroxide (PEPCID COMPLETE) 10-800-165 MG CHEW chewable tablet Chew 1 tablet by mouth daily as needed.    lisinopril-hydrochlorothiazide (ZESTORETIC) 10-12.5 MG tablet Take 1 tablet by mouth daily.   multivitamin-lutein (OCUVITE-LUTEIN) CAPS capsule Take 2 capsules by mouth daily.   rosuvastatin (CRESTOR) 5 MG tablet Take 1 tablet (5 mg total) by mouth daily.   zolpidem (AMBIEN) 5 MG tablet Take 1 tablet (5 mg total) by mouth at bedtime as needed for sleep. Ok to place on hold if needed.   Facility-Administered Encounter Medications as of 01/25/2023  Medication   0.9 %  sodium chloride infusion    Allergies (verified) Levofloxacin and Penicillins   History: Past Medical History:  Diagnosis Date   Adenomatous polyp of colon    GERD (gastroesophageal reflux disease)    Hypertension    Tinnitus of both ears    Past Surgical History:  Procedure Laterality Date   COLONOSCOPY     KNEE ARTHROSCOPY Left 11/2020   TONSILLECTOMY AND ADENOIDECTOMY  12/19/1960   Family History  Problem Relation Age of Onset   Colon polyps Mother    Hypertension Mother    Colon polyps Father    Hypertension Father    Pancreatic cancer Paternal Uncle    Liver disease Paternal Uncle    Colon cancer Maternal Grandmother    Colon polyps Brother    Diabetes Paternal Grandmother    Social History   Socioeconomic History   Marital status: Married    Spouse name: Not on file   Number of children: 3   Years of education: Not on file  Highest education level: Not on file  Occupational History   Occupation: Scientist, clinical (histocompatibility and immunogenetics): [employer]  Tobacco Use   Smoking status: Never   Smokeless tobacco: Never  Vaping Use   Vaping Use: Never used  Substance and Sexual Activity   Alcohol use: Yes    Alcohol/week: 14.0 standard drinks of alcohol    Types: 14 Glasses of wine per week   Drug use: No   Sexual activity: Yes    Birth control/protection: None  Other Topics Concern   Not on file  Social History Narrative   Not on file   Social Determinants of Health   Financial Resource Strain: Low Risk   (01/25/2023)   Overall Financial Resource Strain (CARDIA)    Difficulty of Paying Living Expenses: Not hard at all  Food Insecurity: No Food Insecurity (01/25/2023)   Hunger Vital Sign    Worried About Running Out of Food in the Last Year: Never true    Ran Out of Food in the Last Year: Never true  Transportation Needs: No Transportation Needs (01/25/2023)   PRAPARE - Hydrologist (Medical): No    Lack of Transportation (Non-Medical): No  Physical Activity: Sufficiently Active (01/25/2023)   Exercise Vital Sign    Days of Exercise per Week: 5 days    Minutes of Exercise per Session: 50 min  Stress: No Stress Concern Present (01/25/2023)   [place]    Feeling of Stress : Only a little  Social Connections: Socially Integrated (01/25/2023)   Social Connection and Isolation Panel [NHANES]    Frequency of Communication with Friends and Family: More than three times a week    Frequency of Social Gatherings with Friends and Family: Once a week    Attends Religious Services: More than 4 times per year    Active Member of [employer] or Organizations: Yes    Attends Music therapist: More than 4 times per year    Marital Status: Married    Tobacco Counseling Counseling given: Not Answered   Clinical Intake:  Pre-visit preparation completed: Yes  Pain : No/denies pain     BMI - recorded: 34.17 Nutritional Status: BMI > 30  Obese Nutritional Risks: None Diabetes: No  How often do you need to have someone help you when you read instructions, pamphlets, or other written materials from your doctor or pharmacy?: 1 - Never  Diabetic? no  Interpreter Needed?: No  Information entered by :: Richmond Coldren, LPN   Activities of Daily Living    01/25/2023    9:21 AM 01/20/2023    3:13 PM  In your present state of health, do you have any difficulty performing the following activities:  Hearing? 1  1  Comment wears hearing aids   Vision? 0 0  Difficulty concentrating or making decisions? 0 0  Walking or climbing stairs? 0 0  Dressing or bathing? 0 0  Doing errands, shopping? 0 0  Preparing Food and eating ? N N  Using the Toilet? N N  In the past six months, have you accidently leaked urine? N N  Do you have problems with loss of bowel control? N N  Managing your Medications? N N  Managing your Finances? N N  Housekeeping or managing your Housekeeping? N N    Patient Care Team: [name], MD as PCP - General (Family Medicine) Elouise Munroe, MD as PCP - Cardiology (  Cardiology) Lorenda Peck, DPM as Consulting Physician (Podiatry) Waynetta Sandy, MD as Consulting Physician (Vascular Surgery)  Indicate any recent Medical Services you may have received from other than [facility] providers in the past year (date may be approximate).     Assessment:   This is a routine wellness examination for Warren Hill.  Hearing/Vision screen Hearing Screening - Comments:: Wear hearing aids - routine f/u with [facility] Vision Screening - Comments:: Wears rx glasses - up to date with routine eye exams with  [facility]  Dietary issues and exercise activities discussed: Current Exercise Habits: Home exercise routine, Type of exercise: walking, Time (Minutes): 50, Frequency (Times/Week): 5, Weekly Exercise (Minutes/Week): 250, Intensity: Moderate, Exercise limited by: None identified   Goals Addressed             This Visit's Progress    Patient Stated       Continue exercising, try to lose weight       Depression Screen    01/25/2023    9:11 AM 10/12/2022    8:01 AM 09/01/2022    7:56 AM 04/11/2022    8:40 AM 02/04/2022    9:42 AM 10/08/2021    8:14 AM 05/19/2021   10:09 AM  PHQ 2/9 Scores  PHQ - 2 Score 0 0 0 0 0 1 2  PHQ- 9 Score  '1 3 2 2 5 5    '$ Fall Risk    01/25/2023    9:08 AM 01/20/2023    3:13 PM 10/12/2022    8:01 AM 09/01/2022    7:56 AM 04/11/2022    8:40 AM   Fall Risk   Falls in the past year? 0 0 0 0 0  Number falls in past yr: 0  0 0 0  Injury with Fall? 0  0 0 0  Risk for fall due to : No Fall Risks  No Fall Risks No Fall Risks No Fall Risks  Follow up Falls prevention discussed  Falls evaluation completed Falls evaluation completed     [place]:  Any stairs in or around the home? Yes  If so, are there any without handrails? No  Home free of loose throw rugs in walkways, pet beds, electrical cords, etc? Yes  Adequate lighting in your home to reduce risk of falls? Yes   ASSISTIVE DEVICES UTILIZED TO PREVENT FALLS:  Life alert? No  Use of a cane, walker or w/c? No  Grab bars in the bathroom? No  Shower chair or bench in shower? No  Elevated toilet seat or a handicapped toilet? No   TIMED UP AND GO:  Was the test performed? No . Telephonic visit  Cognitive Function:        01/25/2023    9:11 AM 10/08/2021    8:09 AM 09/09/2020    1:13 PM  6CIT Screen  What Year? 0 points 0 points 0 points  What month? 0 points 0 points 0 points  What time? 0 points 0 points 0 points  Count back from 20 0 points 0 points 2 points  Months in reverse 0 points 0 points 0 points  Repeat phrase 0 points 0 points 2 points  Total Score 0 points 0 points 4 points    Immunizations Immunization History  Administered Date(s) Administered   Fluad Quad(high Dose 65+) 08/19/2022   Influenza Split 09/22/2012   Influenza, Quadrivalent, Recombinant, Inj, Pf 08/26/2018   Influenza,inj,Quad PF,6+ Mos 08/06/2014, 10/16/2015, 10/08/2016, 08/26/2017, 08/17/2019   Moderna  Sars-Covid-2 Vaccination 03/09/2020, 04/06/2020, 12/04/2020   PNEUMOCOCCAL CONJUGATE-20 04/11/2022   Pfizer Covid-19 Vaccine Bivalent Booster 38yr & up 10/08/2021   Pneumococcal Polysaccharide-23 09/09/2020   Tdap 12/19/2010, 08/19/2022   Zoster Recombinat (Shingrix) 07/07/2019, 07/31/2019    TDAP status: Up to date  Flu Vaccine status: Up to  date  Pneumococcal vaccine status: Up to date  Covid-19 vaccine status: Completed vaccines  Qualifies for Shingles Vaccine? Yes   Zostavax completed No   Shingrix Completed?: Yes  Screening Tests Health Maintenance  Topic Date Due   COVID-19 Vaccine (5 - 2023-24 season) 08/19/2022   Medicare Annual Wellness (AWV)  01/26/2024   COLONOSCOPY (Pts 45-460yrInsurance coverage will need to be confirmed)  04/19/2024   DTaP/Tdap/Td (3 - Td or Tdap) 08/19/2032   Pneumonia Vaccine 6569Years old  Completed   INFLUENZA VACCINE  Completed   Hepatitis C Screening  Completed   Zoster Vaccines- Shingrix  Completed   HPV VACCINES  Aged Out    Health Maintenance  Health Maintenance Due  Topic Date Due   COVID-19 Vaccine (5 - 2023-24 season) 08/19/2022    Colorectal cancer screening: Type of screening: Colonoscopy. Completed 04/19/17. Repeat every 7 years (pt verified with GI)  Lung Cancer Screening: (Low Dose CT Chest recommended if Age 485-80ears, 30 pack-year currently smoking OR have quit w/in 15years.) does not qualify.  Additional Screening:  Hepatitis C Screening: does qualify; Completed 10/16/2015  Vision Screening: Recommended annual ophthalmology exams for early detection of glaucoma and other disorders of the eye. Is the patient up to date with their annual eye exam?  Yes  Who is the provider or what is the name of the office in which the patient attends annual eye exams? [facility] If pt is not established with a provider, would they like to be referred to a provider to establish care? No .   Dental Screening: Recommended annual dental exams for proper oral hygiene  Community Resource Referral / Chronic Care Management: CRR required this visit?  No   CCM required this visit?  No      Plan:     I have personally reviewed and noted the following in the patient's chart:   Medical and social history Use of alcohol, tobacco or illicit drugs  Current medications and  supplements including opioid prescriptions. Patient is not currently taking opioid prescriptions. Functional ability and status Nutritional status Physical activity Advanced directives List of other physicians Hospitalizations, surgeries, and ER visits in previous 12 months Vitals Screenings to include cognitive, depression, and falls Referrals and appointments  In addition, I have reviewed and discussed with patient certain preventive protocols, quality metrics, and best practice recommendations. A written personalized care plan for preventive services as well as general preventive health recommendations were provided to patient.     AmSandrea HammondLPN   2/01/19/3085 Nurse Notes: none  Due to this being a telephonic visit, the after visit summary with patients personalized plan was offered to patient via mail or my-chart. Patient would like to access on my-chart.

## 2023-02-28 DIAGNOSIS — K080 Exfoliation of teeth due to systemic causes: Secondary | ICD-10-CM | POA: Diagnosis not present

## 2023-04-05 DIAGNOSIS — H0102A Squamous blepharitis right eye, upper and lower eyelids: Secondary | ICD-10-CM | POA: Diagnosis not present

## 2023-04-05 DIAGNOSIS — H0102B Squamous blepharitis left eye, upper and lower eyelids: Secondary | ICD-10-CM | POA: Diagnosis not present

## 2023-04-05 DIAGNOSIS — H43812 Vitreous degeneration, left eye: Secondary | ICD-10-CM | POA: Diagnosis not present

## 2023-04-05 DIAGNOSIS — H353132 Nonexudative age-related macular degeneration, bilateral, intermediate dry stage: Secondary | ICD-10-CM | POA: Diagnosis not present

## 2023-04-13 ENCOUNTER — Encounter: Payer: Self-pay | Admitting: Family Medicine

## 2023-04-13 ENCOUNTER — Ambulatory Visit (INDEPENDENT_AMBULATORY_CARE_PROVIDER_SITE_OTHER): Payer: Medicare Other | Admitting: Family Medicine

## 2023-04-13 VITALS — BP 118/53 | HR 61 | Temp 98.8°F | Ht 71.0 in | Wt 238.2 lb

## 2023-04-13 DIAGNOSIS — M25562 Pain in left knee: Secondary | ICD-10-CM | POA: Diagnosis not present

## 2023-04-13 DIAGNOSIS — I10 Essential (primary) hypertension: Secondary | ICD-10-CM

## 2023-04-13 DIAGNOSIS — E785 Hyperlipidemia, unspecified: Secondary | ICD-10-CM | POA: Diagnosis not present

## 2023-04-13 DIAGNOSIS — G4700 Insomnia, unspecified: Secondary | ICD-10-CM | POA: Diagnosis not present

## 2023-04-13 LAB — LIPID PANEL
Cholesterol: 106 mg/dL (ref 0–200)
HDL: 43 mg/dL (ref >39.00)
LDL Cholesterol: 46 mg/dL (ref 0–99)
NonHDL: 63.22
Total CHOL/HDL Ratio: 2
Triglycerides: 86 mg/dL (ref 0.0–149.0)
VLDL: 17.2 mg/dL (ref 0.0–40.0)

## 2023-04-13 LAB — COMPREHENSIVE METABOLIC PANEL
ALT: 21 U/L (ref 0–53)
AST: 25 U/L (ref 0–37)
Albumin: 4.6 g/dL (ref 3.5–5.2)
Alkaline Phosphatase: 49 U/L (ref 39–117)
BUN: 14 mg/dL (ref 6–23)
CO2: 30 mEq/L (ref 19–32)
Calcium: 9.9 mg/dL (ref 8.4–10.5)
Chloride: 100 mEq/L (ref 96–112)
Creatinine, Ser: 0.87 mg/dL (ref 0.40–1.50)
GFR: 89.15 mL/min (ref >60.00)
Glucose, Bld: 85 mg/dL (ref 70–99)
Potassium: 4.2 mEq/L (ref 3.5–5.1)
Sodium: 138 mEq/L (ref 135–145)
Total Bilirubin: 1.4 mg/dL — ABNORMAL HIGH (ref 0.2–1.2)
Total Protein: 7.6 g/dL (ref 6.0–8.3)

## 2023-04-13 MED ORDER — LISINOPRIL 10 MG PO TABS: 10.0000 mg | ORAL_TABLET | Freq: Every day | ORAL | 3 refills | Status: DC

## 2023-04-13 MED ORDER — MELOXICAM 7.5 MG PO TABS: 7.5000 mg | ORAL_TABLET | Freq: Every day | ORAL | 0 refills | Status: DC | PRN

## 2023-04-13 MED ORDER — ROSUVASTATIN CALCIUM 5 MG PO TABS: 5.0000 mg | ORAL_TABLET | Freq: Every day | ORAL | 3 refills | Status: DC

## 2023-04-13 MED ORDER — ZOLPIDEM TARTRATE 5 MG PO TABS: 5.0000 mg | ORAL_TABLET | Freq: Every evening | ORAL | 0 refills | Status: DC | PRN

## 2023-04-13 NOTE — Patient Instructions (Addendum)
Blood pressure looks great, change to just lisinopril. Keep a record of your blood pressures outside of the office and if over 130/80 - let me know. This change should help nighttime urination. Follow up if not.   No change in cholesterol med, Ambien as needed, refilled.  Continue exercise, stretching after exercise may be helpful.  If you have some persistent knee pain, occasional meloxicam once per day is okay, do not exceed 7 to 10 days of that medication.  If you require it longer or persistent knee issues would recommend follow-up with your orthopedist.  Let me know if I can help in the meantime.

## 2023-04-13 NOTE — Progress Notes (Signed)
Subjective:  Patient ID: Warren Hill, male    DOB: May 07, 1956  Age: 67 y.o. MRN: [id number]  CC:  Chief Complaint  Patient presents with   Hyperlipidemia   Hypertension    Pt states he wants his ears checked for wax    Knee Pain    Pt complains of left knee pain x3 months     HPI Blayde Bacigalupi presents for     Hypertension: Lisinopril HCTZ 10/12.5 mg daily - taking at night.   Weight has improved, down from 249-238 from November. More exercise, improved diet and less alcohol. No new med side effects. Some nocturia at times at night.  Home readings: none.  BP Readings from Last 3 Encounters:  04/13/23 (!) 118/53  11/04/22 124/72  10/12/22 118/60   Lab Results  Component Value Date   CREATININE 0.80 10/17/2022   Wt Readings from Last 3 Encounters:  04/13/23 238 lb 3.2 oz (108 kg)  01/25/23 245 lb (111.1 kg)  11/04/22 249 lb 12.8 oz (113.3 kg)   Hyperlipidemia: Crestor 5 mg daily. No new side effects/myalgias. Fasting.  Lab Results  Component Value Date   CHOL 110 10/17/2022   HDL 40.50 10/17/2022   LDLCALC 51 10/17/2022   TRIG 90.0 10/17/2022   CHOLHDL 3 10/17/2022   Lab Results  Component Value Date   ALT 24 10/17/2022   AST 25 10/17/2022   ALKPHOS 55 10/17/2022   BILITOT 1.4 (H) 10/17/2022    Left knee pain Past few months.  History of arthroscopy in 2021, Dr. Thurston Hole.  More walking - 3 miles per day. mile.  Past few months sore then better. Min pain walking, then after walking has some soreness. No meds needed.  No locking/giving way. Not limiting activity, sometimes sore on last mile.  Left knee XR in 2021: No significant degenerative change. No appreciable joint effusion and adjacent soft tissues are unremarkable.   IMPRESSION: Negative.   Insomnia Situational, infrequent use of Ambien, no parasomnias  or new side effects. Possibly 1-2 every month or 2.  Controlled substance database reviewed.   Last prescription for #15 in April  2023  History Patient Active Problem List   Diagnosis Date Noted   Calf swelling 02/04/2022   Nerve pain 02/04/2022   HTN (hypertension) 01/18/2012   Insomnia 01/18/2012   Tinnitus 01/18/2012   GERD 05/28/2008   COLONIC POLYPS, ADENOMATOUS, HX OF 05/28/2008   Past Medical History:  Diagnosis Date   Adenomatous polyp of colon    GERD (gastroesophageal reflux disease)    Hypertension    Tinnitus of both ears    Past Surgical History:  Procedure Laterality Date   COLONOSCOPY     KNEE ARTHROSCOPY Left 11/2020   TONSILLECTOMY AND ADENOIDECTOMY  12/19/1960   Allergies  Allergen Reactions   Levofloxacin Hives and Rash    Skin got like leather   Penicillins Rash   Prior to Admission medications   Medication Sig Start Date End Date Taking? Authorizing Provider  lisinopril-hydrochlorothiazide (ZESTORETIC) 10-12.5 MG tablet Take 1 tablet by mouth daily. 10/12/22  Yes Shade Flood, MD  multivitamin-lutein [facility]) CAPS capsule Take 2 capsules by mouth daily.   Yes [provider]  rosuvastatin (CRESTOR) 5 MG tablet Take 1 tablet (5 mg total) by mouth daily. 04/25/22  Yes Parke Poisson, MD  zolpidem (AMBIEN) 5 MG tablet Take 1 tablet (5 mg total) by mouth at bedtime as needed for sleep. Ok to place on hold if needed. 04/11/22  Yes Shade Flood, MD  aspirin 81 MG EC tablet Take 81 mg by mouth daily. Swallow whole.    [provider]  famotidine-calcium carbonate-magnesium hydroxide (PEPCID COMPLETE) 10-800-165 MG CHEW chewable tablet Chew 1 tablet by mouth daily as needed.    [provider]   Social History   Socioeconomic History   Marital status: Married    Spouse name: Not on file   Number of children: 3   Years of education: Not on file   Highest education level: Bachelor's degree (e.g., BA, AB, BS)  Occupational History   Occupation: Investment banker, corporate: [employer]  Tobacco Use   Smoking status: Never   Smokeless tobacco:  Never  Vaping Use   Vaping Use: Never used  Substance and Sexual Activity   Alcohol use: Yes    Alcohol/week: 14.0 standard drinks of alcohol    Types: 14 Glasses of wine per week   Drug use: No   Sexual activity: Yes    Birth control/protection: None  Other Topics Concern   Not on file  Social History Narrative   Not on file   Social Determinants of Health   Financial Resource Strain: Low Risk  (04/12/2023)   Overall Financial Resource Strain (CARDIA)    Difficulty of Paying Living Expenses: Not hard at all  Food Insecurity: No Food Insecurity (04/12/2023)   Hunger Vital Sign    Worried About Running Out of Food in the Last Year: Never true    Ran Out of Food in the Last Year: Never true  Transportation Needs: No Transportation Needs (04/12/2023)   PRAPARE - Administrator, Civil Service (Medical): No    Lack of Transportation (Non-Medical): No  Physical Activity: Sufficiently Active (04/12/2023)   Exercise Vital Sign    Days of Exercise per Week: 6 days    Minutes of Exercise per Session: 50 min  Stress: No Stress Concern Present (04/12/2023)   [employer] of Occupational Health - Occupational Stress Questionnaire    Feeling of Stress : Only a little  Social Connections: Socially Integrated (04/12/2023)   Social Connection and Isolation Panel [NHANES]    Frequency of Communication with Friends and Family: More than three times a week    Frequency of Social Gatherings with Friends and Family: Once a week    Attends Religious Services: More than 4 times per year    Active Member of [employer] or Organizations: Yes    Attends Engineer, structural: More than 4 times per year    Marital Status: Married  Catering manager Violence: Not At Risk (01/25/2023)   Humiliation, Afraid, Rape, and Kick questionnaire    Fear of Current or Ex-Partner: No    Emotionally Abused: No    Physically Abused: No    Sexually Abused: No    Review of Systems  Constitutional:   Negative for fatigue and unexpected weight change.  Eyes:  Negative for visual disturbance.  Respiratory:  Negative for cough, chest tightness and shortness of breath.   Cardiovascular:  Negative for chest pain, palpitations and leg swelling.  Gastrointestinal:  Negative for abdominal pain and blood in stool.  Neurological:  Negative for dizziness, light-headedness and headaches.     Objective:   Vitals:   04/13/23 0843  BP: (!) 118/53  Pulse: 61  Temp: 98.8 F (37.1 C)  TempSrc: Temporal  SpO2: 100%  Weight: 238 lb 3.2 oz (108 kg)  Height:  (1.803 m)  Physical Exam Vitals reviewed.  Constitutional:      Appearance: He is well-developed.  HENT:     Head: Normocephalic and atraumatic.     Right Ear: There is no impacted cerumen.     Left Ear: There is no impacted cerumen.  Neck:     Vascular: No carotid bruit or JVD.  Cardiovascular:     Rate and Rhythm: Normal rate and regular rhythm.     Heart sounds: Normal heart sounds. No murmur heard. Pulmonary:     Effort: Pulmonary effort is normal.     Breath sounds: Normal breath sounds. No rales.  Musculoskeletal:     Right lower leg: No edema.     Left lower leg: No edema.     Comments: Left knee, full range of motion, pain-free range of motion, no appreciable effusion.  No focal bony tenderness or joint line tenderness.  Negative drawer, [place].  Skin:    General: Skin is warm and dry.  Neurological:     Mental Status: He is alert and oriented to person, place, and time.  Psychiatric:        Mood and Affect: Mood normal.        Assessment & Plan:  Humberto Addo is a 67 y.o. male . Essential hypertension - Plan: Comprehensive metabolic panel, lisinopril (ZESTRIL) 10 MG tablet  -Well-controlled, commended on improved weight.  Will stop combination lisinopril HCTZ, and that may also help his nighttime urination with RTC precautions if not.  Prescription for just lisinopril 10 mg for now with home blood  pressure monitoring, RTC precautions.  Hyperlipidemia, unspecified hyperlipidemia type - Plan: Lipid panel  -Tolerating Crestor, continue same.  Check lipids with adjustment and plan accordingly  Insomnia, unspecified type - Plan: zolpidem (AMBIEN) 5 MG tablet  -Infrequent need for Ambien without new side effects, refilled.  Left knee pain, unspecified chronicity - Plan: meloxicam (MOBIC) 7.5 MG tablet  -Reassuring exam, possible fatigue of muscles after walking.  Stretching after exercise recommended, episodic meloxicam if needed but RTC precautions or follow-up with Ortho if persistent need or new/worsening symptoms.  Meds ordered this encounter  Medications   zolpidem (AMBIEN) 5 MG tablet    Sig: Take 1 tablet (5 mg total) by mouth at bedtime as needed for sleep.    Dispense:  15 tablet    Refill:  0   meloxicam (MOBIC) 7.5 MG tablet    Sig: Take 1 tablet (7.5 mg total) by mouth daily as needed for pain (knee pain).    Dispense:  30 tablet    Refill:  0   lisinopril (ZESTRIL) 10 MG tablet    Sig: Take 1 tablet (10 mg total) by mouth daily.    Dispense:  90 tablet    Refill:  3   rosuvastatin (CRESTOR) 5 MG tablet    Sig: Take 1 tablet (5 mg total) by mouth daily.    Dispense:  90 tablet    Refill:  3   Patient Instructions  Blood pressure looks great, change to just lisinopril. Keep a record of your blood pressures outside of the office and if over 130/80 - let me know. This change should help nighttime urination. Follow up if not.   No change in cholesterol med, Ambien as needed, refilled.  Continue exercise, stretching after exercise may be helpful.  If you have some persistent knee pain, occasional meloxicam once per day is okay, do not exceed 7 to 10 days of that medication.  If you  require it longer or persistent knee issues would recommend follow-up with your orthopedist.  Let me know if I can help in the meantime.     Signed,   Meredith Staggers, MD [facility], [facility] 04/13/23 9:30 AM

## 2023-04-14 ENCOUNTER — Encounter: Payer: Self-pay | Admitting: Internal Medicine

## 2023-05-18 ENCOUNTER — Ambulatory Visit: Payer: Medicare Other | Attending: Internal Medicine | Admitting: Internal Medicine

## 2023-05-18 VITALS — BP 112/82 | HR 58 | Ht 71.0 in | Wt 243.2 lb

## 2023-05-18 DIAGNOSIS — I10 Essential (primary) hypertension: Secondary | ICD-10-CM | POA: Diagnosis not present

## 2023-05-18 DIAGNOSIS — R931 Abnormal findings on diagnostic imaging of heart and coronary circulation: Secondary | ICD-10-CM | POA: Diagnosis not present

## 2023-05-18 DIAGNOSIS — M79604 Pain in right leg: Secondary | ICD-10-CM

## 2023-05-18 DIAGNOSIS — M79605 Pain in left leg: Secondary | ICD-10-CM

## 2023-05-18 DIAGNOSIS — I2510 Atherosclerotic heart disease of native coronary artery without angina pectoris: Secondary | ICD-10-CM | POA: Diagnosis not present

## 2023-05-18 DIAGNOSIS — E782 Mixed hyperlipidemia: Secondary | ICD-10-CM | POA: Diagnosis not present

## 2023-05-18 NOTE — Progress Notes (Signed)
Cardiology Office Note:    Date:  05/18/2023   ID:  Warren Hill, DOB 26-Dec-1955, MRN [id number]  PCP:  Shade Flood, MD  Cardiologist:  Parke Poisson, MD  Electrophysiologist:  None   Referring MD: Shade Flood, MD   Chief Complaint/Reason for Referral: Follow up bilateral leg pain, HLD, HTN, DOE  History of Present Illness:    Warren Hill is a 67 y.o. male with a history of HTN, GERD, HLD who presents for follow up of DOE and HTN.   Overall doing well. No new concerns. Walking more and feeling well.  Due to weight loss and improved BP, HCTZ stopped.   The patient denies chest pain, chest pressure, dyspnea at rest or with exertion, palpitations, PND, orthopnea, or leg swelling. Denies cough, fever, chills. Denies nausea, vomiting. Denies syncope or presyncope. Denies dizziness or lightheadedness.  Past Medical History:  Diagnosis Date   Adenomatous polyp of colon    GERD (gastroesophageal reflux disease)    Hypertension    Tinnitus of both ears     Past Surgical History:  Procedure Laterality Date   COLONOSCOPY     KNEE ARTHROSCOPY Left 11/2020   TONSILLECTOMY AND ADENOIDECTOMY  12/19/1960    Current Medications: Current Meds  Medication Sig   lisinopril (ZESTRIL) 10 MG tablet Take 1 tablet (10 mg total) by mouth daily.   meloxicam (MOBIC) 7.5 MG tablet Take 1 tablet (7.5 mg total) by mouth daily as needed for pain (knee pain).   multivitamin-lutein (OCUVITE-LUTEIN) CAPS capsule Take 2 capsules by mouth daily.   rosuvastatin (CRESTOR) 5 MG tablet Take 1 tablet (5 mg total) by mouth daily.   zolpidem (AMBIEN) 5 MG tablet Take 1 tablet (5 mg total) by mouth at bedtime as needed for sleep.   Current Facility-Administered Medications for the 05/18/23 encounter (Office Visit) with Parke Poisson, MD  Medication   0.9 %  sodium chloride infusion     Allergies:   Levofloxacin and Penicillins   Social History   Tobacco Use   Smoking status: Never    Smokeless tobacco: Never  Vaping Use   Vaping Use: Never used  Substance Use Topics   Alcohol use: Yes    Alcohol/week: 14.0 standard drinks of alcohol    Types: 14 Glasses of wine per week   Drug use: No     Family History: The patient's family history includes Colon cancer in his maternal grandmother; Colon polyps in his brother, father, and mother; Diabetes in his paternal grandmother; Hypertension in his father and mother; Liver disease in his paternal uncle; Pancreatic cancer in his paternal uncle.  ROS:   Please see the history of present illness.    All other systems reviewed and are negative.  EKGs/Labs/Other Studies Reviewed:    The following studies were reviewed today: [name] LE Doppler 11/02/2020: Right: Resting right ankle-brachial index indicates noncompressible right  lower extremity arteries. The right toe-brachial index is normal.  Left: Resting left ankle-brachial index is within normal range. No  evidence of significant left lower extremity arterial disease. The left  toe-brachial index is normal.   Echo 10/26/2020: 1. Left ventricular ejection fraction, by estimation, is 60 to 65%. The  left ventricle has normal function. The left ventricle has no regional  wall motion abnormalities. There is mild left ventricular hypertrophy.  Left ventricular diastolic parameters  were normal.   2. Right ventricular systolic function is normal. The right ventricular  size is mildly enlarged. There is normal  pulmonary artery systolic  pressure. The estimated right ventricular systolic pressure is 23.2 mmHg.   3. The mitral valve is normal in structure. No evidence of mitral valve  regurgitation.   4. The aortic valve was not well visualized. Aortic valve regurgitation  is trivial. No aortic stenosis is present.   5. Aortic dilatation noted. There is mild dilatation of the ascending  aorta, measuring 39 mm.   6. The inferior vena cava is normal in size with greater than 50%   respiratory variability, suggesting right atrial pressure of 3 mmHg.   [name] LE Venous Reflux 06/26/2020: Right:  - No evidence of deep vein thrombosis seen in the right lower extremity,  from the common femoral through the popliteal veins.  - No evidence of superficial venous thrombosis in the right lower  extremity.  - Deep vein reflux in the CFV.  - Superficial vein reflux in the SSV, and GSV as above.  Left:  - No evidence of deep vein thrombosis seen in the left lower extremity,  from the common femoral through the popliteal veins.  - No evidence of superficial venous thrombosis in the left lower  extremity.  - Deep vein reflux in the CFV.  - Superficial vein reflux in the SSV, SFJ, and GSV as above.   EKG: 05/08/23: sinus bradycardia. 11/17:23: NSR 11/08/21: NSR, rate 74 bpm 06/04/2021: NSR, rate 71 bpm 10/08/2020: NSR, rate 68 bpm  Recent Labs: 04/13/2023: ALT 21; BUN 14; Creatinine, Ser 0.87; Potassium 4.2; Sodium 138  Recent Lipid Panel    Component Value Date/Time   CHOL 106 04/13/2023 0942   CHOL 183 03/08/2022 1041   TRIG 86.0 04/13/2023 0942   HDL 43.00 04/13/2023 0942   HDL 46 03/08/2022 1041   CHOLHDL 2 04/13/2023 0942   VLDL 17.2 04/13/2023 0942   LDLCALC 46 04/13/2023 0942   LDLCALC 110 (H) 03/08/2022 1041    Physical Exam:    VS:  BP 112/82   Pulse (!) 58   Ht 5\' 11"  (1.803 m)   Wt 243 lb 3.2 oz (110.3 kg)   SpO2 98%   BMI 33.92 kg/m     Wt Readings from Last 5 Encounters:  05/18/23 243 lb 3.2 oz (110.3 kg)  04/13/23 238 lb 3.2 oz (108 kg)  01/25/23 245 lb (111.1 kg)  11/04/22 249 lb 12.8 oz (113.3 kg)  10/12/22 250 lb 12.8 oz (113.8 kg)    Constitutional: No acute distress Eyes: sclera non-icteric, normal conjunctiva and lids ENMT: normal dentition, moist mucous membranes Cardiovascular: regular rhythm, normal rate, no murmurs. S1 and S2 normal. Radial pulses normal bilaterally. No jugular venous distention.  Respiratory: clear to  auscultation bilaterally GI : normal bowel sounds, soft and nontender. No distention.   MSK: extremities warm, well perfused. No edema.  NEURO: grossly nonfocal exam, moves all extremities. PSYCH: alert and oriented x 3, normal mood and affect.   ASSESSMENT:    1. Mixed hyperlipidemia   2. Elevated coronary artery calcium score   3. Coronary artery disease involving native coronary artery of native heart without angina pectoris   4. Hypertension, unspecified type   5. Bilateral leg pain      PLAN:    Mixed hyperlipidemia CAC/mild-mod CAD DOE Med management -LDL and trig at goal, LDL is 46 and Trig 86. On crestor 5 mg daily.  -CCTA showed mild-moderate nonobstructive CAD. Discussed guideline recommendations (II-b) for ASA and recent recommendations for ASA if high risk. Patient can continue ASA if preferred  but not required given stable CAD.  Patient discussed with primary care doctor and has since stopped ASA 81 mg daily.  Bilateral leg pain - LE arterial studies negative.  Pain improved and has been thoroughly investigated. He will continue to monitor.  Improves with massage and exercise.  Hypertension, unspecified type - BP well controlled on current therapy, continue lisinopril.  HCTZ stopped by PCP due to improved blood pressure and weight.  Total time of encounter: 20 minutes total time of encounter, including 15 minutes spent in face-to-face patient care on the date of this encounter. This time includes coordination of care and counseling regarding above mentioned problem list. Remainder of non-face-to-face time involved reviewing chart documents/testing relevant to the patient encounter and documentation in the medical record. I have independently reviewed documentation from referring provider.   Weston Brass, MD, [facility] [place]  [facility]      Medication Adjustments/Labs and Tests Ordered: Current medicines are reviewed at length with the patient today.   Concerns regarding medicines are outlined above.   Orders Placed This Encounter  Procedures   EKG 12-Lead     No orders of the defined types were placed in this encounter.    Patient Instructions  Medication Instructions:  No Changes In Medications at this time.   *If you need a refill on your cardiac medications before your next appointment, please call your pharmacy*  Follow-Up: At [facility], you and your health needs are our priority.  As part of our continuing mission to provide you with exceptional heart care, we have created designated Provider Care Teams.  These Care Teams include your primary Cardiologist (physician) and Advanced Practice Providers (APPs -  Physician Assistants and Nurse Practitioners) who all work together to provide you with the care you need, when you need it.  Your next appointment:   6 month(s)  Provider:   Parke Poisson, MD

## 2023-05-18 NOTE — Patient Instructions (Signed)
Medication Instructions:  No Changes In Medications at this time.  *If you need a refill on your cardiac medications before your next appointment, please call your pharmacy*  Follow-Up: At [facility], you and your health needs are our priority.  As part of our continuing mission to provide you with exceptional heart care, we have created designated Provider Care Teams.  These Care Teams include your primary Cardiologist (physician) and Advanced Practice Providers (APPs -  Physician Assistants and Nurse Practitioners) who all work together to provide you with the care you need, when you need it.  Your next appointment:   6 month(s)  Provider:   Gayatri A Acharya, MD    

## 2023-07-26 DIAGNOSIS — K080 Exfoliation of teeth due to systemic causes: Secondary | ICD-10-CM | POA: Diagnosis not present

## 2023-07-28 DIAGNOSIS — H43391 Other vitreous opacities, right eye: Secondary | ICD-10-CM | POA: Insufficient documentation

## 2023-08-03 DIAGNOSIS — H43811 Vitreous degeneration, right eye: Secondary | ICD-10-CM | POA: Diagnosis not present

## 2023-08-03 DIAGNOSIS — H353132 Nonexudative age-related macular degeneration, bilateral, intermediate dry stage: Secondary | ICD-10-CM | POA: Diagnosis not present

## 2023-08-03 DIAGNOSIS — H0102A Squamous blepharitis right eye, upper and lower eyelids: Secondary | ICD-10-CM | POA: Diagnosis not present

## 2023-08-03 DIAGNOSIS — H43812 Vitreous degeneration, left eye: Secondary | ICD-10-CM | POA: Diagnosis not present

## 2023-08-07 ENCOUNTER — Encounter: Payer: Self-pay | Admitting: Family Medicine

## 2023-08-07 ENCOUNTER — Ambulatory Visit (INDEPENDENT_AMBULATORY_CARE_PROVIDER_SITE_OTHER): Payer: Medicare Other | Admitting: Family Medicine

## 2023-08-07 VITALS — BP 130/78 | HR 65 | Temp 97.9°F | Ht 71.0 in | Wt 245.2 lb

## 2023-08-07 DIAGNOSIS — M25562 Pain in left knee: Secondary | ICD-10-CM | POA: Diagnosis not present

## 2023-08-07 DIAGNOSIS — Z7184 Encounter for health counseling related to travel: Secondary | ICD-10-CM

## 2023-08-07 MED ORDER — MELOXICAM 7.5 MG PO TABS
7.5000 mg | ORAL_TABLET | Freq: Every day | ORAL | 0 refills | Status: DC | PRN
Start: 2023-08-07 — End: 2024-04-08

## 2023-08-07 NOTE — Patient Instructions (Addendum)
I will refer you to prior ortho to decide if injection or other treatment or bracing may be helpful.  Xray: [facility] or xray: Walk in 8:30-4:30 during weekdays, no appointment needed [address]  [place], [place] [address]  Updated covid booster recommended.  In regards to traveler's diarrhea, mild symptoms do not need to be treated with antibiotics, as there are some risk with antibiotics including with some of the causes of that traveler's diarrhea.  If you have severe diarrhea, can take the antibiotic but then would recommend medical care as well.  Azithromycin can be taken if needed.  That can also be used for ear infection if needed.

## 2023-08-07 NOTE — Progress Notes (Signed)
Subjective:  Patient ID: Warren Hill, male    DOB: 1955/12/31  Age: 67 y.o. MRN: [id number]  CC:  Chief Complaint  Patient presents with   Knee Pain    Notes when he does a lot of physical activity he gets soreness, notes a brace helps with this, bothering pt for about 2 weeks    Travel Consult    Pt is traveling to [place] and [place] in September 15th-28th     HPI Warren Hill presents for   L knee pain: Past 2 weeks.  No known injury.  Noticed increased discomfort with physical activity, wearing brace (neoprene sleeve) has been helpful.  Has seen Ortho previously with left knee arthroscopy in 2021. Dr. Thurston Hole.  Meloxicam has been used previously - out.  Sore on outside of knee after mowing lawn. No recent injury. Stiff with prolonged car travel, or after exercise.  Will be walking more with travel.   Travel consult Travel to [place], [place], September 15 through 28. Having travel vaccines through travel clinic.  Hep A, B, japanese encephalitis, typhoid.  Would like med for travelers diarrhea - has leftover azithromycin if needed.  Covid vaccine booster - 2022. Recommended updated booster.    Immunization History  Administered Date(s) Administered   Fluad Quad(high Dose 65+) 08/19/2022   Hep A / Hep B 07/28/2023   Influenza Split 09/22/2012   Influenza, Quadrivalent, Recombinant, Inj, Pf 08/26/2018   Influenza,inj,Quad PF,6+ Mos 08/06/2014, 10/16/2015, 10/08/2016, 08/26/2017, 08/17/2019   Influenza-Unspecified 08/04/2023   JAPANESE ENCEPHALITIS IM 07/28/2023   Moderna Sars-Covid-2 Vaccination 03/09/2020, 04/06/2020, 12/04/2020   PNEUMOCOCCAL CONJUGATE-20 04/11/2022   Pfizer Covid-19 Vaccine Bivalent Booster 60yrs & up 10/08/2021   Pneumococcal Polysaccharide-23 09/09/2020   Tdap 12/19/2010, 08/19/2022   Typhoid Live 06/23/2023   Zoster Recombinant(Shingrix) 07/07/2019, 07/31/2019    History Patient Active Problem List   Diagnosis Date Noted   Calf swelling 02/04/2022    Nerve pain 02/04/2022   HTN (hypertension) 01/18/2012   Insomnia 01/18/2012   Tinnitus 01/18/2012   GERD 05/28/2008   COLONIC POLYPS, ADENOMATOUS, HX OF 05/28/2008   Past Medical History:  Diagnosis Date   Adenomatous polyp of colon    GERD (gastroesophageal reflux disease)    Hypertension    Tinnitus of both ears    Past Surgical History:  Procedure Laterality Date   COLONOSCOPY     KNEE ARTHROSCOPY Left 11/2020   TONSILLECTOMY AND ADENOIDECTOMY  12/19/1960   Allergies  Allergen Reactions   Levofloxacin Hives and Rash    Skin got like leather   Penicillins Rash   Prior to Admission medications   Medication Sig Start Date End Date Taking? Authorizing Provider  lisinopril (ZESTRIL) 10 MG tablet Take 1 tablet (10 mg total) by mouth daily. 04/13/23  Yes Shade Flood, MD  multivitamin-lutein [facility]) CAPS capsule Take 2 capsules by mouth daily.   Yes [provider]  rosuvastatin (CRESTOR) 5 MG tablet Take 1 tablet (5 mg total) by mouth daily. 04/13/23  Yes Shade Flood, MD  meloxicam (MOBIC) 7.5 MG tablet Take 1 tablet (7.5 mg total) by mouth daily as needed for pain (knee pain). Patient not taking: Reported on 08/07/2023 04/13/23   Shade Flood, MD  zolpidem (AMBIEN) 5 MG tablet Take 1 tablet (5 mg total) by mouth at bedtime as needed for sleep. Patient not taking: Reported on 08/07/2023 04/13/23   Shade Flood, MD   Social History   Socioeconomic History   Marital status: Married  Spouse name: Not on file   Number of children: 3   Years of education: Not on file   Highest education level: Bachelor's degree (e.g., BA, AB, BS)  Occupational History   Occupation: Investment banker, corporate: [employer]  Tobacco Use   Smoking status: Never   Smokeless tobacco: Never  Vaping Use   Vaping status: Never Used  Substance and Sexual Activity   Alcohol use: Yes    Alcohol/week: 14.0 standard drinks of alcohol    Types: 14 Glasses of wine per week    Drug use: No   Sexual activity: Yes    Birth control/protection: None  Other Topics Concern   Not on file  Social History Narrative   Not on file   Social Determinants of Health   Financial Resource Strain: Low Risk  (04/12/2023)   Overall Financial Resource Strain (CARDIA)    Difficulty of Paying Living Expenses: Not hard at all  Food Insecurity: No Food Insecurity (04/12/2023)   Hunger Vital Sign    Worried About Running Out of Food in the Last Year: Never true    Ran Out of Food in the Last Year: Never true  Transportation Needs: No Transportation Needs (04/12/2023)   PRAPARE - Administrator, Civil Service (Medical): No    Lack of Transportation (Non-Medical): No  Physical Activity: Sufficiently Active (04/12/2023)   Exercise Vital Sign    Days of Exercise per Week: 6 days    Minutes of Exercise per Session: 50 min  Stress: No Stress Concern Present (04/12/2023)   [employer] of Occupational Health - Occupational Stress Questionnaire    Feeling of Stress : Only a little  Social Connections: Socially Integrated (04/12/2023)   Social Connection and Isolation Panel [NHANES]    Frequency of Communication with Friends and Family: More than three times a week    Frequency of Social Gatherings with Friends and Family: Once a week    Attends Religious Services: More than 4 times per year    Active Member of [employer] or Organizations: Yes    Attends Banker Meetings: More than 4 times per year    Marital Status: Married  Catering manager Violence: Not At Risk (01/25/2023)   Humiliation, Afraid, Rape, and Kick questionnaire    Fear of Current or Ex-Partner: No    Emotionally Abused: No    Physically Abused: No    Sexually Abused: No    Review of Systems Per HPI.   Objective:   Vitals:   08/07/23 0933  BP: 130/78  Pulse: 65  Temp: 97.9 F (36.6 C)  TempSrc: Temporal  SpO2: 98%  Weight: 245 lb 3.2 oz (111.2 kg)  Height: 5\' 11"  (1.803 m)      Physical Exam Vitals reviewed.  Constitutional:      General: He is not in acute distress.    Appearance: Normal appearance. He is well-developed.  HENT:     Head: Normocephalic and atraumatic.  Cardiovascular:     Rate and Rhythm: Normal rate.  Pulmonary:     Effort: Pulmonary effort is normal.  Musculoskeletal:     Comments: Left knee, skin intact, intact range of motion.  Medial and lateral joint line slightly tender.  No appreciable effusion.  Minimal discomfort with [place].  Negative drawer.  Slight discomfort with varus/valgus stress testing.  Ambulating without assistive device.  Skin:    General: Skin is warm and dry.     Findings: No erythema or rash.  Neurological:     Mental Status: He is alert and oriented to person, place, and time.  Psychiatric:        Mood and Affect: Mood normal.     Assessment & Plan:  Kainin Hosp is a 67 y.o. male . Travel advice encounter  -Status post evaluation and vaccination with travel clinic locally.  Has azithromycin, still with planned date.  Can be used for signs of otitis media or significant traveler's diarrhea but recommended no specific treatment for mild symptoms.  Potential side effects and risks of antibiotics with traveler's diarrhea discussed.  Left knee pain, unspecified chronicity - Plan: DG Knee 1-2 Views Left, Ambulatory referral to Orthopedic Surgery, meloxicam (MOBIC) 7.5 MG tablet  -Medial joint line discomfort, intermittent.  Possible meniscal pathology versus degenerative disease.  Check imaging above, short-term meloxicam and refer to Ortho to consider injection versus other bracing versus advanced imaging although unlikely given current exam.  Meds ordered this encounter  Medications   meloxicam (MOBIC) 7.5 MG tablet    Sig: Take 1 tablet (7.5 mg total) by mouth daily as needed for pain (knee pain).    Dispense:  30 tablet    Refill:  0   Patient Instructions  I will refer you to prior ortho to decide  if injection or other treatment or bracing may be helpful.  Xray: [facility] or xray: Walk in 8:30-4:30 during weekdays, no appointment needed [address]  [place], [place] [address]  Updated covid booster recommended.  In regards to traveler's diarrhea, mild symptoms do not need to be treated with antibiotics, as there are some risk with antibiotics including with some of the causes of that traveler's diarrhea.  If you have severe diarrhea, can take the antibiotic but then would recommend medical care as well.  Azithromycin can be taken if needed.  That can also be used for ear infection if needed.     Signed,   Meredith Staggers, MD [facility], [facility] 08/07/23 10:16 AM

## 2023-08-11 ENCOUNTER — Ambulatory Visit (INDEPENDENT_AMBULATORY_CARE_PROVIDER_SITE_OTHER)
Admission: RE | Admit: 2023-08-11 | Discharge: 2023-08-11 | Disposition: A | Discharge status: Home / Self Care | Payer: Medicare Other | Source: Ambulatory Visit | Attending: Family Medicine | Admitting: Family Medicine

## 2023-08-11 ENCOUNTER — Encounter: Payer: Self-pay | Admitting: Podiatry

## 2023-08-11 ENCOUNTER — Ambulatory Visit: Payer: Medicare Other | Admitting: Podiatry

## 2023-08-11 DIAGNOSIS — S99929A Unspecified injury of unspecified foot, initial encounter: Secondary | ICD-10-CM

## 2023-08-11 DIAGNOSIS — M25562 Pain in left knee: Secondary | ICD-10-CM | POA: Diagnosis not present

## 2023-08-11 DIAGNOSIS — G8929 Other chronic pain: Secondary | ICD-10-CM | POA: Diagnosis not present

## 2023-08-11 DIAGNOSIS — M7989 Other specified soft tissue disorders: Secondary | ICD-10-CM | POA: Diagnosis not present

## 2023-08-11 NOTE — Progress Notes (Signed)
  Subjective:  Patient ID: Warren Hill, male    DOB: 1956/03/19,   MRN: [id number]  Chief Complaint  Patient presents with   Nail Problem    Pt present today for a  toenail discoloration.     67 y.o. male presents for right great toe discoloration. Relates toe had been doing well from prior visit but about a couple months ago noticed some pain in the toe but did go away subsequently noticed the toenail had become discolored. He is going to [place] soon and wanted to make sure he wasn't going to have any issues  . Denies any other pedal complaints. Denies n/v/f/c.   Past Medical History:  Diagnosis Date   Adenomatous polyp of colon    GERD (gastroesophageal reflux disease)    Hypertension    Tinnitus of both ears     Objective:  Physical Exam: Vascular: DP/PT pulses 2/4 bilateral. CFT <3 seconds. Normal hair growth on digits. No edema.  Skin. Mild hematoma and hemorrhage underlying distal medial nail bed on right great toe. No pain to palpation. Nail well adhered.  Musculoskeletal: MMT 5/5 bilateral lower extremities in DF, PF, Inversion and Eversion. Deceased ROM in DF of ankle joint. Tenderness over fourth metatarsophalangeal joint. No tenderness to wound area.  Neurological: Sensation intact to light touch.   Assessment:   1. Injury of great toenail      Plan:  Patient was evaluated and treated and all questions answered.  -Discussed treatement options for contusion of toe ; risks, alternatives, and benefits explained. -Discussed nail will likely continue to grow out and discoloration will grow out as well.  -No need for removal today.  -Discussed nail may potentially fall off and if any trouble to return.  Patient to return as needed.   Louann Sjogren, DPM

## 2023-08-14 ENCOUNTER — Ambulatory Visit: Payer: Medicare Other | Admitting: Family Medicine

## 2023-08-23 ENCOUNTER — Other Ambulatory Visit: Payer: Self-pay

## 2023-08-23 ENCOUNTER — Telehealth: Payer: Self-pay | Admitting: Family Medicine

## 2023-08-23 NOTE — Telephone Encounter (Signed)
Caller name: Mars Burczyk  On DPR?: Yes  Call back number: [phone] (mobile)  Provider they see: Shade Flood, MD  Reason for call:   [facility] in [place] is saying they need PA for Hep A/B combo per insurance.

## 2023-08-23 NOTE — Telephone Encounter (Signed)
Pt needs an order for the vaccine sent to the [facility], I attempted order with no success can you try or write one on Rx pad? Please and thank you

## 2023-08-24 NOTE — Telephone Encounter (Signed)
Immunization History  Administered Date(s) Administered   Fluad Quad(high Dose 65+) 08/19/2022   Hep A / Hep B 07/28/2023   Influenza Split 09/22/2012   Influenza, Quadrivalent, Recombinant, Inj, Pf 08/26/2018   Influenza,inj,Quad PF,6+ Mos 08/06/2014, 10/16/2015, 10/08/2016, 08/26/2017, 08/17/2019   Influenza-Unspecified 08/04/2023   JAPANESE ENCEPHALITIS IM 07/28/2023   Moderna Sars-Covid-2 Vaccination 03/09/2020, 04/06/2020, 12/04/2020   PNEUMOCOCCAL CONJUGATE-20 04/11/2022   Pfizer Covid-19 Vaccine Bivalent Booster 29yrs & up 10/08/2021   Pneumococcal Polysaccharide-23 09/09/2020   Tdap 12/19/2010, 08/19/2022   Typhoid Live 06/23/2023   Zoster Recombinant(Shingrix) 07/07/2019, 07/31/2019   Looks like he had Hep A/B combo vaccine on 07/28/23 - repeat at 1 month and 6 months.  My understanding at his August 19 visit is that he was having the vaccines through the travel medicine clinic.  I do not mind prescribing the Twinrix vaccine and have placed a prescription in the fax folder, but should repeat dose at 6 months for full course.

## 2023-08-25 ENCOUNTER — Other Ambulatory Visit: Payer: Self-pay

## 2023-08-25 DIAGNOSIS — I10 Essential (primary) hypertension: Secondary | ICD-10-CM

## 2023-08-25 NOTE — Telephone Encounter (Signed)
Caller name: Kamarrion Schoessow  On DPR?: Yes  Call back number: [phone] (mobile)  Provider they see: Shade Flood, MD  Reason for call:   Tell Thea Silversmith is everything fine

## 2023-08-25 NOTE — Telephone Encounter (Signed)
Pt has been informed.

## 2023-08-25 NOTE — Telephone Encounter (Signed)
Called to confirm still wanted to the [facility] in [place], I did fax to that location just in case they are unable to return call today but can send else where if preference has changed

## 2023-08-28 DIAGNOSIS — M25562 Pain in left knee: Secondary | ICD-10-CM | POA: Diagnosis not present

## 2023-10-13 DIAGNOSIS — H353132 Nonexudative age-related macular degeneration, bilateral, intermediate dry stage: Secondary | ICD-10-CM | POA: Diagnosis not present

## 2023-10-13 DIAGNOSIS — H43811 Vitreous degeneration, right eye: Secondary | ICD-10-CM | POA: Diagnosis not present

## 2023-10-13 DIAGNOSIS — H0102A Squamous blepharitis right eye, upper and lower eyelids: Secondary | ICD-10-CM | POA: Diagnosis not present

## 2023-10-13 DIAGNOSIS — H43812 Vitreous degeneration, left eye: Secondary | ICD-10-CM | POA: Diagnosis not present

## 2023-10-18 ENCOUNTER — Encounter: Payer: Self-pay | Admitting: Family Medicine

## 2023-10-18 ENCOUNTER — Ambulatory Visit (INDEPENDENT_AMBULATORY_CARE_PROVIDER_SITE_OTHER): Payer: Medicare Other | Admitting: Family Medicine

## 2023-10-18 VITALS — BP 122/70 | HR 62 | Temp 97.6°F | Ht 71.25 in | Wt 246.2 lb

## 2023-10-18 DIAGNOSIS — E785 Hyperlipidemia, unspecified: Secondary | ICD-10-CM

## 2023-10-18 DIAGNOSIS — Z0000 Encounter for general adult medical examination without abnormal findings: Secondary | ICD-10-CM | POA: Diagnosis not present

## 2023-10-18 DIAGNOSIS — I10 Essential (primary) hypertension: Secondary | ICD-10-CM

## 2023-10-18 DIAGNOSIS — R739 Hyperglycemia, unspecified: Secondary | ICD-10-CM

## 2023-10-18 DIAGNOSIS — Z131 Encounter for screening for diabetes mellitus: Secondary | ICD-10-CM | POA: Diagnosis not present

## 2023-10-18 DIAGNOSIS — Z125 Encounter for screening for malignant neoplasm of prostate: Secondary | ICD-10-CM | POA: Diagnosis not present

## 2023-10-18 LAB — COMPREHENSIVE METABOLIC PANEL
ALT: 21 U/L (ref 0–53)
AST: 20 U/L (ref 0–37)
Albumin: 4.3 g/dL (ref 3.5–5.2)
Alkaline Phosphatase: 53 U/L (ref 39–117)
BUN: 15 mg/dL (ref 6–23)
CO2: 29 meq/L (ref 19–32)
Calcium: 9.5 mg/dL (ref 8.4–10.5)
Chloride: 105 meq/L (ref 96–112)
Creatinine, Ser: 0.85 mg/dL (ref 0.40–1.50)
GFR: 89.45 mL/min (ref >60.00)
Glucose, Bld: 90 mg/dL (ref 70–99)
Potassium: 4.6 meq/L (ref 3.5–5.1)
Sodium: 140 meq/L (ref 135–145)
Total Bilirubin: 1 mg/dL (ref 0.2–1.2)
Total Protein: 7 g/dL (ref 6.0–8.3)

## 2023-10-18 LAB — LIPID PANEL
Cholesterol: 128 mg/dL (ref 0–200)
HDL: 47.6 mg/dL (ref >39.00)
LDL Cholesterol: 58 mg/dL (ref 0–99)
NonHDL: 80.51
Total CHOL/HDL Ratio: 3
Triglycerides: 114 mg/dL (ref 0.0–149.0)
VLDL: 22.8 mg/dL (ref 0.0–40.0)

## 2023-10-18 LAB — CBC WITH DIFFERENTIAL/PLATELET
Basophils Absolute: 0 10*3/uL (ref 0.0–0.1)
Basophils Relative: 0.7 % (ref 0.0–3.0)
Eosinophils Absolute: 0.1 10*3/uL (ref 0.0–0.7)
Eosinophils Relative: 1 % (ref 0.0–5.0)
HCT: 45.2 % (ref 39.0–52.0)
Hemoglobin: 14.7 g/dL (ref 13.0–17.0)
Lymphocytes Relative: 24.1 % (ref 12.0–46.0)
Lymphs Abs: 1.6 10*3/uL (ref 0.7–4.0)
MCHC: 32.4 g/dL (ref 30.0–36.0)
MCV: 96.3 fL (ref 78.0–100.0)
Monocytes Absolute: 0.6 10*3/uL (ref 0.1–1.0)
Monocytes Relative: 8.6 % (ref 3.0–12.0)
Neutro Abs: 4.3 10*3/uL (ref 1.4–7.7)
Neutrophils Relative %: 65.6 % (ref 43.0–77.0)
Platelets: 137 10*3/uL — ABNORMAL LOW (ref 150.0–400.0)
RBC: 4.7 Mil/uL (ref 4.22–5.81)
RDW: 13.9 % (ref 11.5–15.5)
WBC: 6.6 10*3/uL (ref 4.0–10.5)

## 2023-10-18 LAB — PSA, MEDICARE: PSA: 0.63 ng/mL (ref 0.10–4.00)

## 2023-10-18 LAB — HEMOGLOBIN A1C: Hgb A1c MFr Bld: 5.4 % (ref 4.6–6.5)

## 2023-10-18 NOTE — Patient Instructions (Signed)
Thanks for coming in today.  Tell the family hello.  No med changes at this time.  If any concerns on your labs I will let you know.  Take care  Preventive Care 33 Years and Older, Male Preventive care refers to lifestyle choices and visits with your health care provider that can promote health and wellness. Preventive care visits are also called wellness exams. What can I expect for my preventive care visit? Counseling During your preventive care visit, your health care provider may ask about your: Medical history, including: Past medical problems. Family medical history. History of falls. Current health, including: Emotional well-being. Home life and relationship well-being. Sexual activity. Memory and ability to understand (cognition). Lifestyle, including: Alcohol, nicotine or tobacco, and drug use. Access to firearms. Diet, exercise, and sleep habits. Work and work Astronomer. Sunscreen use. Safety issues such as seatbelt and bike helmet use. Physical exam Your health care provider will check your: Height and weight. These may be used to calculate your BMI (body mass index). BMI is a measurement that tells if you are at a healthy weight. Waist circumference. This measures the distance around your waistline. This measurement also tells if you are at a healthy weight and may help predict your risk of certain diseases, such as type 2 diabetes and high blood pressure. Heart rate and blood pressure. Body temperature. Skin for abnormal spots. What immunizations do I need?  Vaccines are usually given at various ages, according to a schedule. Your health care provider will recommend vaccines for you based on your age, medical history, and lifestyle or other factors, such as travel or where you work. What tests do I need? Screening Your health care provider may recommend screening tests for certain conditions. This may include: Lipid and cholesterol levels. Diabetes screening. This  is done by checking your blood sugar (glucose) after you have not eaten for a while (fasting). Hepatitis C test. Hepatitis B test. HIV (human immunodeficiency virus) test. STI (sexually transmitted infection) testing, if you are at risk. Lung cancer screening. Colorectal cancer screening. Prostate cancer screening. Abdominal aortic aneurysm (AAA) screening. You may need this if you are a current or former smoker. Talk with your health care provider about your test results, treatment options, and if necessary, the need for more tests. Follow these instructions at home: Eating and drinking  Eat a diet that includes fresh fruits and vegetables, whole grains, lean protein, and low-fat dairy products. Limit your intake of foods with high amounts of sugar, saturated fats, and salt. Take vitamin and mineral supplements as recommended by your health care provider. Do not drink alcohol if your health care provider tells you not to drink. If you drink alcohol: Limit how much you have to 0-2 drinks a day. Know how much alcohol is in your drink. In the [place] one drink equals one 12 oz bottle of beer (355 mL), one 5 oz glass of wine (148 mL), or one 1 oz glass of hard liquor (44 mL). Lifestyle Brush your teeth every morning and night with fluoride toothpaste. Floss one time each day. Exercise for at least 30 minutes 5 or more days each week. Do not use any products that contain nicotine or tobacco. These products include cigarettes, chewing tobacco, and vaping devices, such as e-cigarettes. If you need help quitting, ask your health care provider. Do not use drugs. If you are sexually active, practice safe sex. Use a condom or other form of protection to prevent STIs. Take aspirin only as  told by your health care provider. Make sure that you understand how much to take and what form to take. Work with your health care provider to find out whether it is safe and beneficial for you to take aspirin  daily. Ask your health care provider if you need to take a cholesterol-lowering medicine (statin). Find healthy ways to manage stress, such as: Meditation, yoga, or listening to music. Journaling. Talking to a trusted person. Spending time with friends and family. Safety Always wear your seat belt while driving or riding in a vehicle. Do not drive: If you have been drinking alcohol. Do not ride with someone who has been drinking. When you are tired or distracted. While texting. If you have been using any mind-altering substances or drugs. Wear a helmet and other protective equipment during sports activities. If you have firearms in your house, make sure you follow all gun safety procedures. Minimize exposure to UV radiation to reduce your risk of skin cancer. What's next? Visit your health care provider once a year for an annual wellness visit. Ask your health care provider how often you should have your eyes and teeth checked. Stay up to date on all vaccines. This information is not intended to replace advice given to you by your health care provider. Make sure you discuss any questions you have with your health care provider. Document Revised: 06/02/2021 Document Reviewed: 06/02/2021 Elsevier Patient Education  2024 [employer].

## 2023-10-18 NOTE — Progress Notes (Signed)
Subjective:  Patient ID: Warren Hill, male    DOB: [birth year]  Age: 67 y.o. MRN: [id number]  CC:  Chief Complaint  Patient presents with   Annual Exam    Pt is doing well and is fasting this morning     HPI Warren Hill presents for Annual Exam PCP, me Cardiology, Dr. Jacques [facility], Dr. Randie Heinz Ophthalmology, Dr. Marchelle Gearing Podiatry, Dr. Tawanna Cooler - [facility] - Dr. Dion Saucier. Knee pain, uses brace, mobic as needed - few days at a time.   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 Low pressure.   Hypertension: Lisinopril 10 mg daily, no new side effects.  Home readings: stable.  BP Readings from Last 3 Encounters:  10/18/23 122/70  08/07/23 130/78  05/18/23 112/82   Lab Results  Component Value Date   CREATININE 0.87 04/13/2023   Hyperlipidemia: Crestor 5 mg daily, no new myalgias/side effects.  Lab Results  Component Value Date   CHOL 106 04/13/2023   HDL 43.00 04/13/2023   LDLCALC 46 04/13/2023   TRIG 86.0 04/13/2023   CHOLHDL 2 04/13/2023   Lab Results  Component Value Date   ALT 21 04/13/2023   AST 25 04/13/2023   ALKPHOS 49 04/13/2023   BILITOT 1.4 (H) 04/13/2023   Intermittent insomnia Primarily travel, intermittent requirement for Ambien.  Denies any parasomnias or side effects.  Ambien 5 mg #15 last filled on 04/13/2023. No recent need.       10/18/2023    8:24 AM 08/07/2023    9:29 AM 04/13/2023    8:45 AM 01/25/2023    9:11 AM 10/12/2022    8:01 AM  Depression screen PHQ 2/9  Decreased Interest 0 0 0 0 0  Down, Depressed, Hopeless 0 1 0 0 0  PHQ - 2 Score 0 1 0 0 0  Altered sleeping 1 1 0  0  Tired, decreased energy 1 1 1  1   Change in appetite 1 0 0  0  Feeling bad or failure about yourself  0 0 0  0  Trouble concentrating 0 0 0  0  Moving slowly or fidgety/restless 0 0 0  0  Suicidal thoughts 0 0 0  0  PHQ-9 Score 3 3 1  1   Difficult doing work/chores   Not difficult at all      Health Maintenance  Topic Date  Due   COVID-19 Vaccine (6 - 2023-24 season) 10/13/2023   Medicare Annual Wellness (AWV)  01/26/2024   Colonoscopy  04/19/2024   DTaP/Tdap/Td (3 - Td or Tdap) 08/19/2032   Pneumonia Vaccine 100+ Years old  Completed   INFLUENZA VACCINE  Completed   Hepatitis C Screening  Completed   Zoster Vaccines- Shingrix  Completed   HPV VACCINES  Aged Out  Colonoscopy 04/19/2017, repeat 5 years initially planned, adjusted to 7 years.  Prostate: does not have family history of prostate cancer The natural history of prostate cancer and ongoing controversy regarding screening and potential treatment outcomes of prostate cancer has been discussed with the patient. The meaning of a false positive PSA and a false negative PSA has been discussed. He indicates understanding of the limitations of this screening test and wishes  to proceed with screening PSA testing. Lab Results  Component Value Date   PSA1 0.9 09/09/2020   PSA1 0.8 07/05/2019   PSA1 0.8 03/08/2018   PSA 0.47 10/17/2022   PSA 0.81 10/08/2021   PSA 0.6 11/03/2016    Immunization  History  Administered Date(s) Administered   Fluad Quad(high Dose 65+) 08/19/2022   Hep A / Hep B 07/28/2023   Influenza Split 09/22/2012   Influenza, Quadrivalent, Recombinant, Inj, Pf 08/26/2018   Influenza,inj,Quad PF,6+ Mos 08/06/2014, 10/16/2015, 10/08/2016, 08/26/2017, 08/17/2019   Influenza-Unspecified 08/04/2023   JAPANESE ENCEPHALITIS IM 07/28/2023   Moderna Sars-Covid-2 Vaccination 03/09/2020, 04/06/2020, 12/04/2020   PNEUMOCOCCAL CONJUGATE-20 04/11/2022   Pfizer Covid-19 Vaccine Bivalent Booster 42yrs & up 10/08/2021   Pfizer(Comirnaty)Fall Seasonal Vaccine 12 years and older 08/18/2023   Pneumococcal Polysaccharide-23 09/09/2020   Tdap 12/19/2010, 08/19/2022   Typhoid Live 06/23/2023   Zoster Recombinant(Shingrix) 07/07/2019, 07/31/2019  UTD on vaccines.  Did not need abx with travel, no illness.   No results found. Optho  - visit recently. Hx of  cataract surgery OU.   Dental: every 6 months.   Alcohol:few glasses of wine per day.    Tobacco: none  Exercise/obesity: Somewhat limited by knee pain., walking dog. Water based exercise discussed.  Glucose 134 in 04/2022.  Lab Results  Component Value Date   HGBA1C 5.5 10/08/2021   Body mass index is 34.1 kg/m. Wt Readings from Last 3 Encounters:  10/18/23 246 lb 3.2 oz (111.7 kg)  08/07/23 245 lb 3.2 oz (111.2 kg)  05/18/23 243 lb 3.2 oz (110.3 kg)    History Patient Active Problem List   Diagnosis Date Noted   Floaters, right 07/28/2023   Floaters, left 12/30/2022   Cataracts, both eyes 12/22/2022   Calf swelling 02/04/2022   Nerve pain 02/04/2022   HTN (hypertension) 01/18/2012   Insomnia 01/18/2012   Tinnitus 01/18/2012   GERD 05/28/2008   History of colonic polyps 05/28/2008   Past Medical History:  Diagnosis Date   Adenomatous polyp of colon    GERD (gastroesophageal reflux disease)    Hypertension    Tinnitus of both ears    Past Surgical History:  Procedure Laterality Date   CATARACT EXTRACTION  11/2022   COLONOSCOPY     KNEE ARTHROSCOPY Left 11/2020   TONSILLECTOMY AND ADENOIDECTOMY  12/19/1960   Allergies  Allergen Reactions   Levofloxacin Hives and Rash    Skin got like leather   Penicillins Rash   Prior to Admission medications   Medication Sig Start Date End Date Taking? Authorizing Provider  lisinopril (ZESTRIL) 10 MG tablet Take 1 tablet (10 mg total) by mouth daily. 04/13/23  Yes Shade Flood, MD  meloxicam (MOBIC) 7.5 MG tablet Take 1 tablet (7.5 mg total) by mouth daily as needed for pain (knee pain). 08/07/23  Yes Shade Flood, MD  multivitamin-lutein [facility]) CAPS capsule Take 2 capsules by mouth daily.   Yes [provider]  rosuvastatin (CRESTOR) 5 MG tablet Take 1 tablet (5 mg total) by mouth daily. 04/13/23  Yes Shade Flood, MD  zolpidem (AMBIEN) 5 MG tablet Take 1 tablet (5 mg total) by mouth at  bedtime as needed for sleep. Patient not taking: Reported on 08/07/2023 04/13/23   Shade Flood, MD   Social History   Socioeconomic History   Marital status: Married    Spouse name: Not on file   Number of children: 3   Years of education: Not on file   Highest education level: Bachelor's degree (e.g., BA, AB, BS)  Occupational History   Occupation: Investment banker, corporate: [employer]  Tobacco Use   Smoking status: Never   Smokeless tobacco: Never  Vaping Use   Vaping status: Never Used  Substance and  Sexual Activity   Alcohol use: Yes    Alcohol/week: 8.0 standard drinks of alcohol    Types: 8 Glasses of wine per week   Drug use: No   Sexual activity: Yes    Birth control/protection: None  Other Topics Concern   Not on file  Social History Narrative   Not on file   Social Determinants of Health   Financial Resource Strain: Low Risk  (04/12/2023)   Overall Financial Resource Strain (CARDIA)    Difficulty of Paying Living Expenses: Not hard at all  Food Insecurity: No Food Insecurity (04/12/2023)   Hunger Vital Sign    Worried About Running Out of Food in the Last Year: Never true    Ran Out of Food in the Last Year: Never true  Transportation Needs: No Transportation Needs (04/12/2023)   PRAPARE - Administrator, Civil Service (Medical): No    Lack of Transportation (Non-Medical): No  Physical Activity: Sufficiently Active (04/12/2023)   Exercise Vital Sign    Days of Exercise per Week: 6 days    Minutes of Exercise per Session: 50 min  Stress: No Stress Concern Present (04/12/2023)   [employer] of Occupational Health - Occupational Stress Questionnaire    Feeling of Stress : Only a little  Social Connections: Socially Integrated (04/12/2023)   Social Connection and Isolation Panel [NHANES]    Frequency of Communication with Friends and Family: More than three times a week    Frequency of Social Gatherings with Friends and Family: Once a week     Attends Religious Services: More than 4 times per year    Active Member of [employer] or Organizations: Yes    Attends Banker Meetings: More than 4 times per year    Marital Status: Married  Catering manager Violence: Not At Risk (01/25/2023)   Humiliation, Afraid, Rape, and Kick questionnaire    Fear of Current or Ex-Partner: No    Emotionally Abused: No    Physically Abused: No    Sexually Abused: No    Review of Systems 13 point review of systems per patient health survey noted.  Negative other than as indicated above or in HPI.    Objective:   Vitals:   10/18/23 0821  BP: 122/70  Pulse: 62  Temp: 97.6 F (36.4 C)  TempSrc: Temporal  SpO2: 99%  Weight: 246 lb 3.2 oz (111.7 kg)  Height: 5' 11.25" (1.81 m)     Physical Exam Vitals reviewed.  Constitutional:      Appearance: He is well-developed.  HENT:     Head: Normocephalic and atraumatic.     Right Ear: External ear normal.     Left Ear: External ear normal.  Eyes:     Conjunctiva/sclera: Conjunctivae normal.     Pupils: Pupils are equal, round, and reactive to light.  Neck:     Thyroid: No thyromegaly.  Cardiovascular:     Rate and Rhythm: Normal rate and regular rhythm.     Heart sounds: Normal heart sounds.  Pulmonary:     Effort: Pulmonary effort is normal. No respiratory distress.     Breath sounds: Normal breath sounds. No wheezing.  Abdominal:     General: There is no distension.     Palpations: Abdomen is soft.     Tenderness: There is no abdominal tenderness.  Musculoskeletal:        General: No tenderness. Normal range of motion.     Cervical back: Normal range of  motion and neck supple.  Lymphadenopathy:     Cervical: No cervical adenopathy.  Skin:    General: Skin is warm and dry.  Neurological:     Mental Status: He is alert and oriented to person, place, and time.     Deep Tendon Reflexes: Reflexes are normal and symmetric.  Psychiatric:        Behavior: Behavior normal.         Assessment & Plan:  Warren Hill is a 67 y.o. male . Annual physical exam  - -anticipatory guidance as below in AVS, screening labs above. Health maintenance items as above in HPI discussed/recommended as applicable.   Essential hypertension - Plan: CBC with Differential/Platelet  -  Stable, tolerating current regimen.  Labs pending as above.   Hyperlipidemia, unspecified hyperlipidemia type - Plan: Comprehensive metabolic panel, Lipid panel  -Tolerating Crestor, check labs, adjust plan accordingly.  Screening for prostate cancer - Plan: PSA, Medicare ( [facility] [place] only)  Screening for diabetes mellitus - Plan: Comprehensive metabolic panel, Hemoglobin A1c Hyperglycemia - Plan: Comprehensive metabolic panel, Hemoglobin A1c  -Mild elevated glucose previously, has looked okay on the recent levels.  Check A1c.  Continue exercise with water-based exercise discussed for weight management.  Ortho follow-up if knee limiting exercise.  No orders of the defined types were placed in this encounter.  Patient Instructions  Thanks for coming in today.  Tell the family hello.  No med changes at this time.  If any concerns on your labs I will let you know.  Take care  Preventive Care 78 Years and Older, Male Preventive care refers to lifestyle choices and visits with your health care provider that can promote health and wellness. Preventive care visits are also called wellness exams. What can I expect for my preventive care visit? Counseling During your preventive care visit, your health care provider may ask about your: Medical history, including: Past medical problems. Family medical history. History of falls. Current health, including: Emotional well-being. Home life and relationship well-being. Sexual activity. Memory and ability to understand (cognition). Lifestyle, including: Alcohol, nicotine or tobacco, and drug use. Access to firearms. Diet, exercise, and sleep  habits. Work and work Astronomer. Sunscreen use. Safety issues such as seatbelt and bike helmet use. Physical exam Your health care provider will check your: Height and weight. These may be used to calculate your BMI (body mass index). BMI is a measurement that tells if you are at a healthy weight. Waist circumference. This measures the distance around your waistline. This measurement also tells if you are at a healthy weight and may help predict your risk of certain diseases, such as type 2 diabetes and high blood pressure. Heart rate and blood pressure. Body temperature. Skin for abnormal spots. What immunizations do I need?  Vaccines are usually given at various ages, according to a schedule. Your health care provider will recommend vaccines for you based on your age, medical history, and lifestyle or other factors, such as travel or where you work. What tests do I need? Screening Your health care provider may recommend screening tests for certain conditions. This may include: Lipid and cholesterol levels. Diabetes screening. This is done by checking your blood sugar (glucose) after you have not eaten for a while (fasting). Hepatitis C test. Hepatitis B test. HIV (human immunodeficiency virus) test. STI (sexually transmitted infection) testing, if you are at risk. Lung cancer screening. Colorectal cancer screening. Prostate cancer screening. Abdominal aortic aneurysm (AAA) screening. You may need this if  you are a current or former smoker. Talk with your health care provider about your test results, treatment options, and if necessary, the need for more tests. Follow these instructions at home: Eating and drinking  Eat a diet that includes fresh fruits and vegetables, whole grains, lean protein, and low-fat dairy products. Limit your intake of foods with high amounts of sugar, saturated fats, and salt. Take vitamin and mineral supplements as recommended by your health care  provider. Do not drink alcohol if your health care provider tells you not to drink. If you drink alcohol: Limit how much you have to 0-2 drinks a day. Know how much alcohol is in your drink. In the [place] one drink equals one 12 oz bottle of beer (355 mL), one 5 oz glass of wine (148 mL), or one 1 oz glass of hard liquor (44 mL). Lifestyle Brush your teeth every morning and night with fluoride toothpaste. Floss one time each day. Exercise for at least 30 minutes 5 or more days each week. Do not use any products that contain nicotine or tobacco. These products include cigarettes, chewing tobacco, and vaping devices, such as e-cigarettes. If you need help quitting, ask your health care provider. Do not use drugs. If you are sexually active, practice safe sex. Use a condom or other form of protection to prevent STIs. Take aspirin only as told by your health care provider. Make sure that you understand how much to take and what form to take. Work with your health care provider to find out whether it is safe and beneficial for you to take aspirin daily. Ask your health care provider if you need to take a cholesterol-lowering medicine (statin). Find healthy ways to manage stress, such as: Meditation, yoga, or listening to music. Journaling. Talking to a trusted person. Spending time with friends and family. Safety Always wear your seat belt while driving or riding in a vehicle. Do not drive: If you have been drinking alcohol. Do not ride with someone who has been drinking. When you are tired or distracted. While texting. If you have been using any mind-altering substances or drugs. Wear a helmet and other protective equipment during sports activities. If you have firearms in your house, make sure you follow all gun safety procedures. Minimize exposure to UV radiation to reduce your risk of skin cancer. What's next? Visit your health care provider once a year for an annual wellness visit. Ask  your health care provider how often you should have your eyes and teeth checked. Stay up to date on all vaccines. This information is not intended to replace advice given to you by your health care provider. Make sure you discuss any questions you have with your health care provider. Document Revised: 06/02/2021 Document Reviewed: 06/02/2021 Elsevier Patient Education  2024 Elsevier Inc.     Signed,   Meredith Staggers, MD [facility], [facility] 10/18/23 9:16 AM

## 2023-11-28 ENCOUNTER — Ambulatory Visit: Payer: Medicare Other | Attending: Internal Medicine | Admitting: Internal Medicine

## 2023-11-28 ENCOUNTER — Encounter: Payer: Self-pay | Admitting: Internal Medicine

## 2023-11-28 VITALS — BP 130/72 | HR 63 | Ht 71.0 in | Wt 251.4 lb

## 2023-11-28 DIAGNOSIS — R931 Abnormal findings on diagnostic imaging of heart and coronary circulation: Secondary | ICD-10-CM

## 2023-11-28 DIAGNOSIS — M79605 Pain in left leg: Secondary | ICD-10-CM

## 2023-11-28 DIAGNOSIS — I10 Essential (primary) hypertension: Secondary | ICD-10-CM

## 2023-11-28 DIAGNOSIS — E782 Mixed hyperlipidemia: Secondary | ICD-10-CM

## 2023-11-28 DIAGNOSIS — I2510 Atherosclerotic heart disease of native coronary artery without angina pectoris: Secondary | ICD-10-CM

## 2023-11-28 DIAGNOSIS — M79604 Pain in right leg: Secondary | ICD-10-CM

## 2023-11-28 NOTE — Patient Instructions (Signed)
Medication Instructions:  Your physician recommends that you continue on your current medications as directed. Please refer to the Current Medication list given to you today.  *If you need a refill on your cardiac medications before your next appointment, please call your pharmacy*  Lab Work: None  Follow-Up: At [facility], you and your health needs are our priority.  As part of our continuing mission to provide you with exceptional heart care, we have created designated Provider Care Teams.  These Care Teams include your primary Cardiologist (physician) and Advanced Practice Providers (APPs -  Physician Assistants and Nurse Practitioners) who all work together to provide you with the care you need, when you need it.  Your next appointment:   6 month(s)  Provider:   Parke Poisson, MD

## 2023-11-28 NOTE — Progress Notes (Signed)
Cardiology Office Note:  .   Date:  11/28/2023  ID:  Warren Hill, DOB 1956/06/08, MRN [id number] PCP: Shade Flood, MD  [facility] Providers Cardiologist:  Parke Poisson, MD    History of Present Illness: .   Warren Hill is a 67 y.o. male.  Discussed the use of AI scribe software for clinical note transcription with the patient, who gave verbal consent to proceed.  History of Present Illness   The patient, with a history of hypertension, hyperlipidemia, and knee arthritis, presents for a routine check-up. He reports a recent weight gain, which he attributes to increased eating during the winter months. Despite this, he has been making efforts to eat healthier, incorporating good oils, whole grains, and lean proteins into his diet.  The patient's knee arthritis has been causing discomfort, limiting his ability to walk as much as he used to. He is currently on medication for the pain, which he describes as more related to the tendons around the knee rather than bone-on-bone contact.  He also mentions a history of leg pain, which he believes is unrelated to his current medications and has been present since before he started any treatments. The patient does not express any other concerns or symptoms at this time.        ROS: negative except per HPI above.  Studies Reviewed: Marland Kitchen   EKG Interpretation Date/Time:  Tuesday November 28 2023 08:39:48 EST Ventricular Rate:  63 PR Interval:  146 QRS Duration:  86 QT Interval:  380 QTC Calculation: 388 R Axis:   56  Text Interpretation: Normal sinus rhythm Normal ECG Confirmed by Weston Brass ([id number]) on 11/28/2023 8:59:11 AM    Results   LABS PLT: 137,000 (09/2023) WBC: Normal (09/2023) RBC: Normal (09/2023) A1c: 5.4% (09/2023)  DIAGNOSTIC EKG: Normal     Risk Assessment/Calculations:             Physical Exam:   VS:  BP 130/72 (BP Location: Left Arm, Patient Position: Sitting, Cuff Size: Normal)   Pulse  63   Ht 5\' 11"  (1.803 m)   Wt 251 lb 6.4 oz (114 kg)   SpO2 98%   BMI 35.06 kg/m    Wt Readings from Last 3 Encounters:  11/28/23 251 lb 6.4 oz (114 kg)  10/18/23 246 lb 3.2 oz (111.7 kg)  08/07/23 245 lb 3.2 oz (111.2 kg)     Physical Exam   VITALS: BP- 130/72 CHEST: lungs clear to auscultation CARDIOVASCULAR: heart sounds normal     GEN: Well nourished, well developed in no acute distress NECK: No JVD; No carotid bruits CARDIAC: RRR, no murmurs, rubs, gallops RESPIRATORY:  Clear to auscultation without rales, wheezing or rhonchi  ABDOMEN: Soft, non-tender, non-distended EXTREMITIES:  No edema; No deformity   ASSESSMENT AND PLAN: .    1. Coronary artery disease involving native coronary artery of native heart without angina pectoris     Assessment and Plan    Hyperlipidemia Cholesterol levels are excellent, likely due to dietary changes and medication (Crestor 5mg ). -Continue Crestor 5mg  daily.  Hypertension Blood pressure slightly elevated today (130/72) compared to previous visit (112/82). Patient is on Lisinopril. -Continue Lisinopril. -Encourage weight loss and regular exercise to help manage blood pressure.  Thrombocytopenia Platelet count slightly low, but has been borderline for the past 10 years. No current medications that would affect platelet count. -Monitor platelet count, no immediate intervention needed.  Follow-up in 6 months.     Will obtain echo in  2025 to follow aorta borderline measurement. No urgent indication.             Weston Brass, MD, [facility]   [facility]

## 2024-01-04 DIAGNOSIS — L821 Other seborrheic keratosis: Secondary | ICD-10-CM | POA: Diagnosis not present

## 2024-01-04 DIAGNOSIS — L814 Other melanin hyperpigmentation: Secondary | ICD-10-CM | POA: Diagnosis not present

## 2024-01-04 DIAGNOSIS — L728 Other follicular cysts of the skin and subcutaneous tissue: Secondary | ICD-10-CM | POA: Diagnosis not present

## 2024-01-04 DIAGNOSIS — D225 Melanocytic nevi of trunk: Secondary | ICD-10-CM | POA: Diagnosis not present

## 2024-01-31 DIAGNOSIS — K080 Exfoliation of teeth due to systemic causes: Secondary | ICD-10-CM | POA: Diagnosis not present

## 2024-02-20 DIAGNOSIS — K080 Exfoliation of teeth due to systemic causes: Secondary | ICD-10-CM | POA: Diagnosis not present

## 2024-03-26 ENCOUNTER — Ambulatory Visit (INDEPENDENT_AMBULATORY_CARE_PROVIDER_SITE_OTHER): Admitting: *Deleted

## 2024-03-26 DIAGNOSIS — Z0000 Encounter for general adult medical examination without abnormal findings: Secondary | ICD-10-CM | POA: Diagnosis not present

## 2024-03-26 NOTE — Patient Instructions (Signed)
 [name] , Thank you for taking time to come for your Medicare Wellness Visit. I appreciate your ongoing commitment to your health goals. Please review the following plan we discussed and let me know if I can assist you in the future.   Screening recommendations/referrals: Colonoscopy: scheduled Recommended yearly ophthalmology/optometry visit for glaucoma screening and checkup Recommended yearly dental visit for hygiene and checkup  Vaccinations: Influenza vaccine: up to date Pneumococcal vaccine: up to date Tdap vaccine: up to date Shingles vaccine: up to date     Preventive Care 65 Years and Older, Male Preventive care refers to lifestyle choices and visits with your health care provider that can promote health and wellness. What does preventive care include? A yearly physical exam. This is also called an annual well check. Dental exams once or twice a year. Routine eye exams. Ask your health care provider how often you should have your eyes checked. Personal lifestyle choices, including: Daily care of your teeth and gums. Regular physical activity. Eating a healthy diet. Avoiding tobacco and drug use. Limiting alcohol use. Practicing safe sex. Taking low doses of aspirin every day. Taking vitamin and mineral supplements as recommended by your health care provider. What happens during an annual well check? The services and screenings done by your health care provider during your annual well check will depend on your age, overall health, lifestyle risk factors, and family history of disease. Counseling  Your health care provider may ask you questions about your: Alcohol use. Tobacco use. Drug use. Emotional well-being. Home and relationship well-being. Sexual activity. Eating habits. History of falls. Memory and ability to understand (cognition). Work and work Astronomer. Screening  You may have the following tests or measurements: Height, weight, and BMI. Blood  pressure. Lipid and cholesterol levels. These may be checked every 5 years, or more frequently if you are over 37 years old. Skin check. Lung cancer screening. You may have this screening every year starting at age 17 if you have a 30-pack-year history of smoking and currently smoke or have quit within the past 15 years. Fecal occult blood test (FOBT) of the stool. You may have this test every year starting at age 81. Flexible sigmoidoscopy or colonoscopy. You may have a sigmoidoscopy every 5 years or a colonoscopy every 10 years starting at age 63. Prostate cancer screening. Recommendations will vary depending on your family history and other risks. Hepatitis C blood test. Hepatitis B blood test. Sexually transmitted disease (STD) testing. Diabetes screening. This is done by checking your blood sugar (glucose) after you have not eaten for a while (fasting). You may have this done every 1-3 years. Abdominal aortic aneurysm (AAA) screening. You may need this if you are a current or former smoker. Osteoporosis. You may be screened starting at age 62 if you are at high risk. Talk with your health care provider about your test results, treatment options, and if necessary, the need for more tests. Vaccines  Your health care provider may recommend certain vaccines, such as: Influenza vaccine. This is recommended every year. Tetanus, diphtheria, and acellular pertussis (Tdap, Td) vaccine. You may need a Td booster every 10 years. Zoster vaccine. You may need this after age 69. Pneumococcal 13-valent conjugate (PCV13) vaccine. One dose is recommended after age 64. Pneumococcal polysaccharide (PPSV23) vaccine. One dose is recommended after age 59. Talk to your health care provider about which screenings and vaccines you need and how often you need them. This information is not intended to replace advice  given to you by your health care provider. Make sure you discuss any questions you have with your  health care provider. Document Released: 01/01/2016 Document Revised: 08/24/2016 Document Reviewed: 10/06/2015 Elsevier Interactive Patient Education  2017 [employer].  Fall Prevention in the Home Falls can cause injuries. They can happen to people of all ages. There are many things you can do to make your home safe and to help prevent falls. What can I do on the outside of my home? Regularly fix the edges of walkways and driveways and fix any cracks. Remove anything that might make you trip as you walk through a door, such as a raised step or threshold. Trim any bushes or trees on the path to your home. Use bright outdoor lighting. Clear any walking paths of anything that might make someone trip, such as rocks or tools. Regularly check to see if handrails are loose or broken. Make sure that both sides of any steps have handrails. Any raised decks and porches should have guardrails on the edges. Have any leaves, snow, or ice cleared regularly. Use sand or salt on walking paths during winter. Clean up any spills in your garage right away. This includes oil or grease spills. What can I do in the bathroom? Use night lights. Install grab bars by the toilet and in the tub and shower. Do not use towel bars as grab bars. Use non-skid mats or decals in the tub or shower. If you need to sit down in the shower, use a plastic, non-slip stool. Keep the floor dry. Clean up any water that spills on the floor as soon as it happens. Remove soap buildup in the tub or shower regularly. Attach bath mats securely with double-sided non-slip rug tape. Do not have throw rugs and other things on the floor that can make you trip. What can I do in the bedroom? Use night lights. Make sure that you have a light by your bed that is easy to reach. Do not use any sheets or blankets that are too big for your bed. They should not hang down onto the floor. Have a firm chair that has side arms. You can use this for  support while you get dressed. Do not have throw rugs and other things on the floor that can make you trip. What can I do in the kitchen? Clean up any spills right away. Avoid walking on wet floors. Keep items that you use a lot in easy-to-reach places. If you need to reach something above you, use a strong step stool that has a grab bar. Keep electrical cords out of the way. Do not use floor polish or wax that makes floors slippery. If you must use wax, use non-skid floor wax. Do not have throw rugs and other things on the floor that can make you trip. What can I do with my stairs? Do not leave any items on the stairs. Make sure that there are handrails on both sides of the stairs and use them. Fix handrails that are broken or loose. Make sure that handrails are as long as the stairways. Check any carpeting to make sure that it is firmly attached to the stairs. Fix any carpet that is loose or worn. Avoid having throw rugs at the top or bottom of the stairs. If you do have throw rugs, attach them to the floor with carpet tape. Make sure that you have a light switch at the top of the stairs and the bottom of  the stairs. If you do not have them, ask someone to add them for you. What else can I do to help prevent falls? Wear shoes that: Do not have high heels. Have rubber bottoms. Are comfortable and fit you well. Are closed at the toe. Do not wear sandals. If you use a stepladder: Make sure that it is fully opened. Do not climb a closed stepladder. Make sure that both sides of the stepladder are locked into place. Ask someone to hold it for you, if possible. Clearly mark and make sure that you can see: Any grab bars or handrails. First and last steps. Where the edge of each step is. Use tools that help you move around (mobility aids) if they are needed. These include: Canes. Walkers. Scooters. Crutches. Turn on the lights when you go into a dark area. Replace any light bulbs as soon  as they burn out. Set up your furniture so you have a clear path. Avoid moving your furniture around. If any of your floors are uneven, fix them. If there are any pets around you, be aware of where they are. Review your medicines with your doctor. Some medicines can make you feel dizzy. This can increase your chance of falling. Ask your doctor what other things that you can do to help prevent falls. This information is not intended to replace advice given to you by your health care provider. Make sure you discuss any questions you have with your health care provider. Document Released: 10/01/2009 Document Revised: 05/12/2016 Document Reviewed: 01/09/2015 Elsevier Interactive Patient Education  2017 [employer].

## 2024-03-26 NOTE — Progress Notes (Signed)
 Subjective:   Warren Hill is a 68 y.o. male who presents for Medicare Annual/Subsequent preventive examination.  Visit Complete: Virtual I connected with  Warren Hill on 03/26/24 by a audio enabled telemedicine application and verified that I am speaking with the correct person using two identifiers.  Patient Location: Home  Provider Location: Home Office  I discussed the limitations of evaluation and management by telemedicine. The patient expressed understanding and agreed to proceed.  Vital Signs: Because this visit was a virtual/telehealth visit, some criteria may be missing or patient reported. Any vitals not documented were not able to be obtained and vitals that have been documented are patient reported.  Patient Medicare AWV questionnaire was completed by the patient on 03-25-2024; I have confirmed that all information answered by patient is correct and no changes since this date.  Cardiac Risk Factors include: advanced age (>31men, >63 women);hypertension     Objective:    Today's Vitals   03/26/24 0931  PainSc: 3    There is no height or weight on file to calculate BMI.     03/26/2024    9:33 AM 01/25/2023    9:10 AM 10/08/2021    8:11 AM 09/09/2020    1:22 PM 06/26/2020    2:02 PM 03/08/2018    3:19 PM 04/07/2017   10:42 AM  Advanced Directives  Does Patient Have a Medical Advance Directive? Yes Yes Yes Yes Yes Yes Yes  Type of Estate agent of [place];Living will Healthcare Power of [place];Living will   Healthcare Power of [place];Living will Living will Healthcare Power of [place];Living will  Does patient want to make changes to medical advance directive?   Yes (ED - send information to MyChart) Yes (ED - Information included in AVS) No - Patient declined No - Patient declined   Copy of Healthcare Power of Attorney in Chart? No - copy requested No - copy requested     No - copy requested    Current Medications (verified) Outpatient  Encounter Medications as of 03/26/2024  Medication Sig   lisinopril (ZESTRIL) 10 MG tablet Take 1 tablet (10 mg total) by mouth daily.   meloxicam (MOBIC) 7.5 MG tablet Take 1 tablet (7.5 mg total) by mouth daily as needed for pain (knee pain).   multivitamin-lutein (OCUVITE-LUTEIN) CAPS capsule Take 2 capsules by mouth daily.   rosuvastatin (CRESTOR) 5 MG tablet Take 1 tablet (5 mg total) by mouth daily.   zolpidem (AMBIEN) 5 MG tablet Take 1 tablet (5 mg total) by mouth at bedtime as needed for sleep. (Patient not taking: Reported on 03/26/2024)   No facility-administered encounter medications on file as of 03/26/2024.    Allergies (verified) Levofloxacin and Penicillins   History: Past Medical History:  Diagnosis Date   Adenomatous polyp of colon    GERD (gastroesophageal reflux disease)    Hypertension    Tinnitus of both ears    Past Surgical History:  Procedure Laterality Date   CATARACT EXTRACTION  11/2022   COLONOSCOPY     KNEE ARTHROSCOPY Left 11/2020   TONSILLECTOMY AND ADENOIDECTOMY  12/19/1960   Family History  Problem Relation Age of Onset   Colon polyps Mother    Hypertension Mother    Colon polyps Father    Hypertension Father    Colon polyps Brother    Pancreatic cancer Paternal Uncle    Liver disease Paternal Uncle    Colon cancer Maternal Grandmother    Diabetes Paternal Grandmother    Heart  attack Cousin    Social History   Socioeconomic History   Marital status: Married    Spouse name: Not on file   Number of children: 3   Years of education: Not on file   Highest education level: Bachelor's degree (e.g., BA, AB, BS)  Occupational History   Occupation: Investment banker, corporate: [employer]  Tobacco Use   Smoking status: Never   Smokeless tobacco: Never  Vaping Use   Vaping status: Never Used  Substance and Sexual Activity   Alcohol use: Yes    Alcohol/week: 8.0 standard drinks of alcohol    Types: 8 Glasses of wine per week   Drug use: No   Sexual  activity: Yes    Birth control/protection: None  Other Topics Concern   Not on file  Social History Narrative   Not on file   Social Drivers of Health   Financial Resource Strain: Low Risk  (03/26/2024)   Overall Financial Resource Strain (CARDIA)    Difficulty of Paying Living Expenses: Not hard at all  Food Insecurity: No Food Insecurity (03/26/2024)   Hunger Vital Sign    Worried About Running Out of Food in the Last Year: Never true    Ran Out of Food in the Last Year: Never true  Transportation Needs: No Transportation Needs (03/26/2024)   PRAPARE - Administrator, Civil Service (Medical): No    Lack of Transportation (Non-Medical): No  Physical Activity: Insufficiently Active (03/26/2024)   Exercise Vital Sign    Days of Exercise per Week: 2 days    Minutes of Exercise per Session: 20 min  Stress: No Stress Concern Present (03/26/2024)   [employer] of Occupational Health - Occupational Stress Questionnaire    Feeling of Stress : Only a little  Social Connections: Socially Integrated (03/26/2024)   Social Connection and Isolation Panel [NHANES]    Frequency of Communication with Friends and Family: More than three times a week    Frequency of Social Gatherings with Friends and Family: Once a week    Attends Religious Services: More than 4 times per year    Active Member of [employer] or Organizations: Yes    Attends Engineer, structural: More than 4 times per year    Marital Status: Married    Tobacco Counseling Counseling given: Not Answered   Clinical Intake:  Pre-visit preparation completed: Yes  Pain : 0-10 Pain Score: 3  Pain Type: Acute pain Pain Location: Knee Pain Descriptors / Indicators: Aching, Burning, Dull Pain Onset: In the past 7 days Pain Frequency: Intermittent     Diabetes: No  How often do you need to have someone help you when you read instructions, pamphlets, or other written materials from your doctor or pharmacy?: 1 -  Never  Interpreter Needed?: No  Information entered by :: Remi Haggard LPN   Activities of Daily Living    03/26/2024    9:34 AM 03/25/2024    8:09 AM  In your present state of health, do you have any difficulty performing the following activities:  Hearing? 0 0  Vision? 0 0  Difficulty concentrating or making decisions? 0 0  Walking or climbing stairs? 0 0  Dressing or bathing? 0 0  Doing errands, shopping? 0 0  Preparing Food and eating ? N N  Using the Toilet? N N  In the past six months, have you accidently leaked urine? N N  Do you have problems with loss of bowel  control? N N  Managing your Medications? N N  Managing your Finances? N N  Housekeeping or managing your Housekeeping? N N    Patient Care Team: Shade Flood, MD as PCP - General (Family Medicine) Parke Poisson, MD as PCP - Cardiology (Cardiology) Louann Sjogren, DPM as Consulting Physician (Podiatry) Maeola Harman, MD as Consulting Physician (Vascular Surgery) Sallye Lat, MD as Consulting Physician (Ophthalmology)  Indicate any recent Medical Services you may have received from other than [facility] providers in the past year (date may be approximate).     Assessment:   This is a routine wellness examination for [place].  Hearing/Vision screen Hearing Screening - Comments:: Bilateral hearing aids Vision Screening - Comments:: Cataract 1 year ago [facility] Up to date   Goals Addressed             This Visit's Progress    Patient Stated       Stay healthy       Depression Screen    03/26/2024    9:36 AM 10/18/2023    8:24 AM 08/07/2023    9:29 AM 04/13/2023    8:45 AM 01/25/2023    9:11 AM 10/12/2022    8:01 AM 09/01/2022    7:56 AM  PHQ 2/9 Scores  PHQ - 2 Score 0 0 1 0 0 0 0  PHQ- 9 Score 0 3 3 1  1 3     Fall Risk    03/26/2024    9:46 AM 03/25/2024    8:09 AM 08/07/2023    9:28 AM 04/13/2023    8:44 AM 01/25/2023    9:08 AM  Fall Risk   Falls in the past year? 0 0 0 0  0  Number falls in past yr: 0  0  0  Injury with Fall? 0  0  0  Risk for fall due to :   No Fall Risks No Fall Risks No Fall Risks  Follow up Falls evaluation completed;Education provided;Falls prevention discussed  Falls evaluation completed  Falls prevention discussed    MEDICARE RISK AT HOME: Medicare Risk at Home Any stairs in or around the home?: Yes If so, are there any without handrails?: Yes Home free of loose throw rugs in walkways, pet beds, electrical cords, etc?: No Adequate lighting in your home to reduce risk of falls?: Yes Use of a cane, walker or w/c?: No Grab bars in the bathroom?: No Shower chair or bench in shower?: No Elevated toilet seat or a handicapped toilet?: No  TIMED UP AND GO:  Was the test performed?  No    Cognitive Function:        03/26/2024    9:33 AM 01/25/2023    9:11 AM 10/08/2021    8:09 AM 09/09/2020    1:13 PM  6CIT Screen  What Year? 0 points 0 points 0 points 0 points  What month? 0 points 0 points 0 points 0 points  What time? 0 points 0 points 0 points 0 points  Count back from 20 2 points 0 points 0 points 2 points  Months in reverse 0 points 0 points 0 points 0 points  Repeat phrase 0 points 0 points 0 points 2 points  Total Score 2 points 0 points 0 points 4 points    Immunizations Immunization History  Administered Date(s) Administered   Fluad Quad(high Dose 65+) 08/19/2022   Hep A / Hep B 07/28/2023   Influenza Split 09/22/2012   Influenza, Quadrivalent, Recombinant, Inj,  Pf 08/26/2018   Influenza,inj,Quad PF,6+ Mos 08/06/2014, 10/16/2015, 10/08/2016, 08/26/2017, 08/17/2019   Influenza-Unspecified 08/04/2023   JAPANESE ENCEPHALITIS IM 07/28/2023   Moderna Sars-Covid-2 Vaccination 03/09/2020, 04/06/2020, 12/04/2020   PNEUMOCOCCAL CONJUGATE-20 04/11/2022   Pfizer Covid-19 Vaccine Bivalent Booster 76yrs & up 10/08/2021   Pfizer(Comirnaty)Fall Seasonal Vaccine 12 years and older 08/18/2023   Pneumococcal Polysaccharide-23  09/09/2020   Tdap 12/19/2010, 08/19/2022   Typhoid Live 06/23/2023   Zoster Recombinant(Shingrix) 07/07/2019, 07/31/2019    TDAP status: Up to date  Flu Vaccine status: Up to date  Pneumococcal vaccine status: Up to date  Covid-19 vaccine status: Information provided on how to obtain vaccines.   Qualifies for Shingles Vaccine? No   Zostavax completed Yes   Shingrix Completed?: Yes  Screening Tests Health Maintenance  Topic Date Due   COVID-19 Vaccine (6 - Moderna risk 2024-25 season) 02/16/2024   Colonoscopy  04/19/2024   INFLUENZA VACCINE  07/19/2024   Medicare Annual Wellness (AWV)  03/26/2025   DTaP/Tdap/Td (3 - Td or Tdap) 08/19/2032   Pneumonia Vaccine 54+ Years old  Completed   Hepatitis C Screening  Completed   Zoster Vaccines- Shingrix  Completed   HPV VACCINES  Aged Out    Health Maintenance  Health Maintenance Due  Topic Date Due   COVID-19 Vaccine (6 - Moderna risk 2024-25 season) 02/16/2024   Colonoscopy  04/19/2024    Colonoscopy is scheduled 04-2024   Lung Cancer Screening: (Low Dose CT Chest recommended if Age 62-80 years, 20 pack-year currently smoking OR have quit w/in 15years.) does not qualify.   Lung Cancer Screening Referral:   Additional Screening:  Hepatitis C Screening: does not qualify; Completed 2016  Vision Screening: Recommended annual ophthalmology exams for early detection of glaucoma and other disorders of the eye. Is the patient up to date with their annual eye exam?  Yes  Who is the provider or what is the name of the office in which the patient attends annual eye exams? [facility] If pt is not established with a provider, would they like to be referred to a provider to establish care? No .   Dental Screening: Recommended annual dental exams for proper oral hygiene    Community Resource Referral / Chronic Care Management: CRR required this visit?  No   CCM required this visit?  No     Plan:     I have personally reviewed  and noted the following in the patient's chart:   Medical and social history Use of alcohol, tobacco or illicit drugs  Current medications and supplements including opioid prescriptions. Patient is not currently taking opioid prescriptions. Functional ability and status Nutritional status Physical activity Advanced directives List of other physicians Hospitalizations, surgeries, and ER visits in previous 12 months Vitals Screenings to include cognitive, depression, and falls Referrals and appointments  In addition, I have reviewed and discussed with patient certain preventive protocols, quality metrics, and best practice recommendations. A written personalized care plan for preventive services as well as general preventive health recommendations were provided to patient.     Remi Haggard, LPN   05/22/3328   After Visit Summary: (MyChart) Due to this being a telephonic visit, the after visit summary with patients personalized plan was offered to patient via MyChart   Nurse Notes:

## 2024-03-27 ENCOUNTER — Encounter: Payer: Self-pay | Admitting: Family Medicine

## 2024-03-27 ENCOUNTER — Ambulatory Visit (INDEPENDENT_AMBULATORY_CARE_PROVIDER_SITE_OTHER): Admitting: Family Medicine

## 2024-03-27 VITALS — BP 118/74 | HR 76 | Temp 98.0°F | Ht 71.0 in | Wt 258.0 lb

## 2024-03-27 DIAGNOSIS — M25562 Pain in left knee: Secondary | ICD-10-CM | POA: Diagnosis not present

## 2024-03-27 NOTE — Patient Instructions (Signed)
 Thanks for coming today.  I suspect she may have some meniscus issues in the knee since previous x-ray did not show much in the way of arthritis.  With the persistent symptoms I do recommend following up with Dr. Dion Saucier.  I suspect they will recommend an injection initially or minus physical therapy plus or minus imaging.  They can discuss the next step with you, but again I think that would be a reasonable approach for now.  I will see you in a few weeks.  Please let me know if there are questions in the meantime.

## 2024-03-27 NOTE — Progress Notes (Signed)
 Subjective:  Patient ID: Warren Hill, male    DOB: 11/06/1956  Age: 68 y.o. MRN: [id number]  CC:  Chief Complaint  Patient presents with   Knee Pain    Pt notes knee pain with bending, notes he has some difficulty with it becoming tired and difficulty with mobility.  Pt is fasting today in case of labs     HPI Deval Mroczka presents for acute issue above.  Has separate appointment April 30 for chronic conditions.  Left Knee pain Pain for some time. Had surgery on same knee in past. Working outside, some twisting.  Sore after activity. Has to lift up d/t pain at times. Improves after taking mobic for a few days, but recurrent pain. Using otc knee sleeve.  L knee XR 07/2023 - FINDINGS: No evidence of fracture, dislocation, or joint effusion. No evidence of arthropathy or other focal bone abnormality. Soft tissues are unremarkable. Small superior patellar spur.IMPRESSION: No acute osseous abnormalities.  Did have evaluation with Dr. Dion Saucier at [facility] on 08/28/2023.  4 years status post left knee arthroscopy with Dr. Wyline Mood at that time, intermittent left knee pain.  Similar symptoms discussed at that time.  Meloxicam, continue brace but option of injection if not improving within 4 to 6 weeks at that time    Has appt with GI in May, colonoscopy in 2018. Had called  GI prior and was told not ready yet. Discussed at physical last year and reported plan for 7 year repeat instead of 5 year. Will be seeing Dr. Lavon Paganini in May.    History Patient Active Problem List   Diagnosis Date Noted   Floaters, right 07/28/2023   Floaters, left 12/30/2022   Cataracts, both eyes 12/22/2022   Calf swelling 02/04/2022   Nerve pain 02/04/2022   HTN (hypertension) 01/18/2012   Insomnia 01/18/2012   Tinnitus 01/18/2012   GERD 05/28/2008   History of colonic polyps 05/28/2008   Past Medical History:  Diagnosis Date   Adenomatous polyp of colon    GERD (gastroesophageal reflux disease)     Hypertension    Tinnitus of both ears    Past Surgical History:  Procedure Laterality Date   CATARACT EXTRACTION  11/2022   COLONOSCOPY     KNEE ARTHROSCOPY Left 11/2020   TONSILLECTOMY AND ADENOIDECTOMY  12/19/1960   Allergies  Allergen Reactions   Levofloxacin Hives and Rash    Skin got like leather   Penicillins Rash   Prior to Admission medications   Medication Sig Start Date End Date Taking? Authorizing Provider  lisinopril (ZESTRIL) 10 MG tablet Take 1 tablet (10 mg total) by mouth daily. 04/13/23  Yes Shade Flood, MD  meloxicam (MOBIC) 7.5 MG tablet Take 1 tablet (7.5 mg total) by mouth daily as needed for pain (knee pain). 08/07/23  Yes Shade Flood, MD  multivitamin-lutein [facility]) CAPS capsule Take 2 capsules by mouth daily.   Yes [provider]  rosuvastatin (CRESTOR) 5 MG tablet Take 1 tablet (5 mg total) by mouth daily. 04/13/23  Yes Shade Flood, MD  zolpidem (AMBIEN) 5 MG tablet Take 1 tablet (5 mg total) by mouth at bedtime as needed for sleep. Patient not taking: Reported on 03/27/2024 04/13/23   Shade Flood, MD   Social History   Socioeconomic History   Marital status: Married    Spouse name: Not on file   Number of children: 3   Years of education: Not on file   Highest education  level: Bachelor's degree (e.g., BA, AB, BS)  Occupational History   Occupation: Investment banker, corporate: [employer]  Tobacco Use   Smoking status: Never   Smokeless tobacco: Never  Vaping Use   Vaping status: Never Used  Substance and Sexual Activity   Alcohol use: Yes    Alcohol/week: 8.0 standard drinks of alcohol    Types: 8 Glasses of wine per week   Drug use: No   Sexual activity: Yes    Birth control/protection: None  Other Topics Concern   Not on file  Social History Narrative   Not on file   Social Drivers of Health   Financial Resource Strain: Low Risk  (03/26/2024)   Overall Financial Resource Strain (CARDIA)    Difficulty of  Paying Living Expenses: Not hard at all  Food Insecurity: No Food Insecurity (03/26/2024)   Hunger Vital Sign    Worried About Running Out of Food in the Last Year: Never true    Ran Out of Food in the Last Year: Never true  Transportation Needs: No Transportation Needs (03/26/2024)   PRAPARE - Administrator, Civil Service (Medical): No    Lack of Transportation (Non-Medical): No  Physical Activity: Insufficiently Active (03/26/2024)   Exercise Vital Sign    Days of Exercise per Week: 2 days    Minutes of Exercise per Session: 20 min  Stress: No Stress Concern Present (03/26/2024)   [employer] of Occupational Health - Occupational Stress Questionnaire    Feeling of Stress : Only a little  Social Connections: Socially Integrated (03/26/2024)   Social Connection and Isolation Panel [NHANES]    Frequency of Communication with Friends and Family: More than three times a week    Frequency of Social Gatherings with Friends and Family: Once a week    Attends Religious Services: More than 4 times per year    Active Member of [employer] or Organizations: Yes    Attends Engineer, structural: More than 4 times per year    Marital Status: Married  Catering manager Violence: Not At Risk (03/26/2024)   Humiliation, Afraid, Rape, and Kick questionnaire    Fear of Current or Ex-Partner: No    Emotionally Abused: No    Physically Abused: No    Sexually Abused: No    Review of Systems   Objective:   Vitals:   03/27/24 1146  BP: 118/74  Pulse: 76  Temp: 98 F (36.7 C)  TempSrc: Temporal  SpO2: 97%  Weight: 258 lb (117 kg)  Height: 5\' 11"  (1.803 m)   Physical Exam Vitals reviewed.  Constitutional:      General: He is not in acute distress.    Appearance: Normal appearance. He is well-developed.  HENT:     Head: Normocephalic and atraumatic.  Cardiovascular:     Rate and Rhythm: Normal rate.  Pulmonary:     Effort: Pulmonary effort is normal.  Musculoskeletal:      Comments: Left knee, skin intact, no erythema or ecchymosis.  Possible trace effusion.  Discomfort at terminal flexion, able to extend fully without extensor lag.  Ambulating without assistive device.  Minimal discomfort at medial and lateral joint lines, patella nontender.  Slight medial and lateral discomfort with McMurray testing, negative varus, valgus and drawer.  Neurovascular intact distally.  Neurological:     Mental Status: He is alert and oriented to person, place, and time.  Psychiatric:        Mood and Affect: Mood  normal.        Assessment & Plan:  Jakhari Space is a 68 y.o. male . Left knee pain, unspecified chronicity Intermittent symptoms as above.  Suspicious for degenerative meniscal changes, previous imaging without significant arthritic disease.  Has been discussed with Ortho as above after note review and plan for 4 to 6-week follow-up at that time if persistent symptoms for probable injection.  He will follow-up with orthopedics, and discuss plan moving forward.  I suspect they will perform an injection and then can discuss timing of imaging if needed versus physical therapy.  Follow-up with GI planned next month.  Chronic condition follow-up with me in a few weeks.  RTC precautions given.  No orders of the defined types were placed in this encounter.  Patient Instructions  Thanks for coming today.  I suspect she may have some meniscus issues in the knee since previous x-ray did not show much in the way of arthritis.  With the persistent symptoms I do recommend following up with Dr. Dion Saucier.  I suspect they will recommend an injection initially or minus physical therapy plus or minus imaging.  They can discuss the next step with you, but again I think that would be a reasonable approach for now.  I will see you in a few weeks.  Please let me know if there are questions in the meantime.    Signed,   Meredith Staggers, MD [facility], [facility] 03/27/24 12:24 PM

## 2024-04-08 ENCOUNTER — Ambulatory Visit (AMBULATORY_SURGERY_CENTER)

## 2024-04-08 VITALS — Ht 71.0 in | Wt 255.0 lb

## 2024-04-08 DIAGNOSIS — Z860100 Personal history of colon polyps, unspecified: Secondary | ICD-10-CM

## 2024-04-08 MED ORDER — NA SULFATE-K SULFATE-MG SULF 17.5-3.13-1.6 GM/177ML PO SOLN: 1.0000 | Freq: Once | ORAL | 0 refills | Status: AC

## 2024-04-08 NOTE — Progress Notes (Signed)

## 2024-04-09 ENCOUNTER — Other Ambulatory Visit: Payer: Self-pay | Admitting: Family Medicine

## 2024-04-09 DIAGNOSIS — I10 Essential (primary) hypertension: Secondary | ICD-10-CM

## 2024-04-15 ENCOUNTER — Encounter: Payer: Self-pay | Admitting: Gastroenterology

## 2024-04-17 ENCOUNTER — Ambulatory Visit: Payer: Medicare Other | Admitting: Family Medicine

## 2024-04-23 ENCOUNTER — Ambulatory Visit: Admitting: Gastroenterology

## 2024-04-23 ENCOUNTER — Encounter: Payer: Self-pay | Admitting: Gastroenterology

## 2024-04-23 VITALS — BP 116/78 | HR 61 | Temp 98.0°F | Resp 16 | Ht 71.0 in | Wt 255.0 lb

## 2024-04-23 DIAGNOSIS — K5730 Diverticulosis of large intestine without perforation or abscess without bleeding: Secondary | ICD-10-CM | POA: Diagnosis not present

## 2024-04-23 DIAGNOSIS — K648 Other hemorrhoids: Secondary | ICD-10-CM | POA: Diagnosis not present

## 2024-04-23 DIAGNOSIS — Z1211 Encounter for screening for malignant neoplasm of colon: Secondary | ICD-10-CM | POA: Diagnosis not present

## 2024-04-23 DIAGNOSIS — K635 Polyp of colon: Secondary | ICD-10-CM

## 2024-04-23 DIAGNOSIS — D121 Benign neoplasm of appendix: Secondary | ICD-10-CM | POA: Diagnosis not present

## 2024-04-23 DIAGNOSIS — Z860101 Personal history of adenomatous and serrated colon polyps: Secondary | ICD-10-CM

## 2024-04-23 DIAGNOSIS — Z860100 Personal history of colon polyps, unspecified: Secondary | ICD-10-CM

## 2024-04-23 DIAGNOSIS — D125 Benign neoplasm of sigmoid colon: Secondary | ICD-10-CM | POA: Diagnosis not present

## 2024-04-23 DIAGNOSIS — K644 Residual hemorrhoidal skin tags: Secondary | ICD-10-CM | POA: Diagnosis not present

## 2024-04-23 MED ORDER — SODIUM CHLORIDE 0.9 % IV SOLN: 500.0000 mL | Freq: Once | INTRAVENOUS | Status: DC

## 2024-04-23 NOTE — Progress Notes (Unsigned)
 Report to PACU, RN, vss, BBS= Clear.

## 2024-04-23 NOTE — Progress Notes (Signed)
 Pt's states no medical or surgical changes since previsit or office visit.

## 2024-04-23 NOTE — Progress Notes (Unsigned)
 [facility] History and Physical   Primary Care Physician:  Benjiman Bras, MD   Reason for Procedure:  History of adenomatous colon polyps  Plan:    Surveillance colonoscopy with possible interventions as needed     HPI: Warren Hill is a very pleasant 68 y.o. male here for surveillance colonoscopy. Denies any nausea, vomiting, abdominal pain, melena or bright red blood per rectum  The risks and benefits as well as alternatives of endoscopic procedure(s) have been discussed and reviewed. All questions answered. The patient agrees to proceed.    Past Medical History:  Diagnosis Date   Adenomatous polyp of colon    GERD (gastroesophageal reflux disease)    Hypertension    Tinnitus of both ears     Past Surgical History:  Procedure Laterality Date   CATARACT EXTRACTION  11/2022   COLONOSCOPY     KNEE ARTHROSCOPY Left 11/2020   TONSILLECTOMY AND ADENOIDECTOMY  12/19/1960    Prior to Admission medications   Medication Sig Start Date End Date Taking? Authorizing Provider  lisinopril  (ZESTRIL ) 10 MG tablet TAKE 1 TABLET BY MOUTH EVERY DAY 04/09/24  Yes Benjiman Bras, MD  meloxicam  (MOBIC ) 15 MG tablet Take 15 mg by mouth daily as needed for pain. 03/11/24  Yes [provider]  rosuvastatin  (CRESTOR ) 5 MG tablet TAKE 1 TABLET (5 MG TOTAL) BY MOUTH DAILY. 04/09/24  Yes Benjiman Bras, MD  multivitamin-lutein [facility]) CAPS capsule Take 2 capsules by mouth daily.    [provider]  zolpidem  (AMBIEN ) 5 MG tablet Take 1 tablet (5 mg total) by mouth at bedtime as needed for sleep. Patient not taking: Reported on 03/26/2024 04/13/23   Benjiman Bras, MD    Current Outpatient Medications  Medication Sig Dispense Refill   lisinopril  (ZESTRIL ) 10 MG tablet TAKE 1 TABLET BY MOUTH EVERY DAY 90 tablet 3   meloxicam  (MOBIC ) 15 MG tablet Take 15 mg by mouth daily as needed for pain.     rosuvastatin  (CRESTOR ) 5 MG tablet TAKE 1 TABLET (5 MG  TOTAL) BY MOUTH DAILY. 90 tablet 3   multivitamin-lutein (OCUVITE-LUTEIN) CAPS capsule Take 2 capsules by mouth daily.     zolpidem  (AMBIEN ) 5 MG tablet Take 1 tablet (5 mg total) by mouth at bedtime as needed for sleep. (Patient not taking: Reported on 03/26/2024) 15 tablet 0   No current facility-administered medications for this visit.    Allergies as of 04/23/2024 - Review Complete 04/23/2024  Allergen Reaction Noted   Levofloxacin Hives and Rash    Penicillins Rash     Family History  Problem Relation Age of Onset   Colon polyps Mother    Hypertension Mother    Colon polyps Father    Hypertension Father    Colon polyps Brother    Pancreatic cancer Paternal Uncle    Liver disease Paternal Uncle    Colon cancer Maternal Grandmother    Diabetes Paternal Grandmother    Heart attack Cousin    Rectal cancer Neg Hx    Stomach cancer Neg Hx     Social History   Socioeconomic History   Marital status: Married    Spouse name: Not on file   Number of children: 3   Years of education: Not on file   Highest education level: Bachelor's degree (e.g., BA, AB, BS)  Occupational History   Occupation: Investment banker, corporate: [employer]  Tobacco Use   Smoking status: Never   Smokeless tobacco: Never  Vaping Use   Vaping status: Never Used  Substance and Sexual Activity   Alcohol use: Yes    Alcohol/week: 8.0 standard drinks of alcohol    Types: 8 Glasses of wine per week   Drug use: No   Sexual activity: Yes    Birth control/protection: None  Other Topics Concern   Not on file  Social History Narrative   Not on file   Social Drivers of Health   Financial Resource Strain: Low Risk  (03/26/2024)   Overall Financial Resource Strain (CARDIA)    Difficulty of Paying Living Expenses: Not hard at all  Food Insecurity: No Food Insecurity (03/26/2024)   Hunger Vital Sign    Worried About Running Out of Food in the Last Year: Never true    Ran Out of Food in the Last Year: Never true   Transportation Needs: No Transportation Needs (03/26/2024)   PRAPARE - Administrator, Civil Service (Medical): No    Lack of Transportation (Non-Medical): No  Physical Activity: Insufficiently Active (03/26/2024)   Exercise Vital Sign    Days of Exercise per Week: 2 days    Minutes of Exercise per Session: 20 min  Stress: No Stress Concern Present (03/26/2024)   [employer] of Occupational Health - Occupational Stress Questionnaire    Feeling of Stress : Only a little  Social Connections: Socially Integrated (03/26/2024)   Social Connection and Isolation Panel [NHANES]    Frequency of Communication with Friends and Family: More than three times a week    Frequency of Social Gatherings with Friends and Family: Once a week    Attends Religious Services: More than 4 times per year    Active Member of [employer] or Organizations: Yes    Attends Engineer, structural: More than 4 times per year    Marital Status: Married  Catering manager Violence: Not At Risk (03/26/2024)   Humiliation, Afraid, Rape, and Kick questionnaire    Fear of Current or Ex-Partner: No    Emotionally Abused: No    Physically Abused: No    Sexually Abused: No    Review of Systems:  All other review of systems negative except as mentioned in the HPI.  Physical Exam: Vital signs in last 24 hours: BP 129/75   Pulse 68   Temp 98 F (36.7 C)   Ht 5\' 11"  (1.803 m)   Wt 255 lb (115.7 kg)   SpO2 99%   BMI 35.57 kg/m  General:   Alert, NAD Lungs:  Clear .   Heart:  Regular rate and rhythm Abdomen:  Soft, nontender and nondistended. Neuro/Psych:  Alert and cooperative. Normal mood and affect. A and O x 3  Reviewed labs, radiology imaging, old records and pertinent past GI work up  Patient is appropriate for planned procedure(s) and anesthesia in an ambulatory setting   K. Veena Braylynn Ghan , MD [phone]

## 2024-04-23 NOTE — Progress Notes (Unsigned)
 Called to room to assist during endoscopic procedure.  Patient ID and intended procedure confirmed with present staff. Received instructions for my participation in the procedure from the performing physician.

## 2024-04-23 NOTE — Patient Instructions (Signed)
Please read handouts provided. Continue present medications. Await pathology results. Repeat colonoscopy in 3 years for screening.   YOU HAD AN ENDOSCOPIC PROCEDURE TODAY AT THE [facility]:   Refer to the procedure report that was given to you for any specific questions about what was found during the examination.  If the procedure report does not answer your questions, please call your gastroenterologist to clarify.  If you requested that your care partner not be given the details of your procedure findings, then the procedure report has been included in a sealed envelope for you to review at your convenience later.  YOU SHOULD EXPECT: Some feelings of bloating in the abdomen. Passage of more gas than usual.  Walking can help get rid of the air that was put into your GI tract during the procedure and reduce the bloating. If you had a lower endoscopy (such as a colonoscopy or flexible sigmoidoscopy) you may notice spotting of blood in your stool or on the toilet paper. If you underwent a bowel prep for your procedure, you may not have a normal bowel movement for a few days.  Please Note:  You might notice some irritation and congestion in your nose or some drainage.  This is from the oxygen used during your procedure.  There is no need for concern and it should clear up in a day or so.  SYMPTOMS TO REPORT IMMEDIATELY:  Following lower endoscopy (colonoscopy or flexible sigmoidoscopy):  Excessive amounts of blood in the stool  Significant tenderness or worsening of abdominal pains  Swelling of the abdomen that is new, acute  Fever of 100F or higher.  For urgent or emergent issues, a gastroenterologist can be reached at any hour by calling [phone]. Do not use MyChart messaging for urgent concerns.    DIET:  We do recommend a small meal at first, but then you may proceed to your regular diet.  Drink plenty of fluids but you should avoid alcoholic beverages for 24  hours.  ACTIVITY:  You should plan to take it easy for the rest of today and you should NOT DRIVE or use heavy machinery until tomorrow (because of the sedation medicines used during the test).    FOLLOW UP: Our staff will call the number listed on your records the next business day following your procedure.  We will call around 7:15- 8:00 am to check on you and address any questions or concerns that you may have regarding the information given to you following your procedure. If we do not reach you, we will leave a message.     If any biopsies were taken you will be contacted by phone or by letter within the next 1-3 weeks.  Please call us at [phone] if you have not heard about the biopsies in 3 weeks.    SIGNATURES/CONFIDENTIALITY: You and/or your care partner have signed paperwork which will be entered into your electronic medical record.  These signatures attest to the fact that that the information above on your After Visit Summary has been reviewed and is understood.  Full responsibility of the confidentiality of this discharge information lies with you and/or your care-partner.  

## 2024-04-23 NOTE — Op Note (Signed)
 [facility] Patient Name: Warren Hill Procedure Date: 04/23/2024 11:05 AM MRN: [id number] Endoscopist: Sergio Dandy , MD, [id number] Age: 68 Referring MD:  Date of Birth: 23-Mar-1956 Gender: Male Account #: [id number] Procedure:                Colonoscopy Indications:              High risk colon cancer surveillance: Personal                            history of colonic polyps Medicines:                Monitored Anesthesia Care Procedure:                Pre-Anesthesia Assessment:                           - Prior to the procedure, a History and Physical                            was performed, and patient medications and                            allergies were reviewed. The patient's tolerance of                            previous anesthesia was also reviewed. The risks                            and benefits of the procedure and the sedation                            options and risks were discussed with the patient.                            All questions were answered, and informed consent                            was obtained. Prior Anticoagulants: The patient has                            taken no anticoagulant or antiplatelet agents. ASA                            Grade Assessment: II - A patient with mild systemic                            disease. After reviewing the risks and benefits,                            the patient was deemed in satisfactory condition to                            undergo the procedure.  After obtaining informed consent, the colonoscope                            was passed under direct vision. Throughout the                            procedure, the patient's blood pressure, pulse, and                            oxygen saturations were monitored continuously. The                            Olympus Scope SN: [id number] was introduced through                            the anus and advanced to the the  cecum, identified                            by appendiceal orifice and ileocecal valve. The                            colonoscopy was performed without difficulty. The                            patient tolerated the procedure well. The quality                            of the bowel preparation was adequate to identify                            polyps greater than 5 mm in size. The ileocecal                            valve, appendiceal orifice, and rectum were                            photographed. Scope In: 11:14:04 AM Scope Out: 11:37:35 AM Scope Withdrawal Time: 0 hours 21 minutes 15 seconds  Total Procedure Duration: 0 hours 23 minutes 31 seconds  Findings:                 The perianal and digital rectal examinations were                            normal.                           Four sessile polyps were found in the sigmoid colon                            x 2 and appendiceal orifice x2. The polyps were 4                            to 10 mm in size. These polyps were removed with  a                            cold snare. Resection and retrieval were complete.                           Scattered small-mouthed diverticula were found in                            the sigmoid colon and descending colon.                           Non-bleeding external and internal hemorrhoids were                            found during retroflexion. The hemorrhoids were                            medium-sized. Complications:            No immediate complications. Estimated Blood Loss:     Estimated blood loss was minimal. Impression:               - Four 4 to 10 mm polyps in the sigmoid colon and                            at the appendiceal orifice, removed with a cold                            snare. Resected and retrieved.                           - Diverticulosis in the sigmoid colon and in the                            descending colon.                           - Non-bleeding external  and internal hemorrhoids. Recommendation:           - Patient has a contact number available for                            emergencies. The signs and symptoms of potential                            delayed complications were discussed with the                            patient. Return to normal activities tomorrow.                            Written discharge instructions were provided to the                            patient.                           -  Resume previous diet.                           - Continue present medications.                           - Await pathology results.                           - Repeat colonoscopy in 3 years for surveillance                            based on pathology results. Mihailo Sage V. Dannon Perlow, MD 04/23/2024 11:45:06 AM This report has been signed electronically.

## 2024-04-24 ENCOUNTER — Telehealth: Payer: Self-pay

## 2024-04-24 NOTE — Telephone Encounter (Signed)
 Attempted to call patient for post-procedure f/u call. No answer. Left message for him to please not hesitate to call if he has any questions/concerns regarding his care.

## 2024-05-06 LAB — SURGICAL PATHOLOGY

## 2024-05-08 ENCOUNTER — Encounter: Payer: Self-pay | Admitting: Internal Medicine

## 2024-05-17 ENCOUNTER — Ambulatory Visit: Admitting: Family Medicine

## 2024-05-17 VITALS — BP 122/70 | HR 69 | Temp 97.9°F | Resp 14 | Ht 71.0 in | Wt 260.8 lb

## 2024-05-17 DIAGNOSIS — Q5520 Unspecified congenital malformations of testis and scrotum: Secondary | ICD-10-CM

## 2024-05-17 DIAGNOSIS — R252 Cramp and spasm: Secondary | ICD-10-CM

## 2024-05-17 DIAGNOSIS — I10 Essential (primary) hypertension: Secondary | ICD-10-CM

## 2024-05-17 DIAGNOSIS — G4700 Insomnia, unspecified: Secondary | ICD-10-CM

## 2024-05-17 DIAGNOSIS — M79604 Pain in right leg: Secondary | ICD-10-CM | POA: Diagnosis not present

## 2024-05-17 DIAGNOSIS — E785 Hyperlipidemia, unspecified: Secondary | ICD-10-CM

## 2024-05-17 DIAGNOSIS — M25562 Pain in left knee: Secondary | ICD-10-CM

## 2024-05-17 DIAGNOSIS — M79605 Pain in left leg: Secondary | ICD-10-CM

## 2024-05-17 MED ORDER — ZOLPIDEM TARTRATE 5 MG PO TABS: 5.0000 mg | ORAL_TABLET | Freq: Every evening | ORAL | 1 refills | Status: AC | PRN

## 2024-05-17 NOTE — Patient Instructions (Addendum)
 Call ortho for follow up on knee and treatment options. Ideally short course of meloxicam , but can be used for now if needed.   I will check some labs today but I think it is reasonable to stop the cholesterol medicine for about 2 weeks to see if any change in leg pains or aches.  If no change, then can restart medication.  Lets follow-up in 1 month and decide the next step for leg pains if still persistent.  I will refer you to urology to discuss the scrotal concerns but I do not see any definite concerns on exam today.  I would like them to meet with you for a second opinion.  If any concerns on labs I will let you know.  Continue same dose of blood pressure medication.  I did refill the Ambien  for travel.  Thanks for coming in today and take care.

## 2024-05-17 NOTE — Progress Notes (Signed)
 Subjective:  Patient ID: Warren Hill, male    DOB: 1956-06-30  Age: 68 y.o. MRN: [id number]  CC:  Chief Complaint  Patient presents with   Medical Management of Chronic Issues    Pt is not fasting, pt notes still dealing with leg pain    Leg Pain    Pt notes continued intermittent pain in his legs since having COVID in 2020 but seems to be worsening again    skin concern    Pt has questions about the skin of his scrotum     HPI Warren Hill presents for initially planned chronic condition follow-up but acute concerns as above.  Left knee pain: Discussed April 9.  Ongoing symptoms, had been seen by Ortho previously.  Intermittent left knee pain.  Suspected degenerative meniscal changes, previous imaging without significant arthritic disease.  When he has seen Ortho previously recommended follow-up in 4 to 6 weeks if persistent symptoms for possible injection.  Plan at his April visit was to follow-up with Ortho to discuss possible injection or other treatments.  Has not seen orhto yet - no appt yet. Plans to go out of country in September, plans to see prior for possible injection. More sore with exercise.  No locking/giving way.  Tx: mobic  15mg  - with more activity. 4 times per week.   Scrotal skin concern Thicker skin diffusely, and feels like more fluid within scrotum. Feel like testicles are sitting up higher. Noticed past month. No pain. No dysuria/hematuria. No hematemesis. No recent urology eval.   Bilateral leg pain Bilateral leg pains in the past after COVID infection.  Calf pain, swelling, multiple negative previous ultrasounds and vascular eval.  Thought to be soft tissue issue with the recommended stretching, minimal improvement with PT previously.  Cramps in the muscles in the morning in the past then improved with stretches and during the day.   Had been better, R leg has always been more prominent. Past few months seem sot be getting more sore.  More stiffness in legs,  more cramping in legs past month. No new swelling. No chest pain or dyspnea.   Hyperlipidemia: Crestor  5mg  every day. No missed doses. Due for follow up for cardiology - plans to schedule follow up and discuss statin to see if this is causing aches. Has not tried stopping. Not fasting today. Requests lab appt next week.  Lab Results  Component Value Date   CHOL 128 10/18/2023   HDL 47.60 10/18/2023   LDLCALC 58 10/18/2023   TRIG 114.0 10/18/2023   CHOLHDL 3 10/18/2023   Lab Results  Component Value Date   ALT 21 10/18/2023   AST 20 10/18/2023   ALKPHOS 53 10/18/2023   BILITOT 1.0 10/18/2023   Hypertension: Lisinopril  10mg  every day. Nn new cough/side effects.  Home readings: 120's/70's.  BP Readings from Last 3 Encounters:  05/17/24 122/70  04/23/24 116/78  03/27/24 118/74   Lab Results  Component Value Date   CREATININE 0.85 10/18/2023   Insomnia With travel. Ambien  in past effective.no recent need. Denies se's on meds.  Database reviewed.  Last listing of 04/13/2023 #15 Ambien  No. 5 milligrams, no concerns.    History Patient Active Problem List   Diagnosis Date Noted   Floaters, right 07/28/2023   Floaters, left 12/30/2022   Cataracts, both eyes 12/22/2022   Calf swelling 02/04/2022   Nerve pain 02/04/2022   HTN (hypertension) 01/18/2012   Insomnia 01/18/2012   Tinnitus 01/18/2012   GERD 05/28/2008   History  of colonic polyps 05/28/2008   Past Medical History:  Diagnosis Date   Adenomatous polyp of colon    GERD (gastroesophageal reflux disease)    Hypertension    Tinnitus of both ears    Past Surgical History:  Procedure Laterality Date   CATARACT EXTRACTION  11/2022   COLONOSCOPY     KNEE ARTHROSCOPY Left 11/2020   TONSILLECTOMY AND ADENOIDECTOMY  12/19/1960   Allergies  Allergen Reactions   Levofloxacin Hives and Rash    Skin got like leather   Penicillins Rash   Prior to Admission medications   Medication Sig Start Date End Date Taking?  Authorizing Provider  lisinopril  (ZESTRIL ) 10 MG tablet TAKE 1 TABLET BY MOUTH EVERY DAY 04/09/24  Yes Benjiman Bras, MD  meloxicam  (MOBIC ) 15 MG tablet Take 15 mg by mouth daily as needed for pain. 03/11/24  Yes [provider]  multivitamin-lutein (OCUVITE-LUTEIN) CAPS capsule Take 2 capsules by mouth daily.   Yes [provider]  rosuvastatin  (CRESTOR ) 5 MG tablet TAKE 1 TABLET (5 MG TOTAL) BY MOUTH DAILY. 04/09/24  Yes Benjiman Bras, MD   Social History   Socioeconomic History   Marital status: Married    Spouse name: Not on file   Number of children: 3   Years of education: Not on file   Highest education level: Bachelor's degree (e.g., BA, AB, BS)  Occupational History   Occupation: Investment banker, corporate: [employer]  Tobacco Use   Smoking status: Never   Smokeless tobacco: Never  Vaping Use   Vaping status: Never Used  Substance and Sexual Activity   Alcohol use: Yes    Alcohol/week: 8.0 standard drinks of alcohol    Types: 8 Glasses of wine per week   Drug use: No   Sexual activity: Yes    Birth control/protection: None  Other Topics Concern   Not on file  Social History Narrative   Not on file   Social Drivers of Health   Financial Resource Strain: Low Risk  (03/26/2024)   Overall Financial Resource Strain (CARDIA)    Difficulty of Paying Living Expenses: Not hard at all  Food Insecurity: No Food Insecurity (03/26/2024)   Hunger Vital Sign    Worried About Running Out of Food in the Last Year: Never true    Ran Out of Food in the Last Year: Never true  Transportation Needs: No Transportation Needs (03/26/2024)   PRAPARE - Administrator, Civil Service (Medical): No    Lack of Transportation (Non-Medical): No  Physical Activity: Insufficiently Active (03/26/2024)   Exercise Vital Sign    Days of Exercise per Week: 2 days    Minutes of Exercise per Session: 20 min  Stress: No Stress Concern Present (03/26/2024)   [employer] of  Occupational Health - Occupational Stress Questionnaire    Feeling of Stress : Only a little  Social Connections: Socially Integrated (03/26/2024)   Social Connection and Isolation Panel [NHANES]    Frequency of Communication with Friends and Family: More than three times a week    Frequency of Social Gatherings with Friends and Family: Once a week    Attends Religious Services: More than 4 times per year    Active Member of [employer] or Organizations: Yes    Attends Banker Meetings: More than 4 times per year    Marital Status: Married  Catering manager Violence: Not At Risk (03/26/2024)   Humiliation, Afraid, Rape, and Kick questionnaire  Fear of Current or Ex-Partner: No    Emotionally Abused: No    Physically Abused: No    Sexually Abused: No    Review of Systems  Constitutional:  Negative for fatigue and unexpected weight change.  Eyes:  Negative for visual disturbance.  Respiratory:  Negative for cough, chest tightness and shortness of breath.   Cardiovascular:  Negative for chest pain, palpitations and leg swelling.  Gastrointestinal:  Negative for abdominal pain and blood in stool.  Neurological:  Negative for dizziness, light-headedness and headaches.     Objective:   Vitals:   05/17/24 0836  BP: 122/70  Pulse: 69  Resp: 14  Temp: 97.9 F (36.6 C)  TempSrc: Temporal  SpO2: 98%  Weight: 260 lb 12.8 oz (118.3 kg)  Height: 5\' 11"  (1.803 m)     Physical Exam Vitals reviewed.  Constitutional:      Appearance: He is well-developed.  HENT:     Head: Normocephalic and atraumatic.  Neck:     Vascular: No carotid bruit or JVD.  Cardiovascular:     Rate and Rhythm: Normal rate and regular rhythm.     Heart sounds: Normal heart sounds. No murmur heard. Pulmonary:     Effort: Pulmonary effort is normal.     Breath sounds: Normal breath sounds. No rales.  Genitourinary:    Comments: Slight thickening of scrotal skin diffusely but no erythema, no rash, no  wounds.  No appreciable mass or tenderness of testicles.  No hernia appreciated.  Nontender on exam.  No penile rash or discharge. Musculoskeletal:     Right lower leg: No edema.     Left lower leg: No edema.     Comments: Negative Homans bilaterally, some diffuse discomfort, mild of lower extremities, anterior legs, posterior lower legs bilaterally.  Chronic prominence of right versus left calf but no focal calf tenderness or cords.   Skin:    General: Skin is warm and dry.  Neurological:     Mental Status: He is alert and oriented to person, place, and time.  Psychiatric:        Mood and Affect: Mood normal.        Assessment & Plan:  Warren Hill is a 68 y.o. male . Left knee pain, unspecified chronicity `-Follow-up with Ortho.  Intermittent meloxicam  for now.  Pain in both lower extremities - Plan: CK, Magnesium Leg cramps - Plan: CK, Magnesium  - Prior symptoms after COVID, with testing and specialist eval at that time.  Symptoms have improved and started to recur recently.  Could potentially be related to statin use, will try coming off statin temporarily, check CK, magnesium and close follow-up.  Can also discuss with Ortho but plan to follow-up to discuss next step in treatment including possible evaluation with other specialist.  RTC/ER precautions if acute changes.  Essential hypertension - Plan: Comprehensive metabolic panel with GFR  - Stable with current regimen, continue same, check labs and adjust plan accordingly  Hyperlipidemia, unspecified hyperlipidemia type - Plan: Comprehensive metabolic panel with GFR, Lipid panel  - Check labs but again we will try holding statin temporarily to see if that could be contributing to leg symptoms.  Insomnia, unspecified type - Plan: zolpidem  (AMBIEN ) 5 MG tablet  - Infrequent need for Ambien , refilled.  Potential side effects and risks have been discussed.  Scrotal anomaly - Plan: Ambulatory referral to Urology  - Slight  thickening of the skin and suggestive of elevation of testicles compared to previous.  I do not appreciate a specific mass or localized swelling, no apparent skin rash.  Will refer to urology for secondary exam but could be normal.   Meds ordered this encounter  Medications   zolpidem  (AMBIEN ) 5 MG tablet    Sig: Take 1 tablet (5 mg total) by mouth at bedtime as needed for sleep.    Dispense:  15 tablet    Refill:  1   Patient Instructions  Call ortho for follow up on knee and treatment options. Ideally short course of meloxicam , but can be used for now if needed.   I will check some labs today but I think it is reasonable to stop the cholesterol medicine for about 2 weeks to see if any change in leg pains or aches.  If no change, then can restart medication.  Lets follow-up in 1 month and decide the next step for leg pains if still persistent.  I will refer you to urology to discuss the scrotal concerns but I do not see any definite concerns on exam today.  I would like them to meet with you for a second opinion.  If any concerns on labs I will let you know.  Continue same dose of blood pressure medication.  I did refill the Ambien  for travel.  Thanks for coming in today and take care.    Signed,   Caro Christmas, MD [facility], [facility] 05/17/24 9:41 AM

## 2024-05-18 ENCOUNTER — Encounter: Payer: Self-pay | Admitting: Family Medicine

## 2024-05-22 DIAGNOSIS — M25562 Pain in left knee: Secondary | ICD-10-CM | POA: Diagnosis not present

## 2024-05-24 ENCOUNTER — Other Ambulatory Visit

## 2024-05-27 ENCOUNTER — Other Ambulatory Visit (INDEPENDENT_AMBULATORY_CARE_PROVIDER_SITE_OTHER)

## 2024-05-27 ENCOUNTER — Ambulatory Visit: Payer: Self-pay | Admitting: Gastroenterology

## 2024-05-27 DIAGNOSIS — M79604 Pain in right leg: Secondary | ICD-10-CM | POA: Diagnosis not present

## 2024-05-27 DIAGNOSIS — I10 Essential (primary) hypertension: Secondary | ICD-10-CM

## 2024-05-27 DIAGNOSIS — E785 Hyperlipidemia, unspecified: Secondary | ICD-10-CM | POA: Diagnosis not present

## 2024-05-27 DIAGNOSIS — M79605 Pain in left leg: Secondary | ICD-10-CM

## 2024-05-27 DIAGNOSIS — R252 Cramp and spasm: Secondary | ICD-10-CM

## 2024-05-27 LAB — COMPREHENSIVE METABOLIC PANEL WITH GFR
ALT: 21 U/L (ref 0–53)
AST: 21 U/L (ref 0–37)
Albumin: 4.3 g/dL (ref 3.5–5.2)
Alkaline Phosphatase: 47 U/L (ref 39–117)
BUN: 14 mg/dL (ref 6–23)
CO2: 26 meq/L (ref 19–32)
Calcium: 9.1 mg/dL (ref 8.4–10.5)
Chloride: 105 meq/L (ref 96–112)
Creatinine, Ser: 0.83 mg/dL (ref 0.40–1.50)
GFR: 89.72 mL/min (ref >60.00)
Glucose, Bld: 93 mg/dL (ref 70–99)
Potassium: 4.4 meq/L (ref 3.5–5.1)
Sodium: 138 meq/L (ref 135–145)
Total Bilirubin: 1.1 mg/dL (ref 0.2–1.2)
Total Protein: 6.9 g/dL (ref 6.0–8.3)

## 2024-05-27 LAB — LIPID PANEL
Cholesterol: 150 mg/dL (ref 0–200)
HDL: 39.2 mg/dL (ref >39.00)
LDL Cholesterol: 90 mg/dL (ref 0–99)
NonHDL: 111.25
Total CHOL/HDL Ratio: 4
Triglycerides: 105 mg/dL (ref 0.0–149.0)
VLDL: 21 mg/dL (ref 0.0–40.0)

## 2024-05-27 LAB — MAGNESIUM: Magnesium: 2.3 mg/dL (ref 1.5–2.5)

## 2024-05-27 LAB — CK: Total CK: 191 U/L (ref 7–232)

## 2024-05-28 ENCOUNTER — Ambulatory Visit: Payer: Self-pay | Admitting: Family Medicine

## 2024-06-17 ENCOUNTER — Ambulatory Visit (INDEPENDENT_AMBULATORY_CARE_PROVIDER_SITE_OTHER): Admitting: Family Medicine

## 2024-06-17 VITALS — BP 128/70 | HR 83 | Temp 98.2°F | Resp 16 | Ht 71.0 in | Wt 256.3 lb

## 2024-06-17 DIAGNOSIS — R252 Cramp and spasm: Secondary | ICD-10-CM | POA: Diagnosis not present

## 2024-06-17 DIAGNOSIS — R140 Abdominal distension (gaseous): Secondary | ICD-10-CM

## 2024-06-17 DIAGNOSIS — M79604 Pain in right leg: Secondary | ICD-10-CM

## 2024-06-17 DIAGNOSIS — R634 Abnormal weight loss: Secondary | ICD-10-CM

## 2024-06-17 DIAGNOSIS — R1011 Right upper quadrant pain: Secondary | ICD-10-CM | POA: Diagnosis not present

## 2024-06-17 DIAGNOSIS — M79605 Pain in left leg: Secondary | ICD-10-CM | POA: Diagnosis not present

## 2024-06-17 DIAGNOSIS — R1013 Epigastric pain: Secondary | ICD-10-CM

## 2024-06-17 DIAGNOSIS — M25562 Pain in left knee: Secondary | ICD-10-CM

## 2024-06-17 LAB — COMPREHENSIVE METABOLIC PANEL WITH GFR
ALT: 21 U/L (ref 0–53)
AST: 22 U/L (ref 0–37)
Albumin: 4.5 g/dL (ref 3.5–5.2)
Alkaline Phosphatase: 54 U/L (ref 39–117)
BUN: 11 mg/dL (ref 6–23)
CO2: 24 meq/L (ref 19–32)
Calcium: 9.4 mg/dL (ref 8.4–10.5)
Chloride: 104 meq/L (ref 96–112)
Creatinine, Ser: 0.83 mg/dL (ref 0.40–1.50)
GFR: 89.68 mL/min (ref >60.00)
Glucose, Bld: 115 mg/dL — ABNORMAL HIGH (ref 70–99)
Potassium: 4.1 meq/L (ref 3.5–5.1)
Sodium: 137 meq/L (ref 135–145)
Total Bilirubin: 1.1 mg/dL (ref 0.2–1.2)
Total Protein: 7.5 g/dL (ref 6.0–8.3)

## 2024-06-17 LAB — CBC
HCT: 46.4 % (ref 39.0–52.0)
Hemoglobin: 15.9 g/dL (ref 13.0–17.0)
MCHC: 34.3 g/dL (ref 30.0–36.0)
MCV: 92.6 fl (ref 78.0–100.0)
Platelets: 145 10*3/uL — ABNORMAL LOW (ref 150.0–400.0)
RBC: 5.02 Mil/uL (ref 4.22–5.81)
RDW: 13.2 % (ref 11.5–15.5)
WBC: 6.2 10*3/uL (ref 4.0–10.5)

## 2024-06-17 LAB — SEDIMENTATION RATE: Sed Rate: 23 mm/h — ABNORMAL HIGH (ref 0–20)

## 2024-06-17 LAB — LIPASE: Lipase: 13 U/L (ref 11.0–59.0)

## 2024-06-17 NOTE — Progress Notes (Unsigned)
 Subjective:  Patient ID: Warren Hill, male    DOB: [birth year]  Age: 68 y.o. MRN: [id number]  CC:  Chief Complaint  Patient presents with   Leg Pain    Pt notes doing okay but sxs are about the same, he is having a lot of cramping and stabbing pains in his calf now as well   Abdominal Pain    Pt notes has been having a lot of trouble with his stomach this week went to [place]  and is now having a lot of abdominal pain, cramping hard stomach, notes normal bowel movements.  If only one concern can be addressed today patient is requesting to discuss abdominal pain     HPI Warren Hill presents for   2 concerns as above.  Leg pains See previous notes.  Last discussed May 30.  Bilateral leg pains in the past after COVID infection.  He had multiple negative previous ultrasounds and a vascular eval.  Thought to be soft tissue issue with recommended stretching and minimal improvement with prior PT.  He did notice some cramping in the muscles in the morning in the past and improved with stretches and during the day.  At his last visit had noted more soreness in the legs and cramping in the past few months.  Denied new swelling chest pain dyspnea.  Recommended stopping his statin medication temporarily to see if that may be contributing.  Magnesium level was normal.  Potassium and other electrolytes were normal.   Stopped statin for a month. Initially some improvement in pain, then recurred. Plans to restart statin. Shin splints better. Still some shooting pain behind knee into calf and sore/stiff in ankles a times.   Trying to walk a mile per day. Less soreness after walking, but sore in knee to lift left leg.  Prior left knee surgery.  Saw ortho. Offered injection in left knee - he initially deferred, may reconsider.  Annoying, not taking any meds. Has mobic  if needed - helps   Abdominal pain Started with bloating 4 days ago, sure upper abdomen, belching, indigestion.  Normal BM until diarrhea  once today.  No n/v/fever.  Gallbladder removal in father, uncle, grandmother.  Has lost 5# since last visit. Rare night sweats, not persistent.  Improving today.  Able to eat/drink ok. No urinary sx's.  Rare pepcid with certain foods only - 2 times per month.   Last ate 3 hours ago.   Tx: none.  History Patient Active Problem List   Diagnosis Date Noted   Floaters, right 07/28/2023   Floaters, left 12/30/2022   Cataracts, both eyes 12/22/2022   Calf swelling 02/04/2022   Nerve pain 02/04/2022   HTN (hypertension) 01/18/2012   Insomnia 01/18/2012   Tinnitus 01/18/2012   GERD 05/28/2008   History of colonic polyps 05/28/2008   Past Medical History:  Diagnosis Date   Adenomatous polyp of colon    GERD (gastroesophageal reflux disease)    Hypertension    Tinnitus of both ears    Past Surgical History:  Procedure Laterality Date   CATARACT EXTRACTION  11/2022   COLONOSCOPY     KNEE ARTHROSCOPY Left 11/2020   TONSILLECTOMY AND ADENOIDECTOMY  12/19/1960   Allergies  Allergen Reactions   Levofloxacin Hives and Rash    Skin got like leather   Penicillins Rash   Prior to Admission medications   Medication Sig Start Date End Date Taking? Authorizing Provider  lisinopril  (ZESTRIL ) 10 MG tablet TAKE 1 TABLET BY MOUTH EVERY  DAY 04/09/24  Yes Levora Reyes SAUNDERS, MD  meloxicam  (MOBIC ) 15 MG tablet Take 15 mg by mouth daily as needed for pain. 03/11/24  Yes [provider]  multivitamin-lutein (OCUVITE-LUTEIN) CAPS capsule Take 2 capsules by mouth daily.   Yes [provider]  zolpidem  (AMBIEN ) 5 MG tablet Take 1 tablet (5 mg total) by mouth at bedtime as needed for sleep. 05/17/24  Yes Levora Reyes SAUNDERS, MD  rosuvastatin  (CRESTOR ) 5 MG tablet TAKE 1 TABLET (5 MG TOTAL) BY MOUTH DAILY. Patient not taking: Reported on 06/17/2024 04/09/24   Levora Reyes SAUNDERS, MD   Social History   Socioeconomic History   Marital status: Married    Spouse name: Not on file   Number  of children: 3   Years of education: Not on file   Highest education level: Bachelor's degree (e.g., BA, AB, BS)  Occupational History   Occupation: Investment banker, corporate: [employer]  Tobacco Use   Smoking status: Never   Smokeless tobacco: Never  Vaping Use   Vaping status: Never Used  Substance and Sexual Activity   Alcohol use: Yes    Alcohol/week: 8.0 standard drinks of alcohol    Types: 8 Glasses of wine per week   Drug use: No   Sexual activity: Yes    Birth control/protection: None  Other Topics Concern   Not on file  Social History Narrative   Not on file   Social Drivers of Health   Financial Resource Strain: Low Risk  (06/16/2024)   Overall Financial Resource Strain (CARDIA)    Difficulty of Paying Living Expenses: Not hard at all  Food Insecurity: No Food Insecurity (06/16/2024)   Hunger Vital Sign    Worried About Running Out of Food in the Last Year: Never true    Ran Out of Food in the Last Year: Never true  Transportation Needs: No Transportation Needs (06/16/2024)   PRAPARE - Administrator, Civil Service (Medical): No    Lack of Transportation (Non-Medical): No  Physical Activity: Insufficiently Active (06/16/2024)   Exercise Vital Sign    Days of Exercise per Week: 2 days    Minutes of Exercise per Session: 40 min  Stress: No Stress Concern Present (06/16/2024)   [employer] of Occupational Health - Occupational Stress Questionnaire    Feeling of Stress: Only a little  Social Connections: Socially Integrated (06/16/2024)   Social Connection and Isolation Panel    Frequency of Communication with Friends and Family: More than three times a week    Frequency of Social Gatherings with Friends and Family: Once a week    Attends Religious Services: More than 4 times per year    Active Member of [employer] or Organizations: Yes    Attends Engineer, structural: More than 4 times per year    Marital Status: Married  Catering manager Violence: Not  At Risk (03/26/2024)   Humiliation, Afraid, Rape, and Kick questionnaire    Fear of Current or Ex-Partner: No    Emotionally Abused: No    Physically Abused: No    Sexually Abused: No    Review of Systems   Objective:   Vitals:   06/17/24 0956  BP: 128/70  Pulse: 83  Resp: 16  Temp: 98.2 F (36.8 C)  TempSrc: Temporal  SpO2: 97%  Weight: 256 lb 4.8 oz (116.3 kg)  Height: 5' 11 (1.803 m)     Physical Exam Vitals reviewed.  Constitutional:  General: He is not in acute distress.    Appearance: Normal appearance. He is well-developed. He is not ill-appearing.  HENT:     Head: Normocephalic and atraumatic.  Neck:     Vascular: No carotid bruit or JVD.   Cardiovascular:     Rate and Rhythm: Normal rate and regular rhythm.     Heart sounds: Normal heart sounds. No murmur heard. Pulmonary:     Effort: Pulmonary effort is normal.     Breath sounds: Normal breath sounds. No rales.  Abdominal:     Comments: Prominent, question slight distention.  Minimal discomfort epigastric, right upper quadrant, negative Murphy's, no rebound or guarding.   Musculoskeletal:     Right lower leg: No edema.     Left lower leg: No edema.     Comments: Lower extremities bilaterally, no cords on calves, negative Homans.  Skin intact without erythema.   Skin:    General: Skin is warm and dry.   Neurological:     Mental Status: He is alert and oriented to person, place, and time.   Psychiatric:        Mood and Affect: Mood normal.      Assessment & Plan:  Emaad Nanna is a 68 y.o. male . Pain in both lower extremities - Plan: Ambulatory referral to Physical Medicine Rehab Leg cramps - Plan: Ambulatory referral to Physical Medicine Rehab Left knee pain, unspecified chronicity  - Appears to have 2 separate areas of discomfort, left knee pain which has been evaluated by orthopedics.  Recommended follow-up with Ortho to decide on injection or other treatments.  Intermittent meloxicam   reasonable for now. Recurrent lower extremity/calf pains, cramps have been present since prior COVID infection.  Initially treated for possible DVT clinically, but never had a documented DVT on imaging.  Unsure if this could be a post-COVID syndrome/myopathy.  Temporary improvement with some previous treatments, but now recurrence of symptoms, reassuring labs as above.  I would like to see if he can meet with physical medicine rehab to see if they recommend other modalities or further testing.  Referral placed.  Epigastric abdominal pain - Plan: CBC, Comprehensive metabolic panel with GFR, Lipase, US  Abdomen Limited RUQ (LIVER/GB) RUQ abdominal pain - Plan: CBC, Comprehensive metabolic panel with GFR, US  Abdomen Limited RUQ (LIVER/GB) Bloating - Plan: CBC, Comprehensive metabolic panel with GFR Weight loss - Plan: CBC, Sedimentation rate, Lipase  - some improvement on day of visit.  However given symptoms above we will check CBC, CMP, lipase.  Trial of Pepcid or omeprazole over-the-counter for possible gastritis component.  Check ultrasound to rule out gallbladder/liver cause.  ER/RTC precautions given.  No orders of the defined types were placed in this encounter.  Patient Instructions  Ok to try mobic  for a week or two at a time.  I will refer you to PMR specialist to see if they have anything to offer for the leg pains.  If persistent knee pain - I do recommend follow up with your surgeon for possible injection.   Glad to hear that some of the abdominal symptoms have improved today but I am checking some lab work today to look at your pancreas, gallbladder, infection fighting cells, and will order an ultrasound to look at the liver and gallbladder.  If you want to try Pepcid over-the-counter or omeprazole over-the-counter temporarily I think that is fine.  If any worsening symptoms, be seen.  Please let me know if there are questions and thanks for coming  in today.  Abdominal Pain,  Adult  Pain in the abdomen (abdominal pain) can be caused by many things. In most cases, it gets better with no treatment or by being treated at home. But in some cases, it can be serious. Your health care provider will ask questions about your medical history and do a physical exam to try to figure out what is causing your pain. Follow these instructions at home: Medicines Take over-the-counter and prescription medicines only as told by your provider. Do not take medicines that help you poop (laxatives) unless told by your provider. General instructions Watch your condition for any changes. Drink enough fluid to keep your pee (urine) pale yellow. Contact a health care provider if: Your pain changes, gets worse, or lasts longer than expected. You have severe cramping or bloating in your abdomen, or you vomit. Your pain gets worse with meals, after eating, or with certain foods. You are constipated or have diarrhea for more than 2-3 days. You are not hungry, or you lose weight without trying. You have signs of dehydration. These may include: Dark pee, very little pee, or no pee. Cracked lips or dry mouth. Sleepiness or weakness. You have pain when you pee (urinate) or poop. Your abdominal pain wakes you up at night. You have blood in your pee. You have a fever. Get help right away if: You cannot stop vomiting. Your pain is only in one part of the abdomen. Pain on the right side could be caused by appendicitis. You have bloody or black poop (stool), or poop that looks like tar. You have trouble breathing. You have chest pain. These symptoms may be an emergency. Get help right away. Call 911. Do not wait to see if the symptoms will go away. Do not drive yourself to the hospital. This information is not intended to replace advice given to you by your health care provider. Make sure you discuss any questions you have with your health care provider. Document Revised: 09/21/2022 Document  Reviewed: 09/21/2022 Elsevier Patient Education  2024 Elsevier Inc.    Signed,   Reyes Pines, MD [facility], [facility] 06/17/24 10:45 AM

## 2024-06-17 NOTE — Patient Instructions (Addendum)
 Ok to try mobic  for a week or two at a time.  I will refer you to PMR specialist to see if they have anything to offer for the leg pains.  If persistent knee pain - I do recommend follow up with your surgeon for possible injection.   Glad to hear that some of the abdominal symptoms have improved today but I am checking some lab work today to look at your pancreas, gallbladder, infection fighting cells, and will order an ultrasound to look at the liver and gallbladder.  If you want to try Pepcid over-the-counter or omeprazole over-the-counter temporarily I think that is fine.  If any worsening symptoms, be seen.  Please let me know if there are questions and thanks for coming in today.  Abdominal Pain, Adult  Pain in the abdomen (abdominal pain) can be caused by many things. In most cases, it gets better with no treatment or by being treated at home. But in some cases, it can be serious. Your health care provider will ask questions about your medical history and do a physical exam to try to figure out what is causing your pain. Follow these instructions at home: Medicines Take over-the-counter and prescription medicines only as told by your provider. Do not take medicines that help you poop (laxatives) unless told by your provider. General instructions Watch your condition for any changes. Drink enough fluid to keep your pee (urine) pale yellow. Contact a health care provider if: Your pain changes, gets worse, or lasts longer than expected. You have severe cramping or bloating in your abdomen, or you vomit. Your pain gets worse with meals, after eating, or with certain foods. You are constipated or have diarrhea for more than 2-3 days. You are not hungry, or you lose weight without trying. You have signs of dehydration. These may include: Dark pee, very little pee, or no pee. Cracked lips or dry mouth. Sleepiness or weakness. You have pain when you pee (urinate) or poop. Your abdominal  pain wakes you up at night. You have blood in your pee. You have a fever. Get help right away if: You cannot stop vomiting. Your pain is only in one part of the abdomen. Pain on the right side could be caused by appendicitis. You have bloody or black poop (stool), or poop that looks like tar. You have trouble breathing. You have chest pain. These symptoms may be an emergency. Get help right away. Call 911. Do not wait to see if the symptoms will go away. Do not drive yourself to the hospital. This information is not intended to replace advice given to you by your health care provider. Make sure you discuss any questions you have with your health care provider. Document Revised: 09/21/2022 Document Reviewed: 09/21/2022 Elsevier Patient Education  2024 [employer].

## 2024-06-18 ENCOUNTER — Encounter: Payer: Self-pay | Admitting: Family Medicine

## 2024-06-18 ENCOUNTER — Inpatient Hospital Stay
Admission: RE | Admit: 2024-06-18 | Discharge: 2024-06-18 | Discharge status: Home / Self Care | Source: Ambulatory Visit | Attending: Family Medicine | Admitting: Family Medicine

## 2024-06-18 ENCOUNTER — Ambulatory Visit: Payer: Self-pay | Admitting: Family Medicine

## 2024-06-18 DIAGNOSIS — R1013 Epigastric pain: Secondary | ICD-10-CM

## 2024-06-18 DIAGNOSIS — R1011 Right upper quadrant pain: Secondary | ICD-10-CM | POA: Diagnosis not present

## 2024-06-18 DIAGNOSIS — K8020 Calculus of gallbladder without cholecystitis without obstruction: Secondary | ICD-10-CM | POA: Diagnosis not present

## 2024-06-30 NOTE — Progress Notes (Signed)
 Chief Complaint: No chief complaint on file.   History of Present Illness:  Warren Hill is a 68 y.o. male who is seen in consultation from Warren Reyes SAUNDERS, MD for evaluation of scrotal abnormality.  For several months he has noted some swelling of his scrotum.  It is not painful or bothersome except maybe when he lies down on his side at night.  He is having no difficulty with urination.  PSA has been checked this year and is normal.  He denies any prior urologic issues.   Past Medical History:  Past Medical History:  Diagnosis Date   Adenomatous polyp of colon    GERD (gastroesophageal reflux disease)    Hypertension    Tinnitus of both ears     Past Surgical History:  Past Surgical History:  Procedure Laterality Date   CATARACT EXTRACTION  11/2022   COLONOSCOPY     KNEE ARTHROSCOPY Left 11/2020   TONSILLECTOMY AND ADENOIDECTOMY  12/19/1960    Allergies:  Allergies  Allergen Reactions   Levofloxacin Hives and Rash    Skin got like leather   Penicillins Rash    Family History:  Family History  Problem Relation Age of Onset   Colon polyps Mother    Hypertension Mother    Colon polyps Father    Hypertension Father    Colon polyps Brother    Pancreatic cancer Paternal Uncle    Liver disease Paternal Uncle    Colon cancer Maternal Grandmother    Diabetes Paternal Grandmother    Heart attack Cousin    Rectal cancer Neg Hx    Stomach cancer Neg Hx     Social History:  Social History   Tobacco Use   Smoking status: Never   Smokeless tobacco: Never  Vaping Use   Vaping status: Never Used  Substance Use Topics   Alcohol use: Yes    Alcohol/week: 8.0 standard drinks of alcohol    Types: 8 Glasses of wine per week   Drug use: No    Review of symptoms:  Constitutional:  Negative for unexplained weight loss, night sweats, fever, chills ENT:  Negative for nose bleeds, sinus pain, painful swallowing CV:  Negative for chest pain, shortness of breath,  exercise intolerance, palpitations, loss of consciousness Resp:  Negative for cough, wheezing, shortness of breath GI:  Negative for nausea, vomiting, diarrhea, bloody stools GU:  Positives noted in HPI; otherwise negative for gross hematuria, dysuria, urinary incontinence Neuro:  Negative for seizures, poor balance, limb weakness, slurred speech Psych:  Negative for lack of energy, depression, anxiety Endocrine:  Negative for polydipsia, polyuria, symptoms of hypoglycemia (dizziness, hunger, sweating) Hematologic:  Negative for anemia, purpura, petechia, prolonged or excessive bleeding, use of anticoagulants  Allergic:  Negative for difficulty breathing or choking as a result of exposure to anything; no shellfish allergy; no allergic response (rash/itch) to materials, foods  Physical exam: There were no vitals taken for this visit. GENERAL APPEARANCE:  Well appearing, well developed, well nourished, NAD HEENT: Atraumatic, Normocephalic. NECK: Normal appearance LUNGS: Normal inspiratory and expiratory excursion HEART: Regular Rate ABDOMEN: No inguinal hernias GU: Phallus normal, no lesions. Scrotal skin normal. Testicles/epididymal structures normal. Meatus normal.  There are small bilateral hydroceles. EXTREMITIES: Moves all extremities well.  Without clubbing, cyanosis, or edema. NEUROLOGIC:  Alert and oriented x 3, normal gait, CN II-XII grossly intact.  MENTAL STATUS:  Appropriate. SKIN:  Warm, dry and intact.    Results:  I have reviewed urinalysis  I  have reviewed PSA results  I have reviewed prior imaging  Assessment: Small, relatively asymptomatic bilateral hydroceles   Plan: I discussed hydroceles with him, and the etiology which are basically unknown.  As he is not having significant symptoms from this, and he is reassured, he will return as needed

## 2024-07-01 ENCOUNTER — Ambulatory Visit: Admitting: Urology

## 2024-07-01 VITALS — BP 119/77 | HR 76 | Ht 71.0 in | Wt 255.0 lb

## 2024-07-01 DIAGNOSIS — N433 Hydrocele, unspecified: Secondary | ICD-10-CM | POA: Diagnosis not present

## 2024-07-01 DIAGNOSIS — N5089 Other specified disorders of the male genital organs: Secondary | ICD-10-CM

## 2024-07-01 LAB — URINALYSIS, ROUTINE W REFLEX MICROSCOPIC
Bilirubin, UA: NEGATIVE
Glucose, UA: NEGATIVE
Ketones, UA: NEGATIVE
Leukocytes,UA: NEGATIVE
Nitrite, UA: NEGATIVE
Protein,UA: NEGATIVE
RBC, UA: NEGATIVE
Specific Gravity, UA: <1.005 — ABNORMAL LOW (ref 1.005–1.030)
Urobilinogen, Ur: 1 mg/dL (ref 0.2–1.0)
pH, UA: 6.5 (ref 5.0–7.5)

## 2024-08-05 DIAGNOSIS — K080 Exfoliation of teeth due to systemic causes: Secondary | ICD-10-CM | POA: Diagnosis not present

## 2024-08-09 DIAGNOSIS — M25562 Pain in left knee: Secondary | ICD-10-CM | POA: Diagnosis not present

## 2024-08-15 ENCOUNTER — Encounter: Payer: Self-pay | Admitting: Internal Medicine

## 2024-08-15 ENCOUNTER — Ambulatory Visit: Attending: Internal Medicine | Admitting: Internal Medicine

## 2024-08-15 VITALS — BP 130/78 | HR 72 | Ht 71.0 in | Wt 258.6 lb

## 2024-08-15 DIAGNOSIS — D696 Thrombocytopenia, unspecified: Secondary | ICD-10-CM

## 2024-08-15 DIAGNOSIS — I2510 Atherosclerotic heart disease of native coronary artery without angina pectoris: Secondary | ICD-10-CM | POA: Diagnosis not present

## 2024-08-15 DIAGNOSIS — E782 Mixed hyperlipidemia: Secondary | ICD-10-CM

## 2024-08-15 DIAGNOSIS — I77810 Thoracic aortic ectasia: Secondary | ICD-10-CM | POA: Diagnosis not present

## 2024-08-15 DIAGNOSIS — I10 Essential (primary) hypertension: Secondary | ICD-10-CM

## 2024-08-15 DIAGNOSIS — M79605 Pain in left leg: Secondary | ICD-10-CM

## 2024-08-15 DIAGNOSIS — M79604 Pain in right leg: Secondary | ICD-10-CM

## 2024-08-15 NOTE — Progress Notes (Signed)
  Cardiology Office Note:  .   Date:  08/15/2024  ID:  Warren Hill, DOB June 26, 1956, MRN [id number] PCP: Levora Reyes SAUNDERS, MD  [facility] Providers Cardiologist:  Soyla DELENA Merck, MD    History of Present Illness: .   Warren Hill is a 68 y.o. male.  Discussed the use of AI scribe software for clinical note transcription with the patient, who gave verbal consent to proceed.  History of Present Illness  The patient, with a history of hypertension, hyperlipidemia, and knee arthritis, presents for a routine check-up. He reports a recent weight gain, which may be secondary to increased knee pain. He received a cortisone shot for her knee pain. He manages her knee pain with ice and moist heat, especially after activities like working in the kitchen.  He has a history of leg cramps, which he associates with a past COVID-19 infection. He has tried massage therapy and uses turmeric in his diet for its potential health benefits, spice used in food not a supplement. He is considering magnesium supplements to alleviate the cramps. He is on Crestor  (rosuvastatin ) for cholesterol management, which he takes at night. He has experienced leg cramps, which he initially thought might be related to Crestor , but discontinuing the medication did not alleviate the cramps.    ROS: negative except per HPI above.  Studies Reviewed: .        Results  Risk Assessment/Calculations:       Physical Exam:   VS:  BP 130/78   Pulse 72   Ht 5' 11 (1.803 m)   Wt 258 lb 9.6 oz (117.3 kg)   SpO2 99%   BMI 36.07 kg/m    Wt Readings from Last 3 Encounters:  08/15/24 258 lb 9.6 oz (117.3 kg)  07/01/24 255 lb (115.7 kg)  06/17/24 256 lb 4.8 oz (116.3 kg)     Physical Exam  Constitutional: No acute distress Eyes: sclera non-icteric, normal conjunctiva and lids ENMT: normal dentition, moist mucous membranes Cardiovascular: regular rhythm, normal rate, no murmur. S1 and S2 normal. No jugular venous  distention.  Respiratory: clear to auscultation bilaterally GI : normal bowel sounds, soft and nontender. No distention.   MSK: extremities warm, well perfused. No edema.  NEURO: grossly nonfocal exam, moves all extremities. PSYCH: alert and oriented x 3, normal mood and affect.    ASSESSMENT AND PLAN: .    Assessment and Plan Assessment & Plan  Borderline aortic root dilation under surveillance Aortic root dilation previously noted as borderline. Continued surveillance planned. - Order echocardiogram in six months to monitor aortic root dilation.  HLD Mild-moderate CAD, asymptomatic. Managed with rosuvastatin . LDL slightly higher than desired. Resumed Crestor  after leg cramps did not resolve with cessation. - Continue rosuvastatin  (Crestor ). - Monitor LDL levels and adjust treatment as necessary.  Chronic leg pain, etiology unclear Chronic leg pain and swelling with unclear etiology. Previous workup showed no significant abnormalities. Symptoms include cramping pain. - Recommend magnesium supplementation, considering potential bowel movement effects.  Thrombocytopenia, stable Stable thrombocytopenia with no significant changes. Platelet count slightly low but stable.   Soyla Merck, MD, FACC

## 2024-08-15 NOTE — Patient Instructions (Signed)
 Medication Instructions:  NO CHANGES  *If you need a refill on your cardiac medications before your next appointment, please call your pharmacy*   Testing/Procedures: Your physician has requested that you have an echocardiogram due Feb 2026. Echocardiography is a painless test that uses sound waves to create images of your heart. It provides your doctor with information about the size and shape of your heart and how well your heart's chambers and valves are working. This procedure takes approximately one hour. There are no restrictions for this procedure. Please do NOT wear cologne, perfume, aftershave, or lotions (deodorant is allowed). Please arrive 15 minutes prior to your appointment time.  Please note: We ask at that you not bring children with you during ultrasound (echo/ vascular) testing. Due to room size and safety concerns, children are not allowed in the ultrasound rooms during exams. Our front office staff cannot provide observation of children in our lobby area while testing is being conducted. An adult accompanying a patient to their appointment will only be allowed in the ultrasound room at the discretion of the ultrasound technician under special circumstances. We apologize for any inconvenience.   Follow-Up: At [facility], you and your health needs are our priority.  As part of our continuing mission to provide you with exceptional heart care, our providers are all part of one team.  This team includes your primary Cardiologist (physician) and Advanced Practice Providers or APPs (Physician Assistants and Nurse Practitioners) who all work together to provide you with the care you need, when you need it.  Your next appointment:    Feb/March 2026 with Dr. Loni  We recommend signing up for the patient portal called MyChart.  Sign up information is provided on this After Visit Summary.  MyChart is used to connect with patients for Virtual Visits (Telemedicine).   Patients are able to view lab/test results, encounter notes, upcoming appointments, etc.  Non-urgent messages can be sent to your provider as well.   To learn more about what you can do with MyChart, go to [email or web address].

## 2024-09-09 ENCOUNTER — Ambulatory Visit: Admitting: Internal Medicine

## 2025-02-17 ENCOUNTER — Ambulatory Visit (HOSPITAL_COMMUNITY)

## 2025-02-24 ENCOUNTER — Ambulatory Visit: Admitting: Internal Medicine

## 2025-03-10 ENCOUNTER — Encounter: Admitting: Family Medicine

## 2025-04-01 ENCOUNTER — Encounter
